# Patient Record
Sex: Female | Born: 1980 | Race: Black or African American | Hispanic: No | Marital: Married | Smoking: Never smoker
Health system: Southern US, Community
[De-identification: ages and names within clinical notes are randomized; demographics above are authoritative.]

## PROBLEM LIST (undated history)

## (undated) DIAGNOSIS — S060X9A Concussion with loss of consciousness of unspecified duration, initial encounter: Secondary | ICD-10-CM

## (undated) DIAGNOSIS — M5416 Radiculopathy, lumbar region: Secondary | ICD-10-CM

## (undated) DIAGNOSIS — F4541 Pain disorder exclusively related to psychological factors: Secondary | ICD-10-CM

## (undated) DIAGNOSIS — R519 Headache, unspecified: Secondary | ICD-10-CM

## (undated) DIAGNOSIS — S060XAA Concussion with loss of consciousness status unknown, initial encounter: Secondary | ICD-10-CM

## (undated) DIAGNOSIS — D649 Anemia, unspecified: Secondary | ICD-10-CM

## (undated) DIAGNOSIS — I2699 Other pulmonary embolism without acute cor pulmonale: Secondary | ICD-10-CM

## (undated) DIAGNOSIS — K859 Acute pancreatitis without necrosis or infection, unspecified: Secondary | ICD-10-CM

## (undated) DIAGNOSIS — Z86018 Personal history of other benign neoplasm: Secondary | ICD-10-CM

## (undated) DIAGNOSIS — K5790 Diverticulosis of intestine, part unspecified, without perforation or abscess without bleeding: Secondary | ICD-10-CM

## (undated) DIAGNOSIS — N281 Cyst of kidney, acquired: Secondary | ICD-10-CM

## (undated) HISTORY — PX: BREAST CYST EXCISION: SHX579

## (undated) HISTORY — DX: Diverticulosis of intestine, part unspecified, without perforation or abscess without bleeding: K57.90

## (undated) HISTORY — PX: ABDOMINAL HYSTERECTOMY: SHX81

## (undated) HISTORY — DX: Cyst of kidney, acquired: N28.1

## (undated) HISTORY — DX: Headache, unspecified: R51.9

## (undated) HISTORY — PX: OTHER SURGICAL HISTORY: SHX169

## (undated) HISTORY — DX: Radiculopathy, lumbar region: M54.16

---
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
## 2006-03-19 ENCOUNTER — Emergency Department (HOSPITAL_COMMUNITY): Admission: EM | Admit: 2006-03-19 | Discharge: 2006-03-19 | Payer: Self-pay | Admitting: Emergency Medicine

## 2006-03-22 IMAGING — US US OB COMP LESS 14 WK
1 series · 14 of 18 positions shown · non-contrast
Comparison: none

CLINICAL DATA: 9 weeks, unable to keep fluids down, dehydrated.
 OBSTETRICAL ULTRASOUND <14 WKS:
TECHNIQUE: Transabdominal ultrasound was performed for evaluation of the gestation as well as the maternal uterus and adnexal regions.

[Series 1: us ob comp less 14 wks · 14 of 18 slices shown]
[im 1/18]
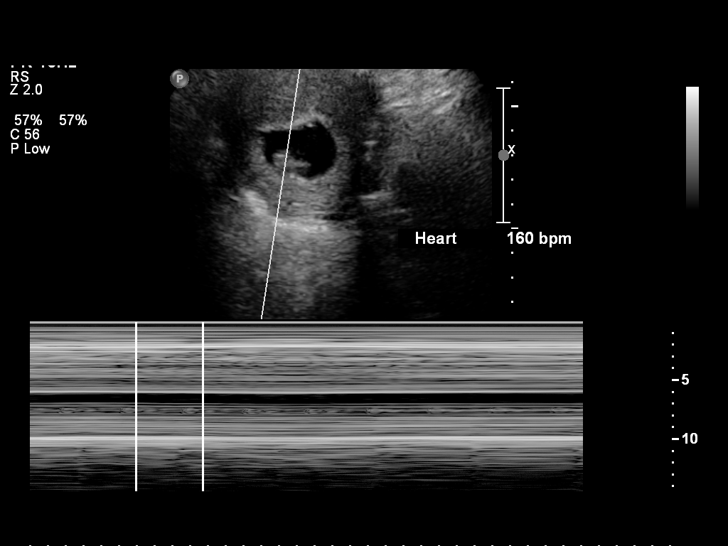
[im 2/18]
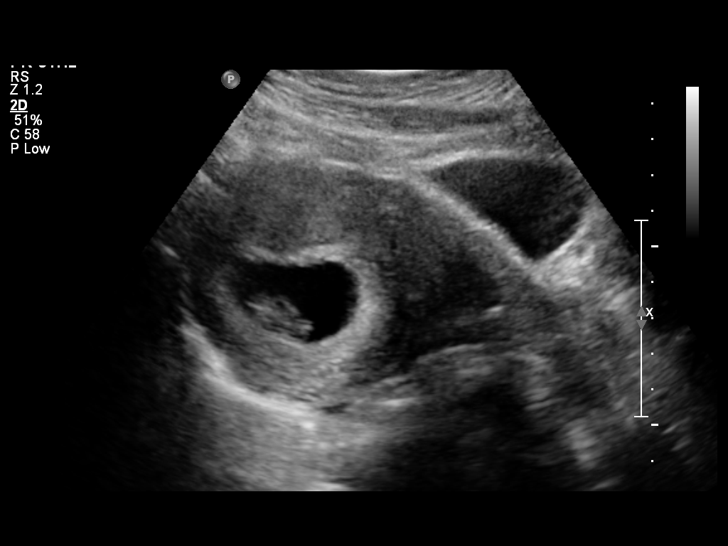
[im 4/18]
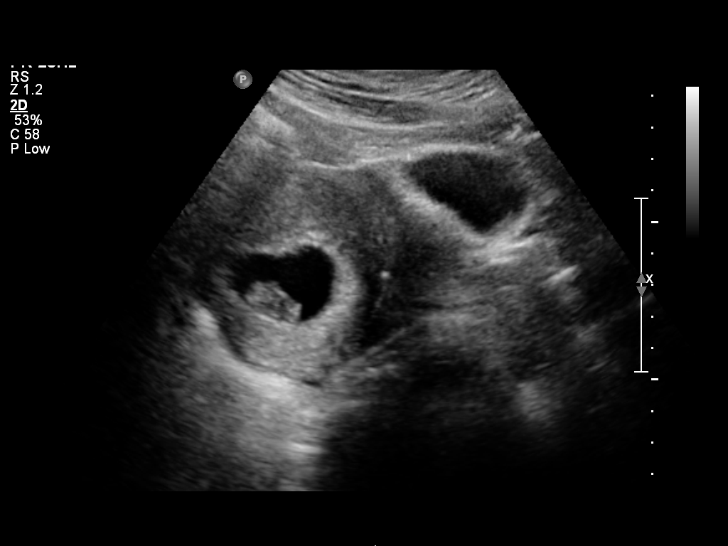
[im 5/18]
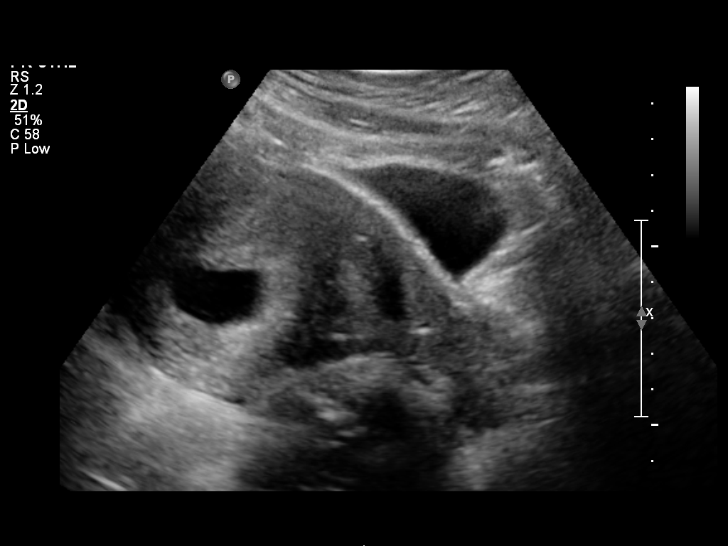
[im 6/18]
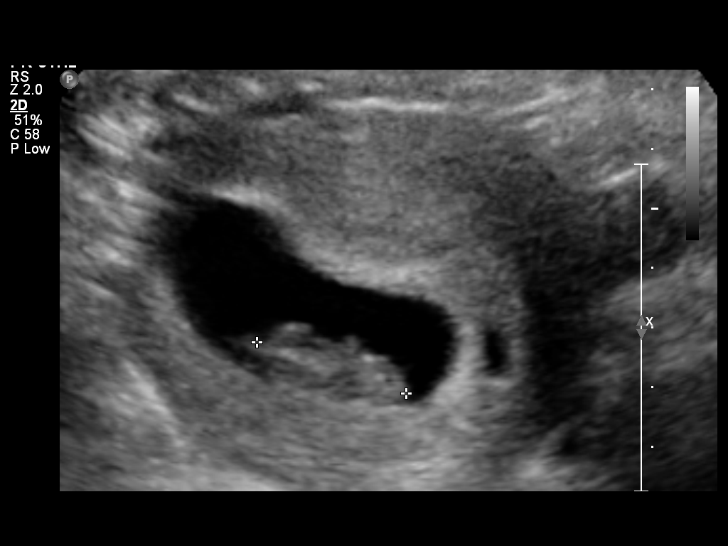
[im 8/18]
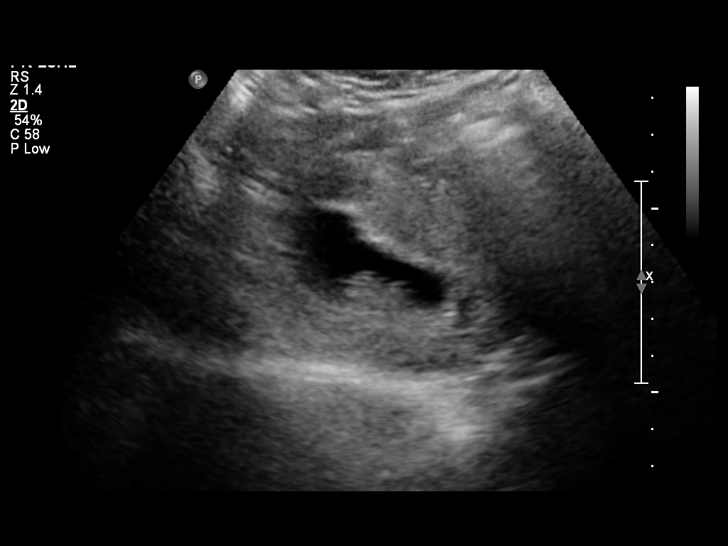
[im 9/18]
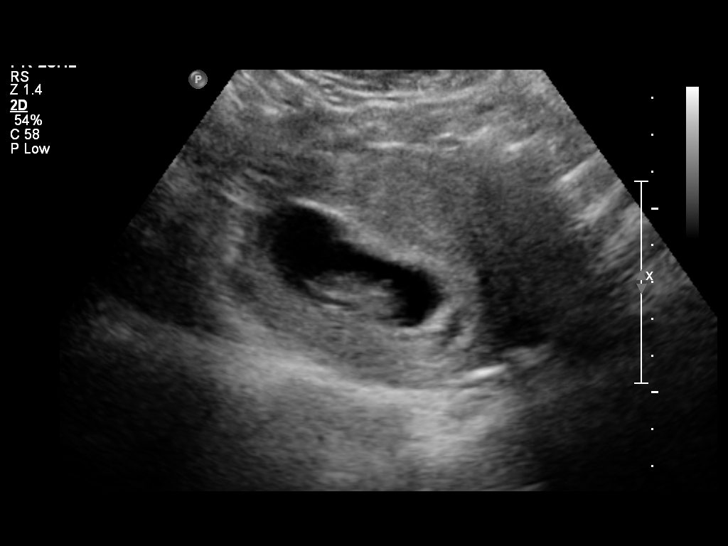
[im 10/18]
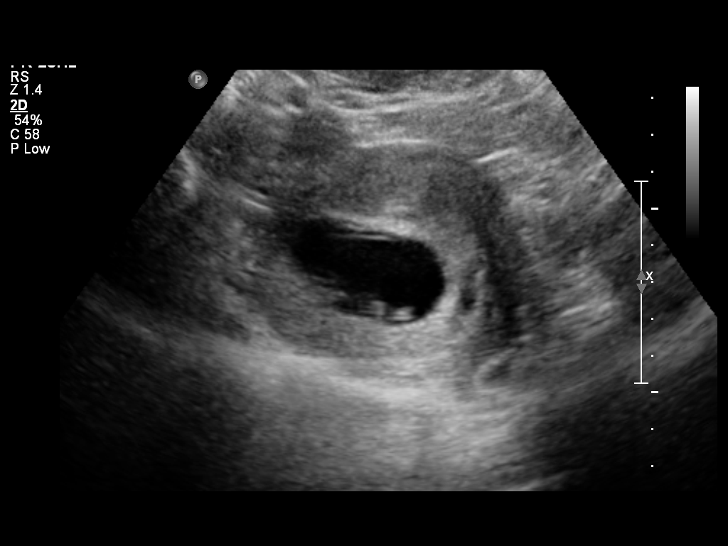
[im 11/18]
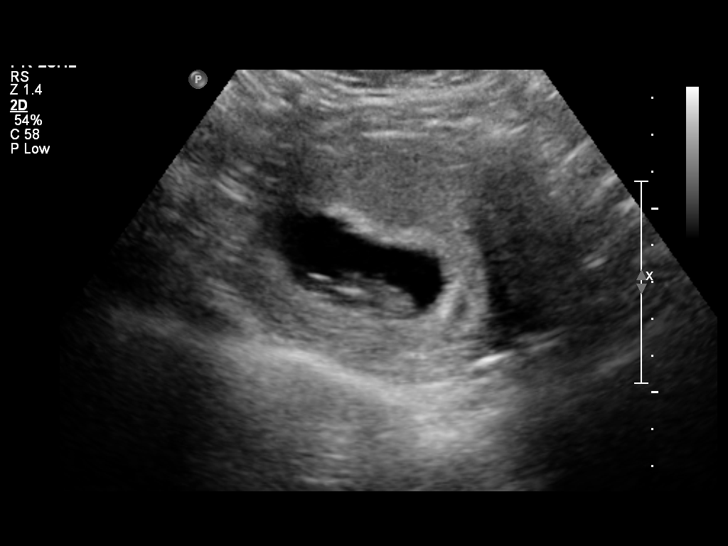
[im 13/18]
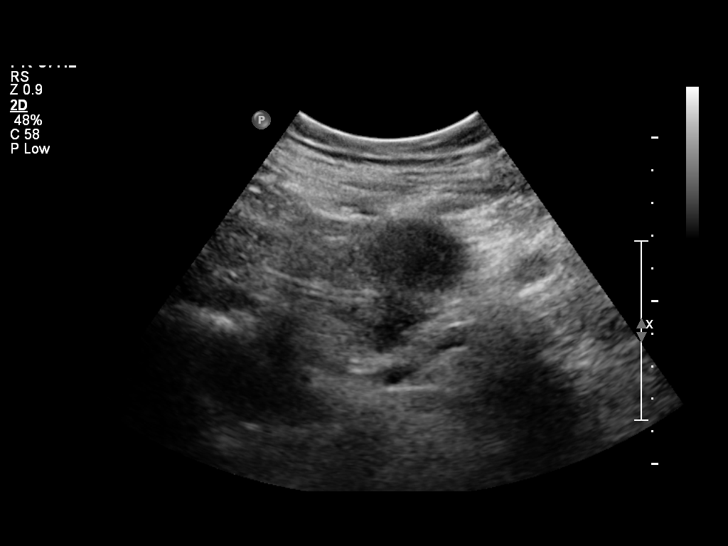
[im 14/18]
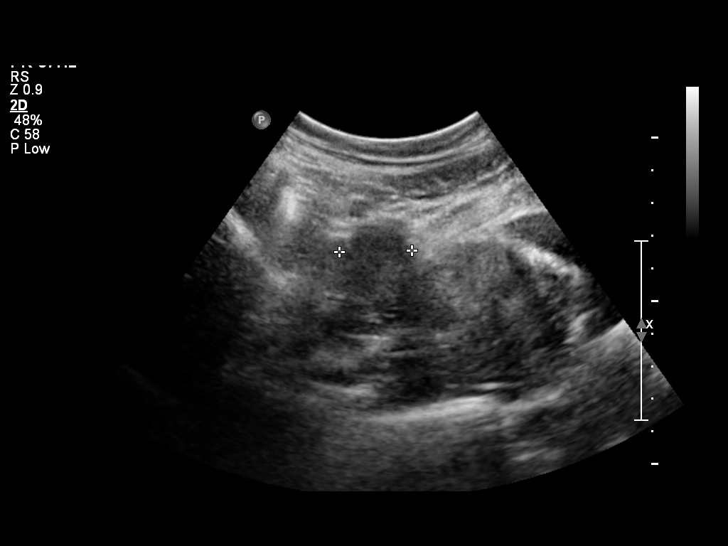
[im 15/18]
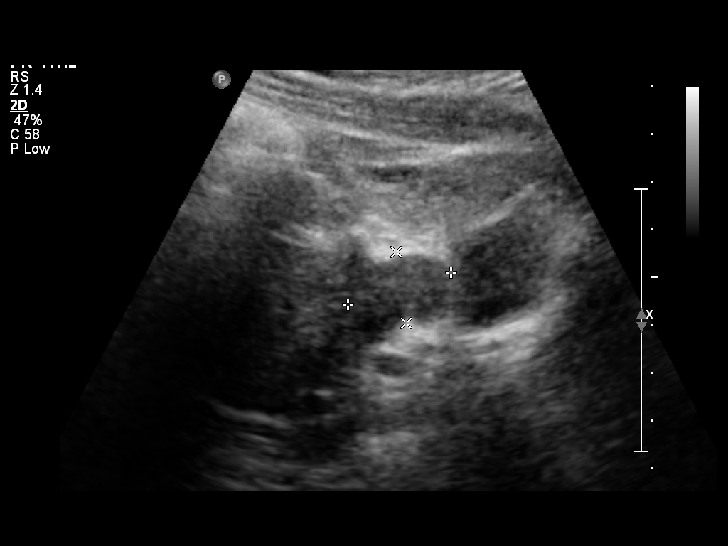
[im 17/18]
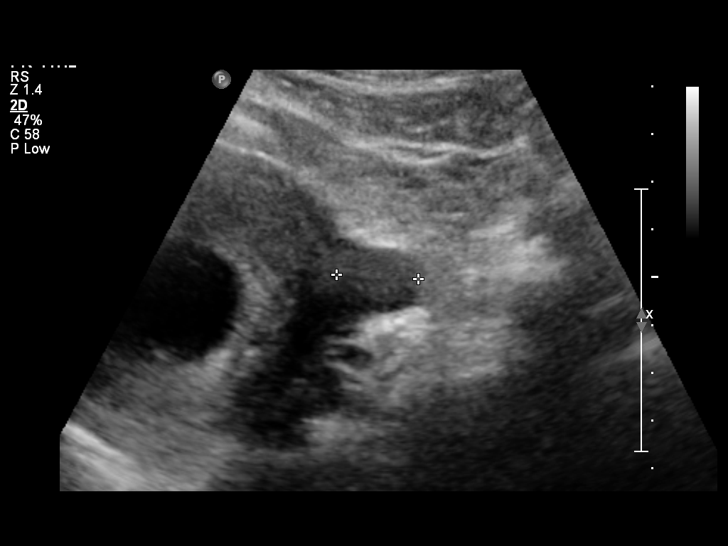
[im 18/18]
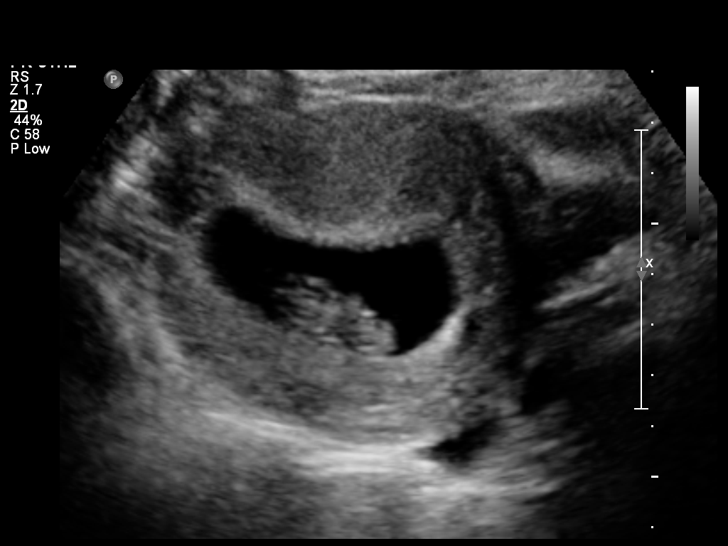

[14 of 18 positions shown; findings below may reference images not displayed]

FINDINGS: A single intrauterine gestational sac containing a fetal pole with crown-rump length 25.7 mm.  This corresponds to a 9 week 2 day gestation and an ultrasonic EDC of [DATE].  Objectively normal volume of amniotic fluid.  Fetal cardiac rate 160 BPM.  Cervix closed.
IMPRESSION: Single living intrauterine gestation.  Fetal age 9 weeks 2 days.  Ultrasonic EDC [DATE].

## 2006-03-23 ENCOUNTER — Inpatient Hospital Stay (HOSPITAL_COMMUNITY): Admission: AD | Admit: 2006-03-23 | Discharge: 2006-03-25 | Payer: Self-pay | Admitting: Obstetrics and Gynecology

## 2006-03-23 IMAGING — US US ABDOMEN COMPLETE
1 series · 13 of 25 positions shown · non-contrast
Comparison: none

CLINICAL DATA: 9 weeks estimated gestational age with dehydration and possible pyelonephritis.  Nausea and vomiting.
 ABDOMEN ULTRASOUND:
TECHNIQUE: Complete abdominal ultrasound examination was performed including evaluation of the liver, gallbladder, bile ducts, pancreas, kidneys, spleen, IVC, and abdominal aorta.

[Series 1: us abdomen complete · 0.16mm/px · 13 of 77 slices shown]
[im 1/77]
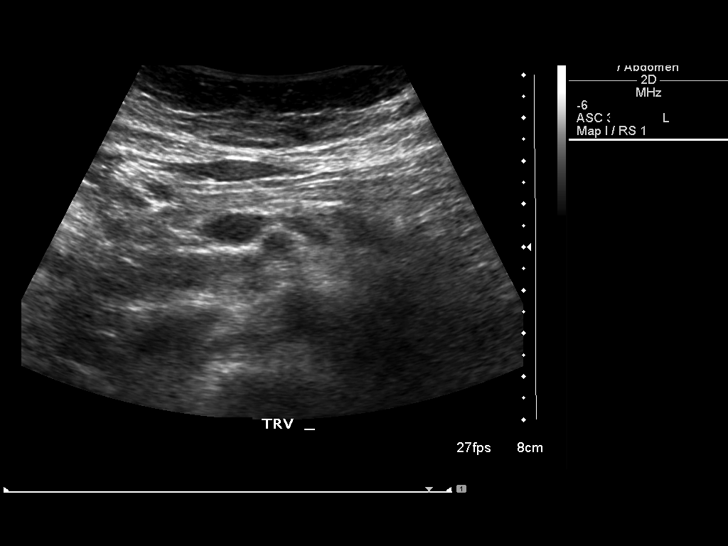
[im 7/77]
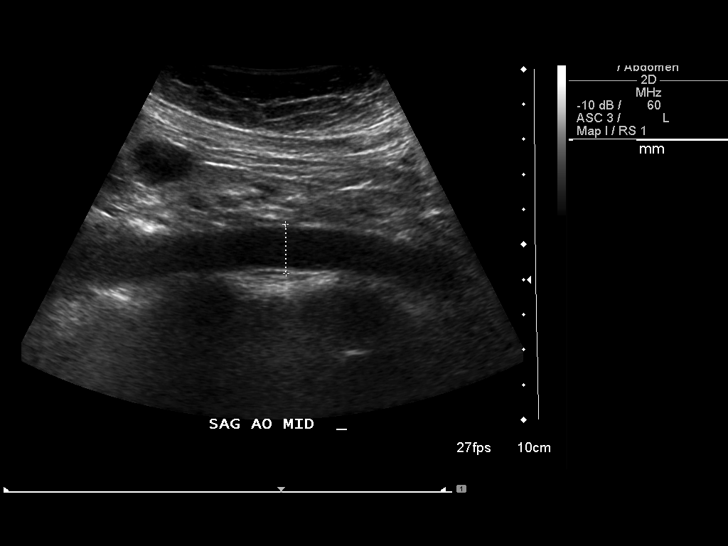
[im 13/77]
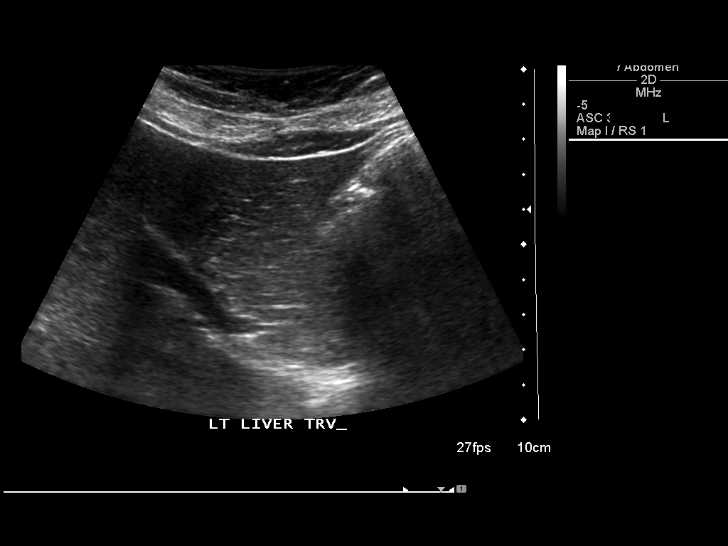
[im 20/77]
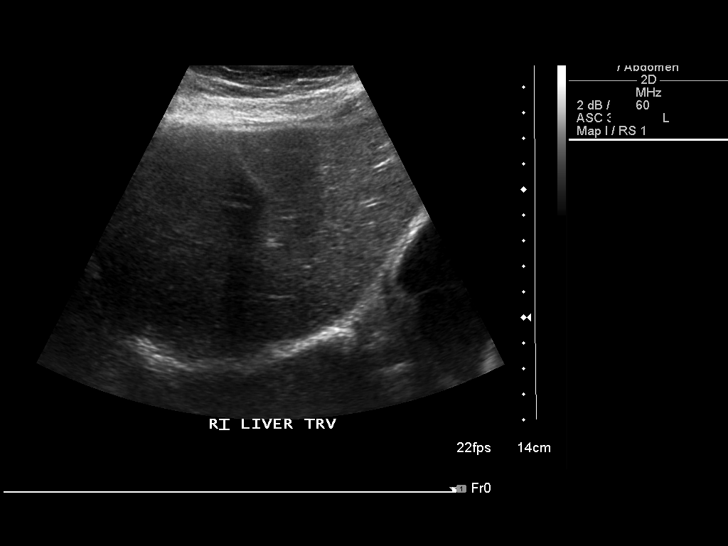
[im 26/77]
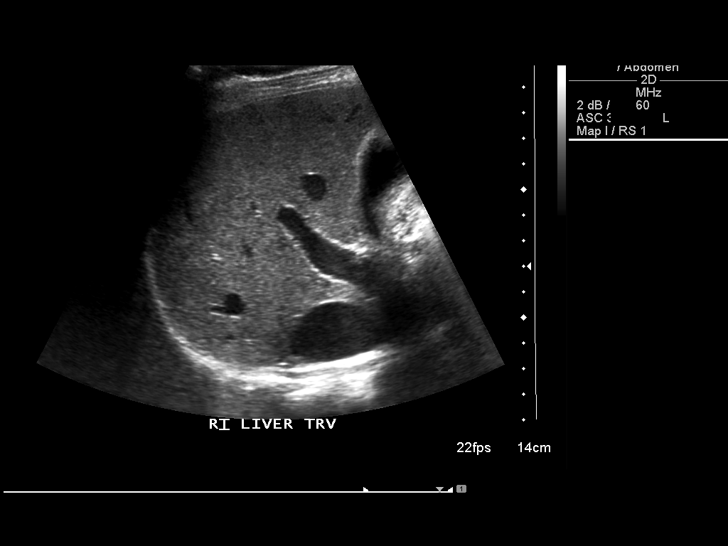
[im 32/77]
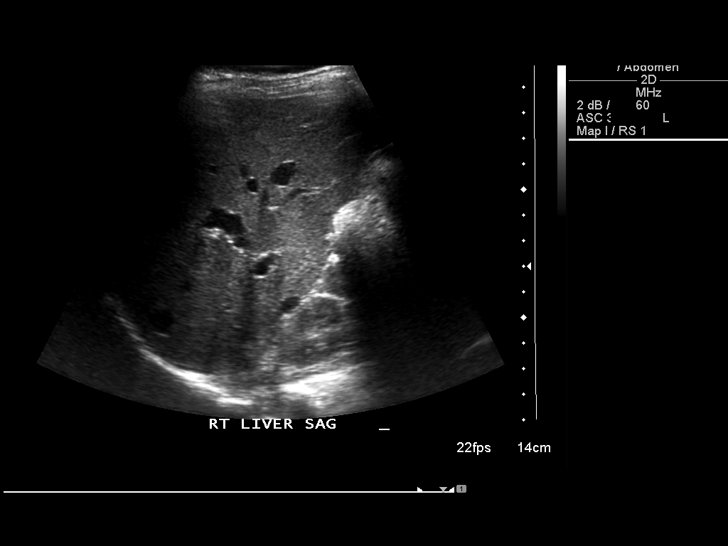
[im 39/77]
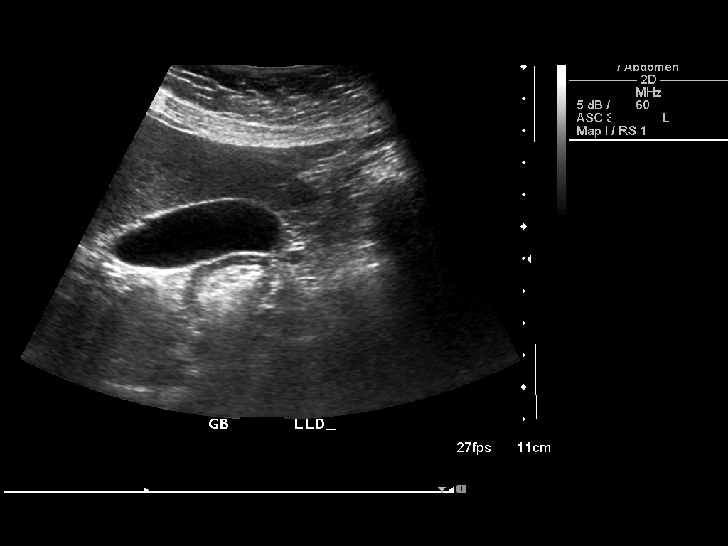
[im 45/77]
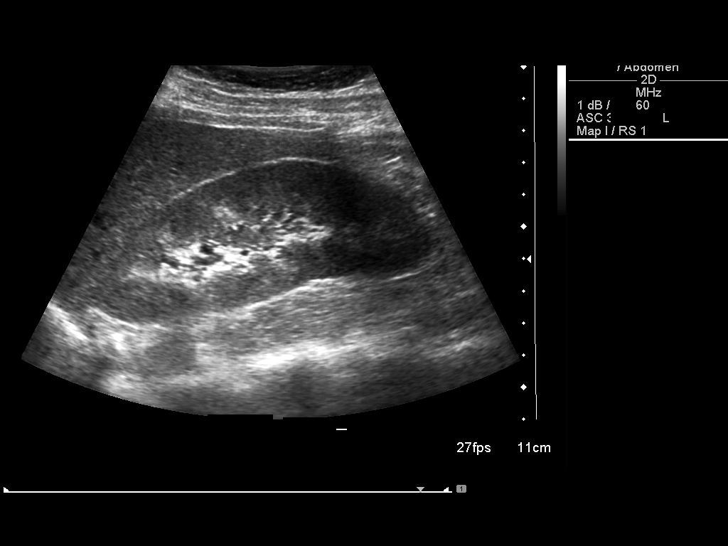
[im 51/77]
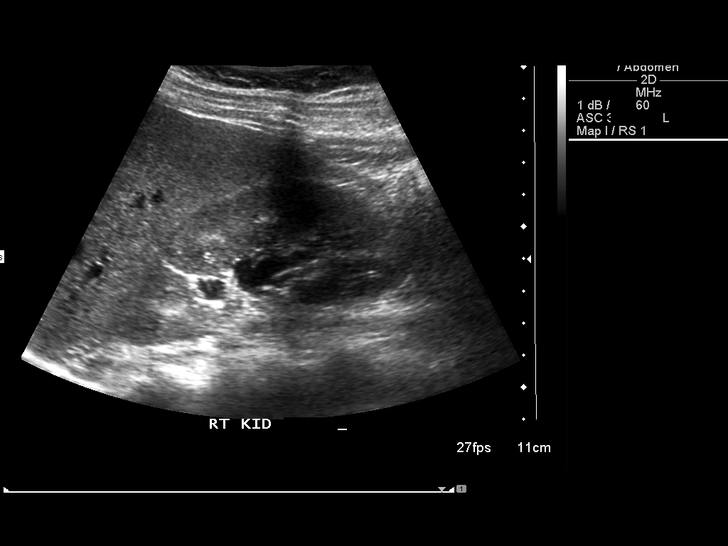
[im 58/77]
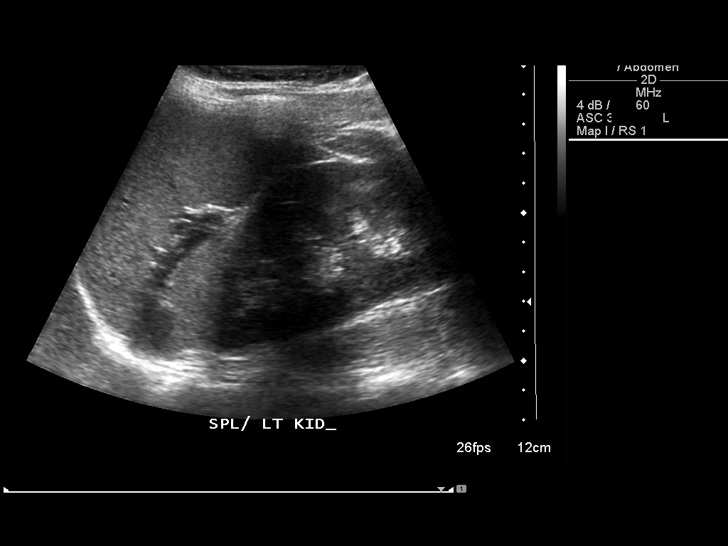
[im 64/77]
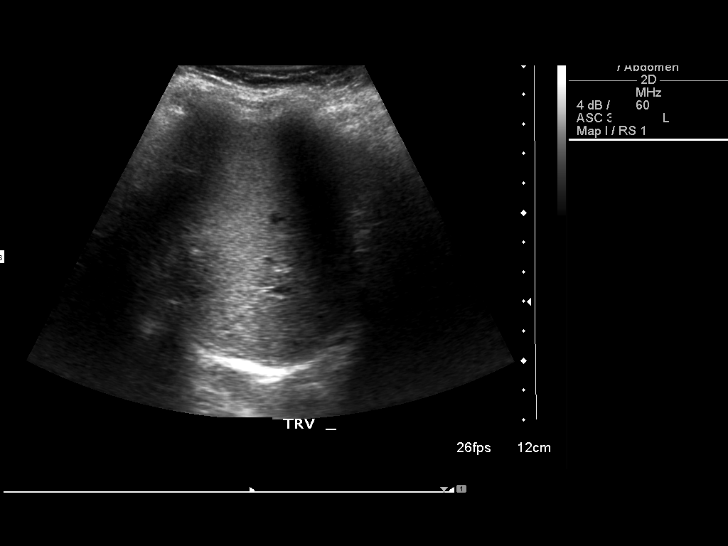
[im 70/77]
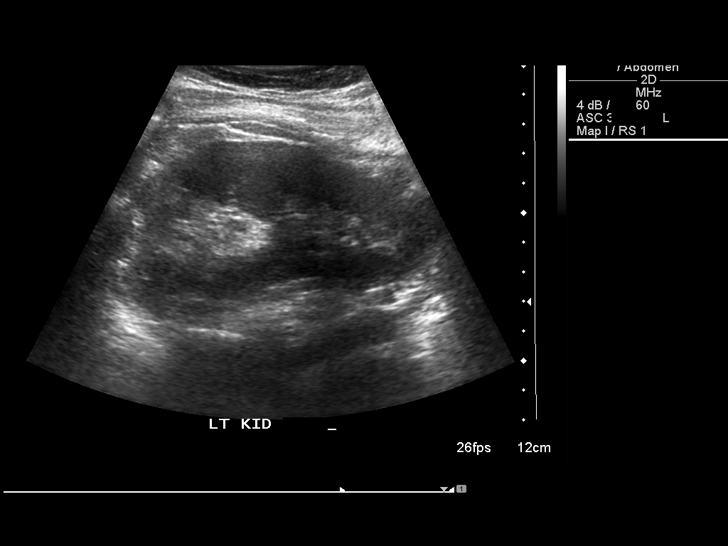
[im 77/77]
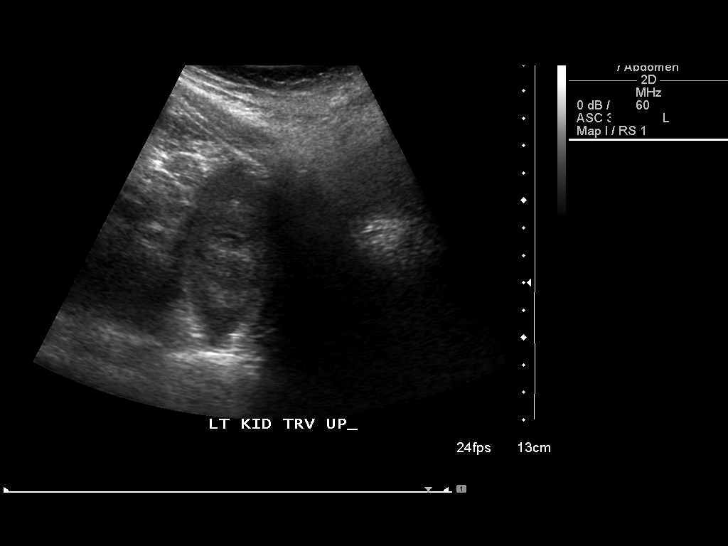

[13 of 25 positions shown; findings below may reference images not displayed]

FINDINGS: The liver parenchyma is homogeneous in echotexture without evidence for focal parenchymal abnormality or intrahepatic ductal dilatation.  The gallbladder is well distended and shows no evidence for intraluminal stones or sludge.  No pericholecystic fluid or gallbladder wall thickening is seen.  The common bile duct measures 4.7 mm in AP width, and this is within normal limits for size.  
 Both kidneys have a normal appearance with the right kidney having a sagittal length of 10.7 cm and the left kidney having a sagittal length of 10.3 cm.  No focal parenchymal abnormalities or signs of hydronephrosis are evident.  The spleen is normal in size and echotexture.  The pancreatic tail is incompletely visualized due to shadowing from overlying bowel gas.  The remainder of the pancreas remains within normal limits.  No evidence for intraabdominal fluid is seen.  The abdominal aorta is normal in caliber and the proximal inferior vena cava is unremarkable.
IMPRESSION: Incomplete visualization of the pancreatic tail.  Otherwise normal abdominal ultrasound.

## 2006-07-24 ENCOUNTER — Ambulatory Visit (HOSPITAL_COMMUNITY): Admission: RE | Admit: 2006-07-24 | Discharge: 2006-07-24 | Payer: Self-pay | Admitting: Obstetrics and Gynecology

## 2006-07-24 IMAGING — US US OB DETAIL+14 WK
1 series · 14 of 28 positions shown · non-contrast
Comparison: none

OBSTETRICAL ULTRASOUND:
 This ultrasound was performed in The [HOSPITAL], and the [facility] report will be stored to [REDACTED] PACS.

[Series 1: us ob detail+14 wk · 14 of 71 slices shown]
[im 3/71]
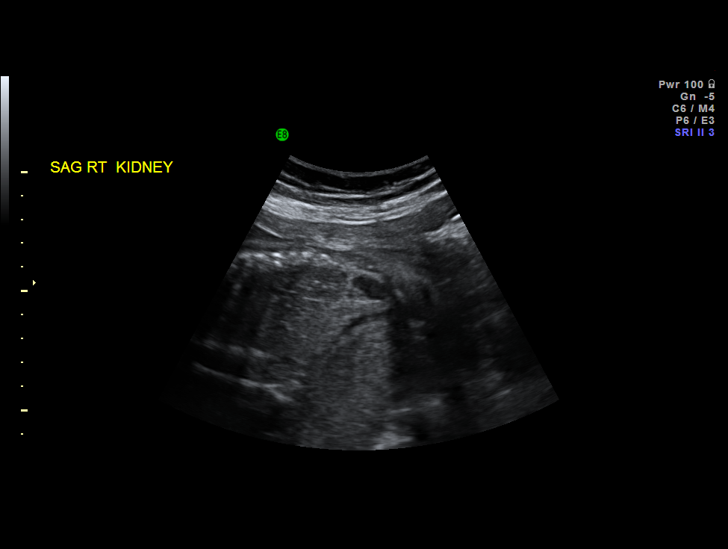
[im 8/71]
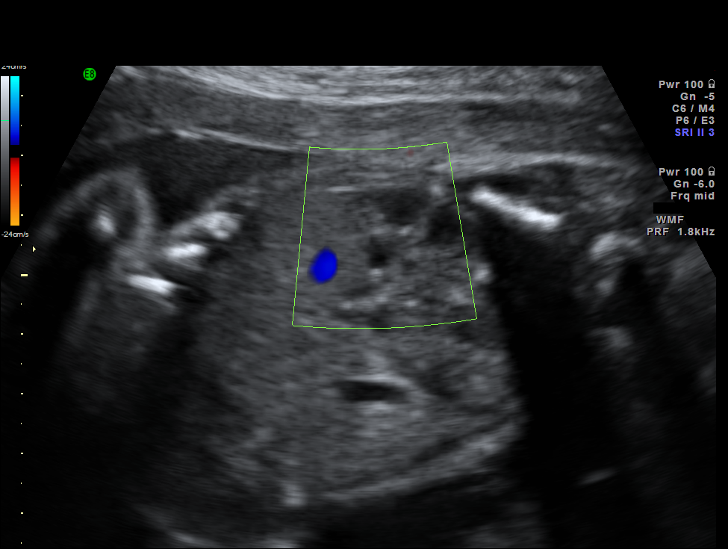
[im 13/71]
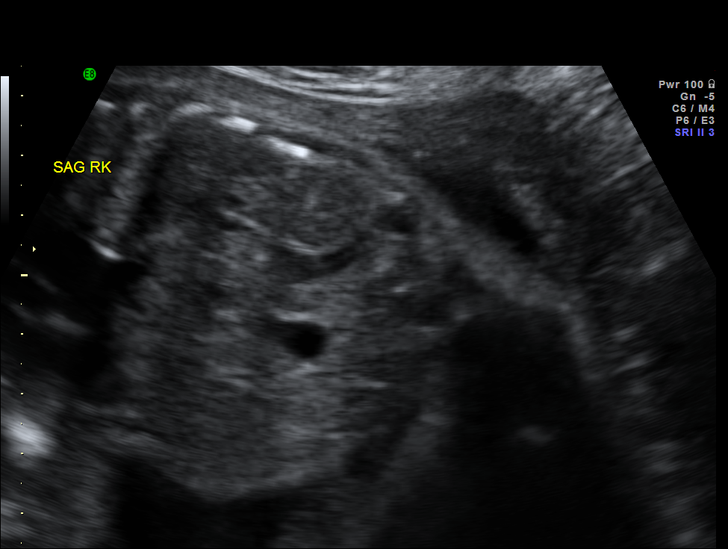
[im 19/71]
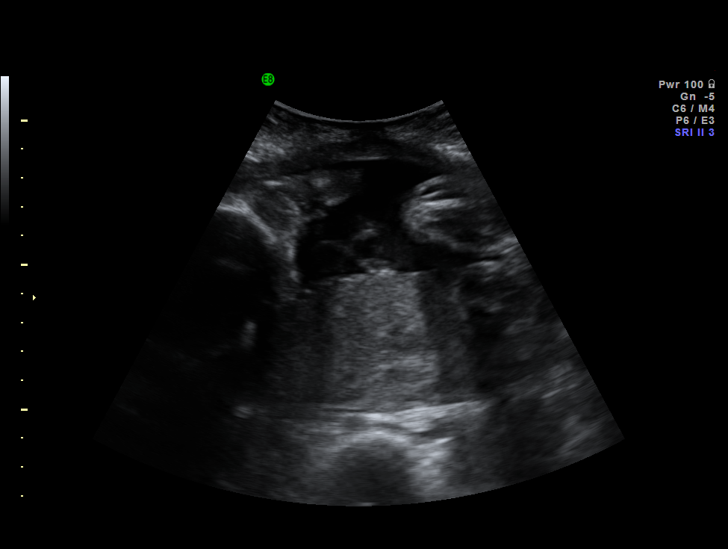
[im 24/71]
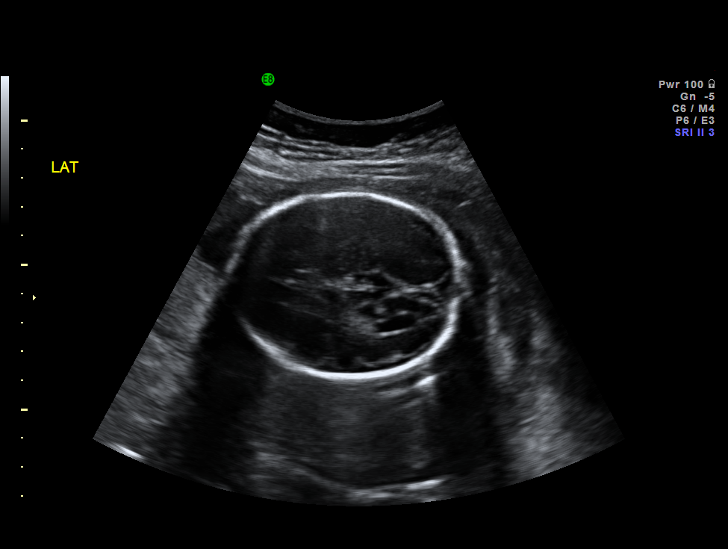
[im 29/71]
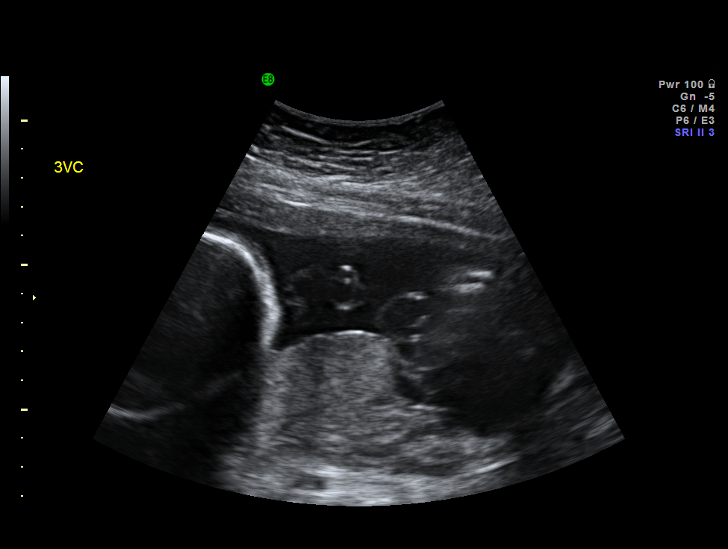
[im 34/71]
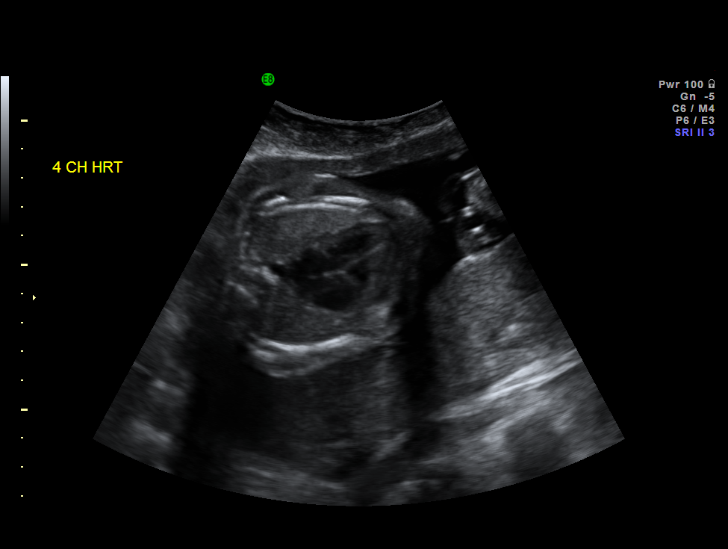
[im 39/71]
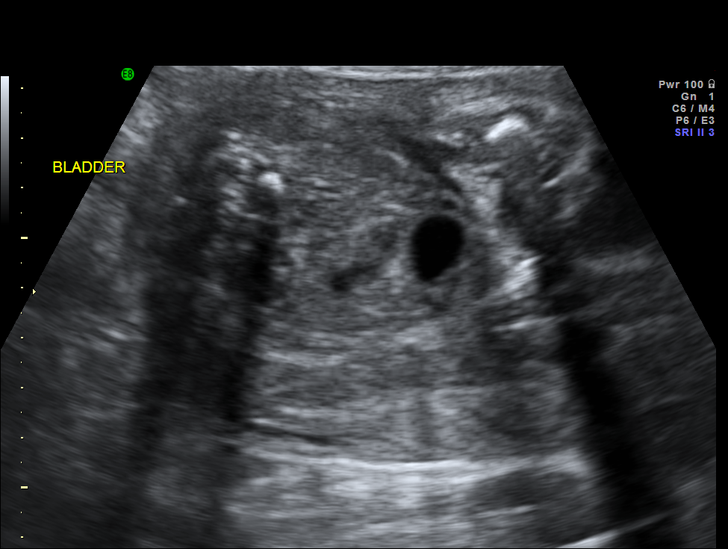
[im 45/71]
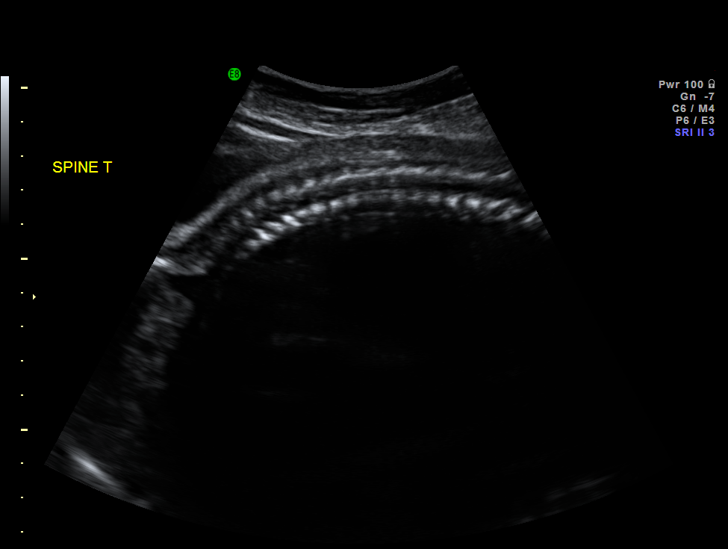
[im 50/71]
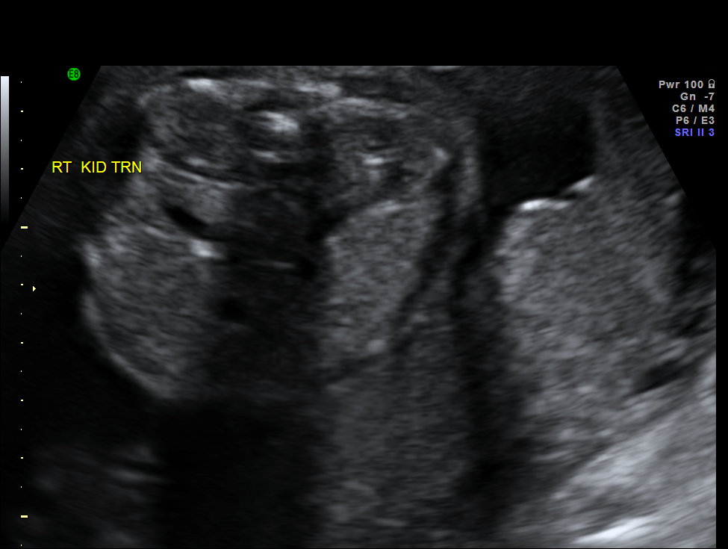
[im 55/71]
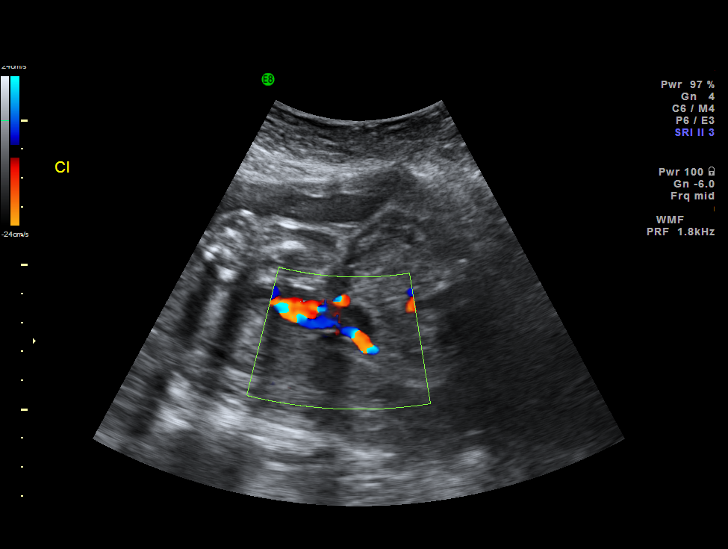
[im 60/71]
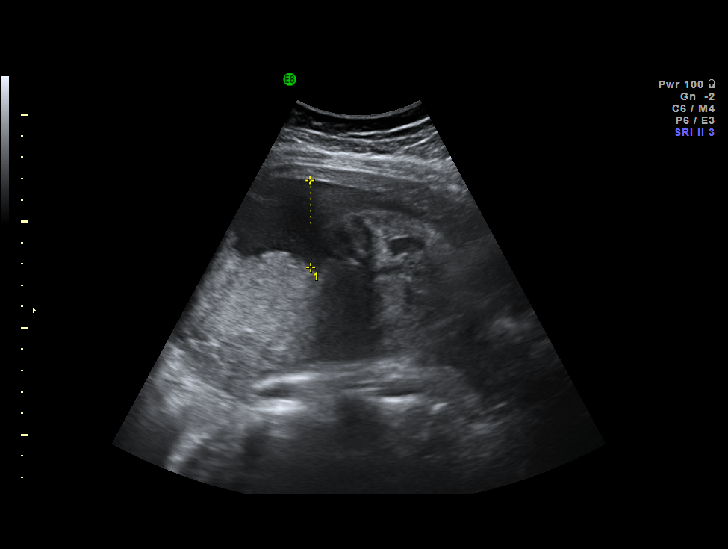
[im 65/71]
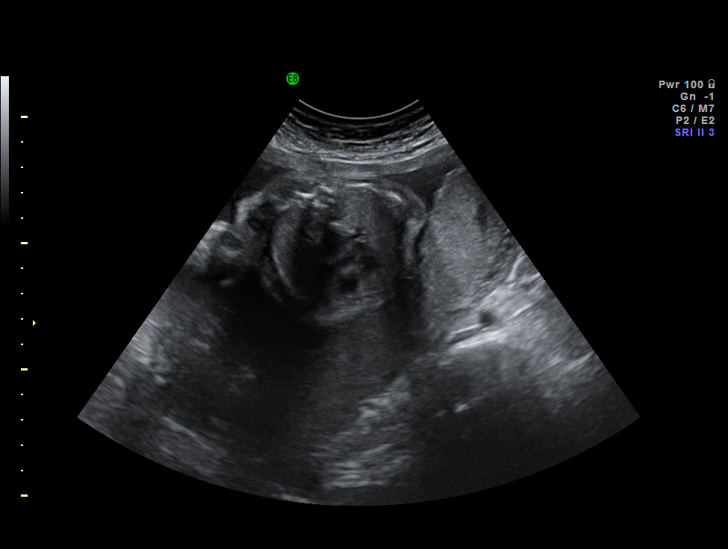
[im 71/71]
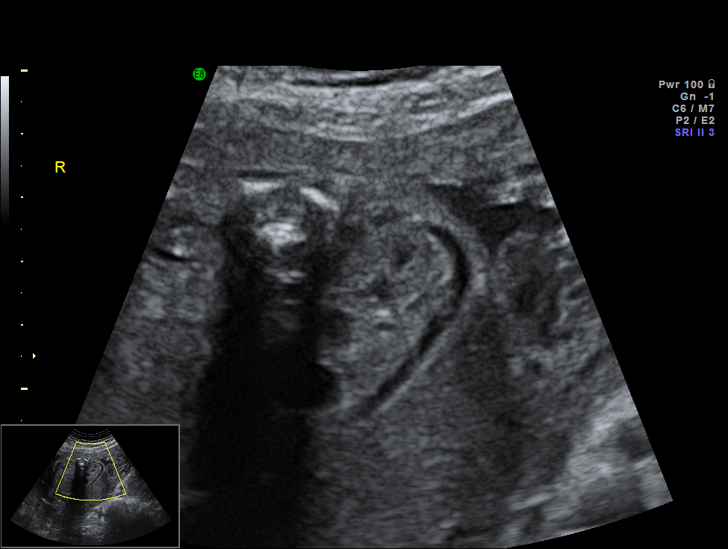

[14 of 28 positions shown; findings below may reference images not displayed]

IMPRESSION: The [facility] report has also been faxed to the ordering physician.

## 2006-08-22 ENCOUNTER — Ambulatory Visit (HOSPITAL_COMMUNITY): Admission: RE | Admit: 2006-08-22 | Discharge: 2006-08-22 | Payer: Self-pay | Admitting: Obstetrics and Gynecology

## 2006-08-22 IMAGING — US US OB FOLLOW-UP
1 series · 14 of 28 positions shown · non-contrast
Comparison: none

OBSTETRICAL ULTRASOUND:
 This ultrasound was performed in The [HOSPITAL], and the [facility] report will be stored to [REDACTED] PACS.

[Series 1: us ob follow-up · 14 of 46 slices shown]
[im 2/46]
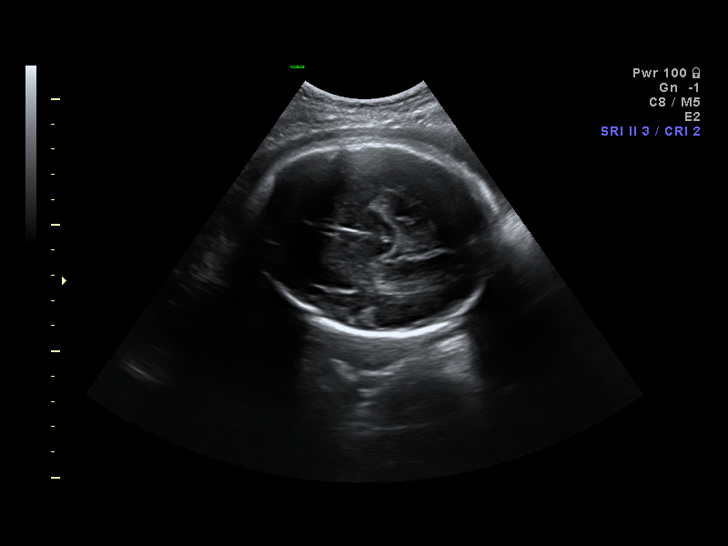
[im 6/46]
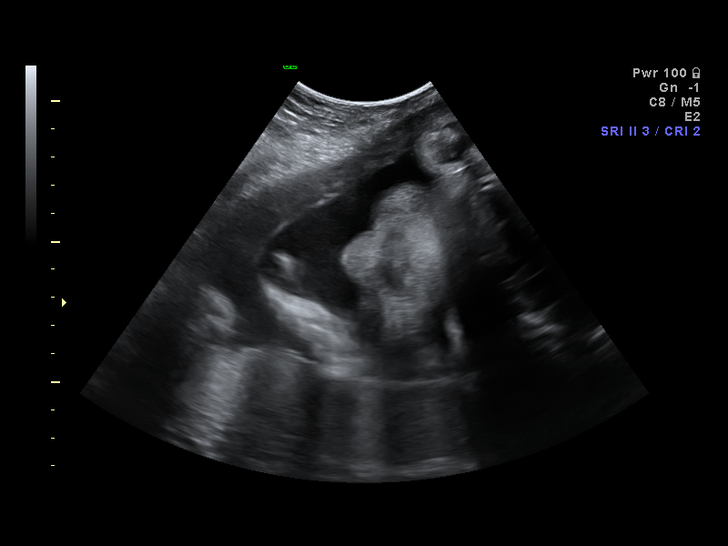
[im 9/46]
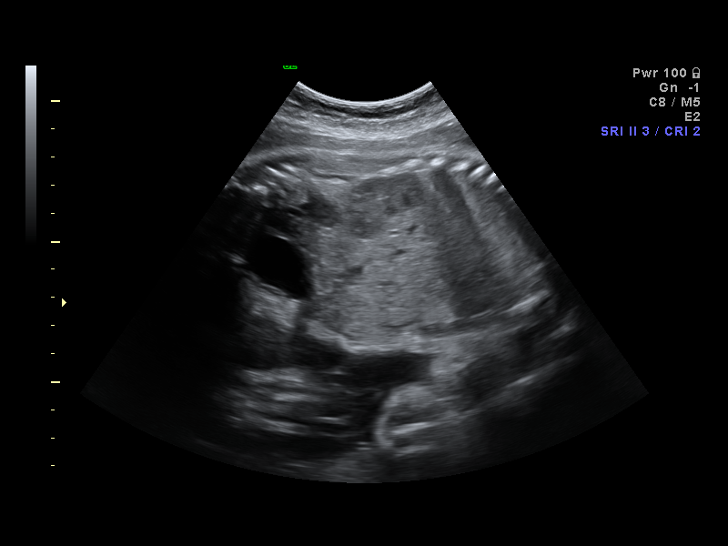
[im 12/46]
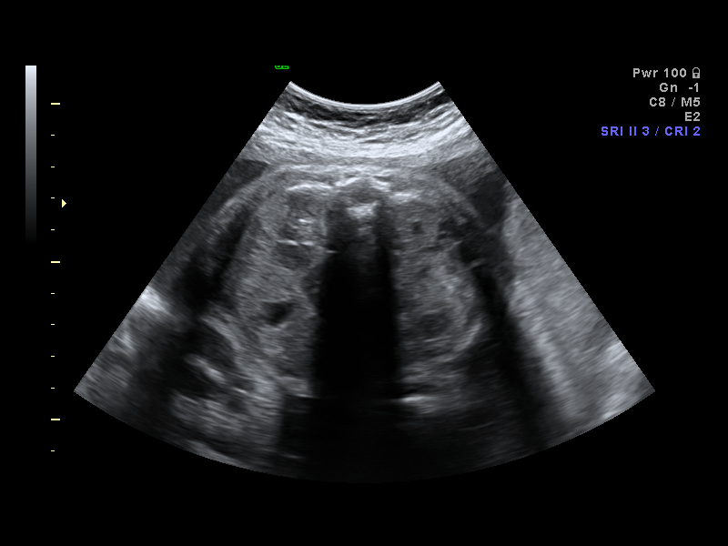
[im 16/46]
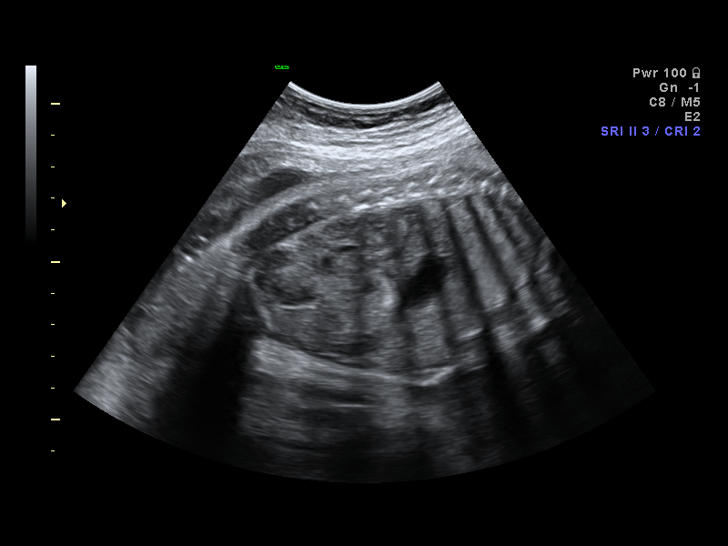
[im 19/46]
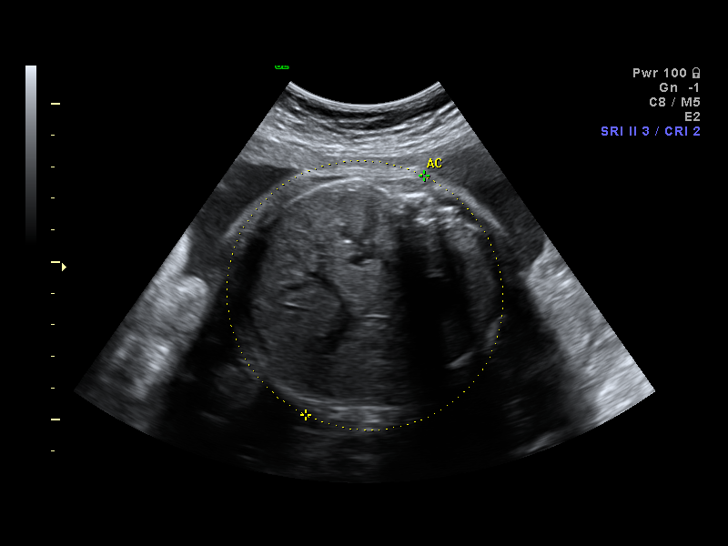
[im 22/46]
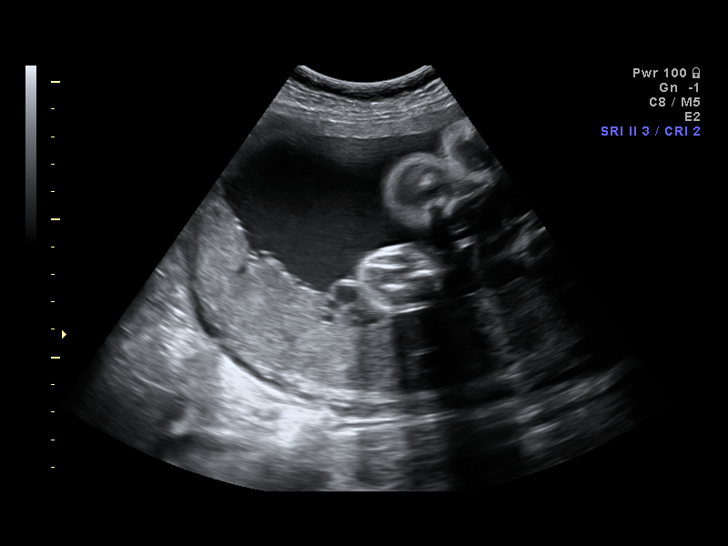
[im 26/46]
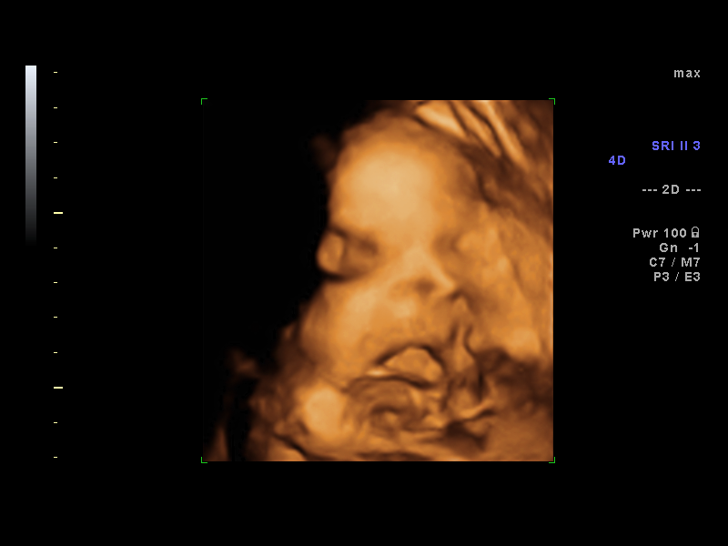
[im 29/46]
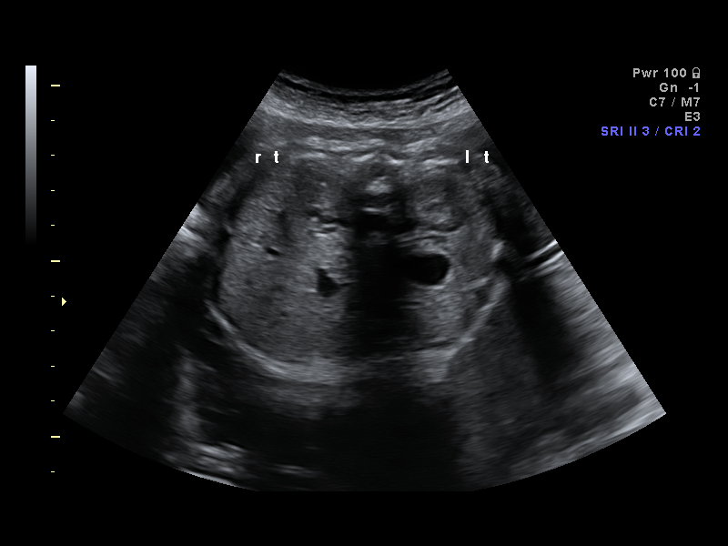
[im 32/46]
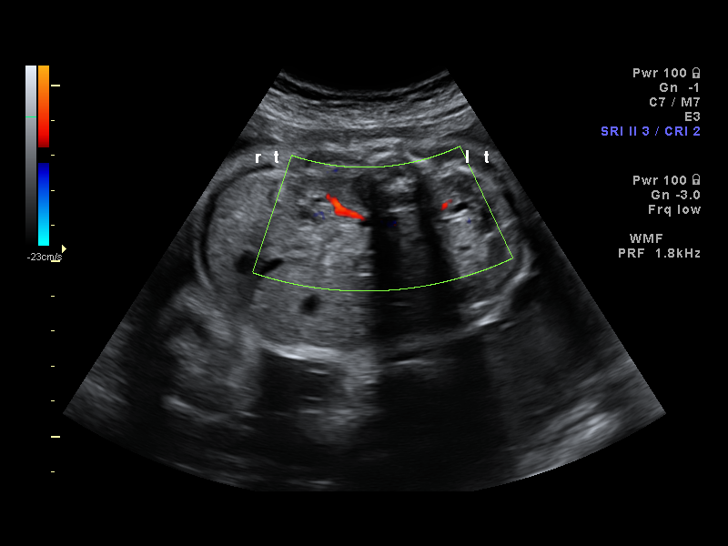
[im 36/46]
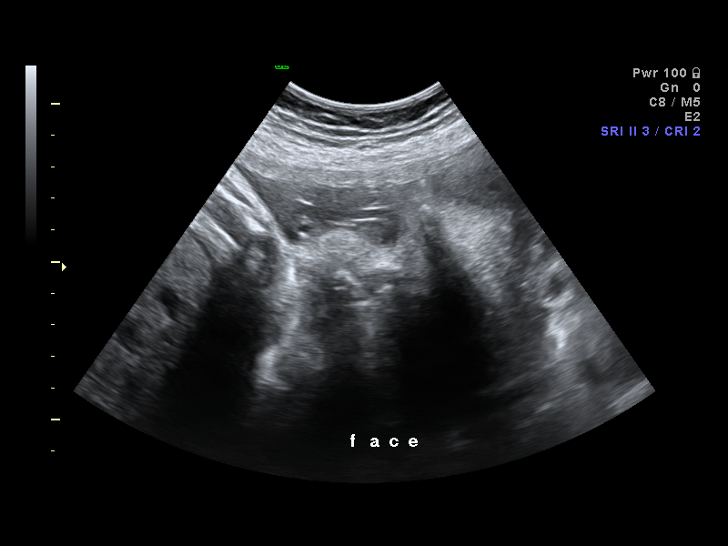
[im 39/46]
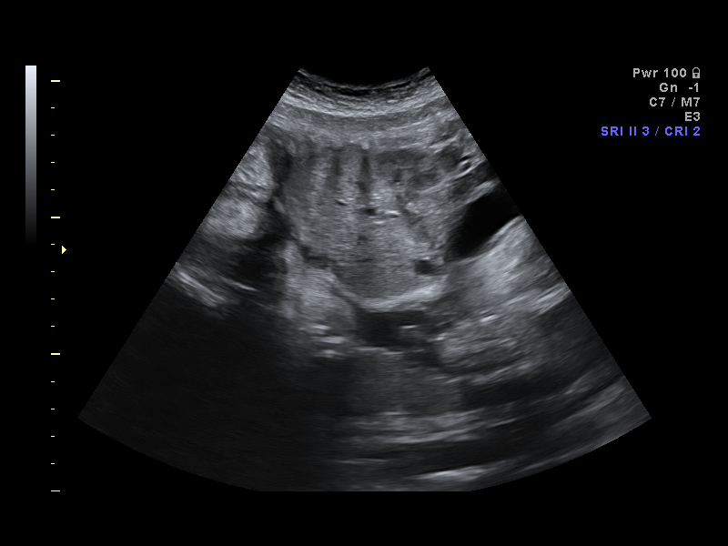
[im 42/46]
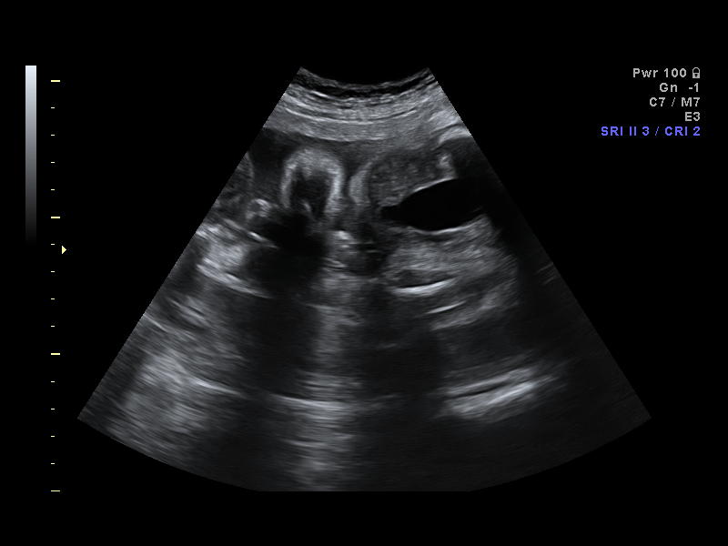
[im 46/46]
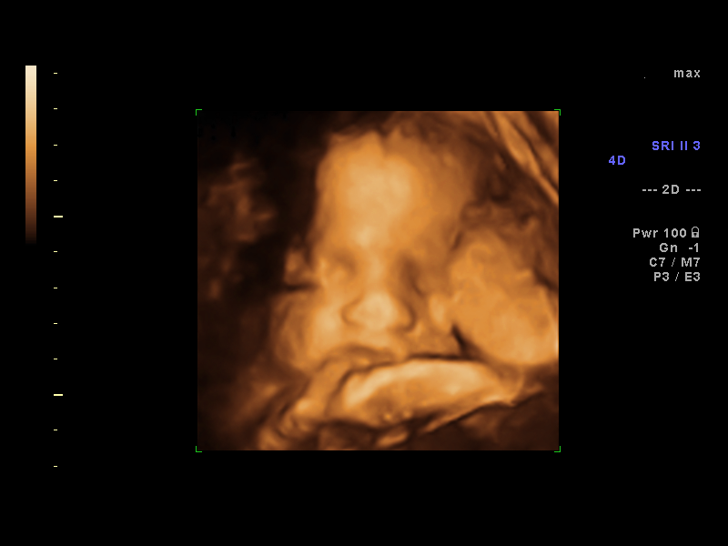

[14 of 28 positions shown; findings below may reference images not displayed]

IMPRESSION: The [facility] report has also been faxed to the ordering physician.

## 2006-09-02 ENCOUNTER — Inpatient Hospital Stay (HOSPITAL_COMMUNITY): Admission: AD | Admit: 2006-09-02 | Discharge: 2006-09-02 | Payer: Self-pay | Admitting: Obstetrics and Gynecology

## 2006-09-19 ENCOUNTER — Ambulatory Visit (HOSPITAL_COMMUNITY): Admission: RE | Admit: 2006-09-19 | Discharge: 2006-09-19 | Payer: Self-pay | Admitting: Obstetrics and Gynecology

## 2006-09-19 IMAGING — US US OB FOLLOW-UP
1 series · 14 of 25 positions shown · non-contrast
Comparison: none

OBSTETRICAL ULTRASOUND:
 This ultrasound was performed in The [HOSPITAL], and the [facility] report will be stored to [REDACTED] PACS.

[Series 1: us ob follow-up · 14 of 25 slices shown]
[im 1/25]
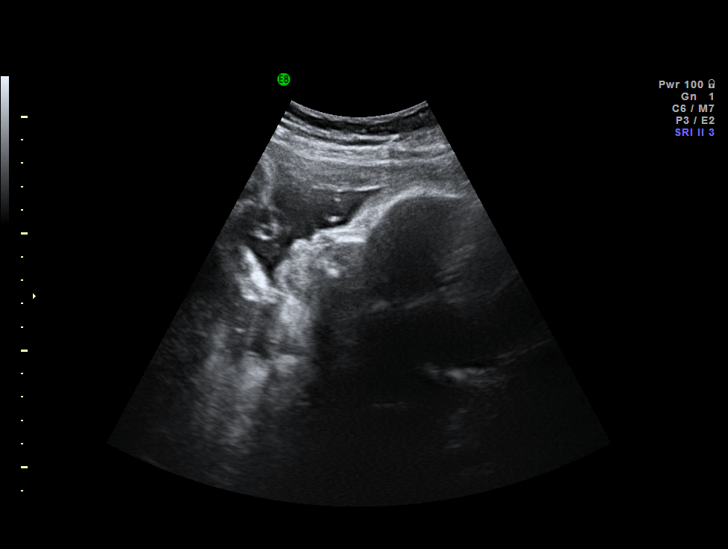
[im 3/25]
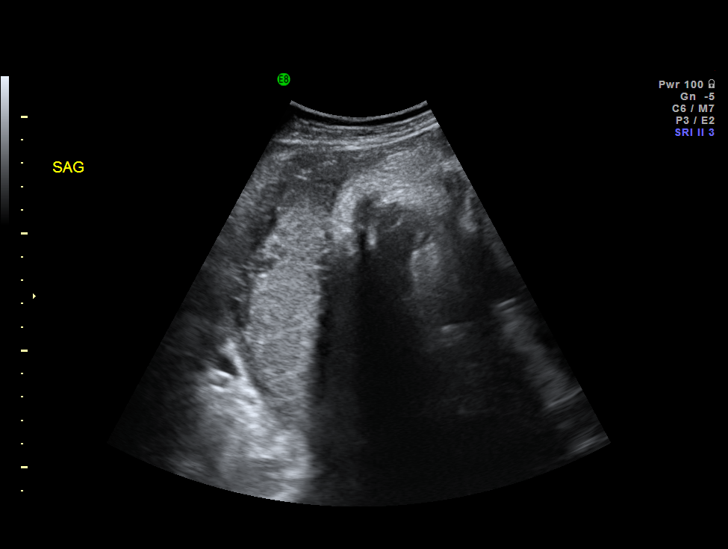
[im 5/25]
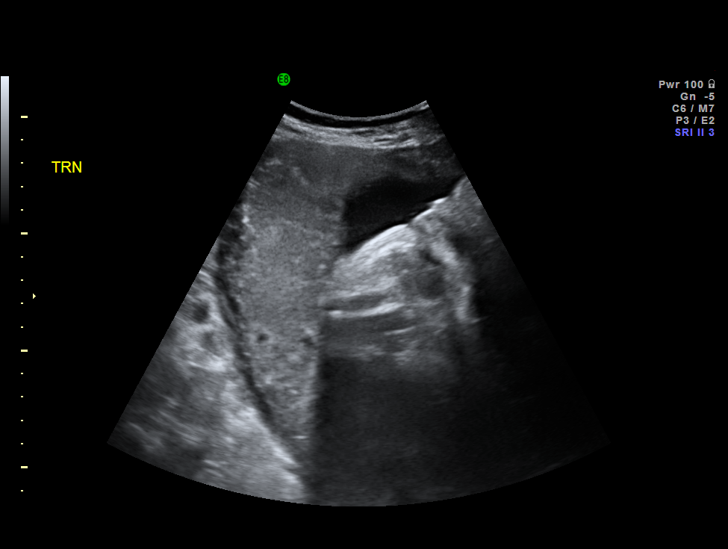
[im 7/25]
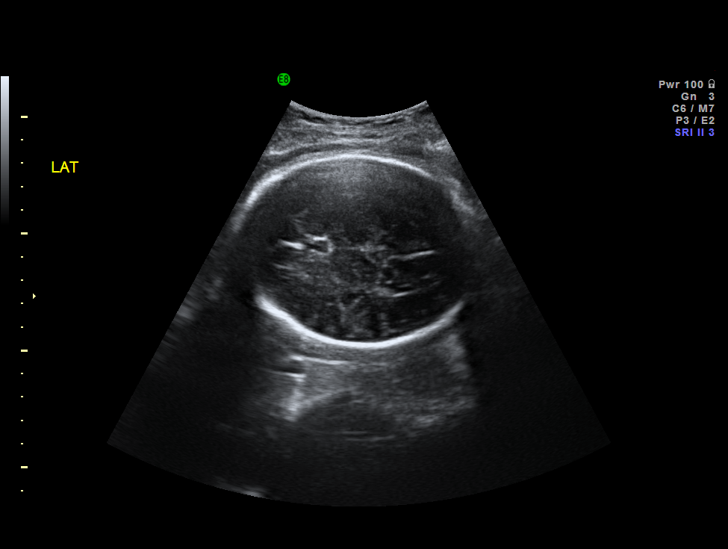
[im 9/25]
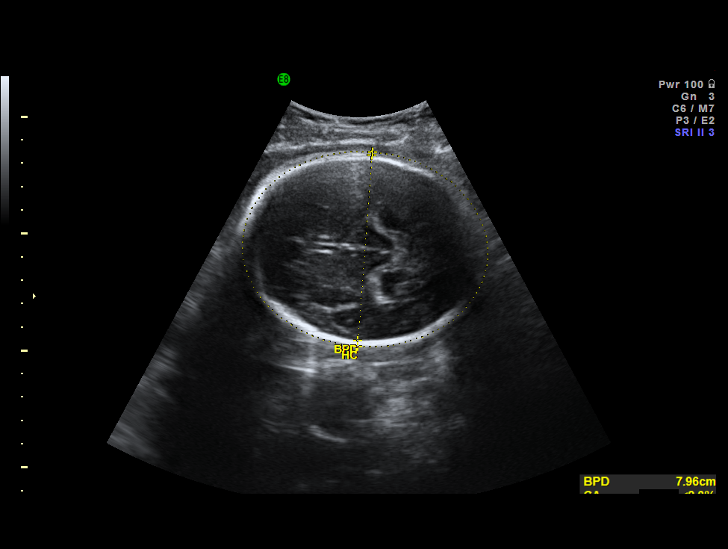
[im 10/25]
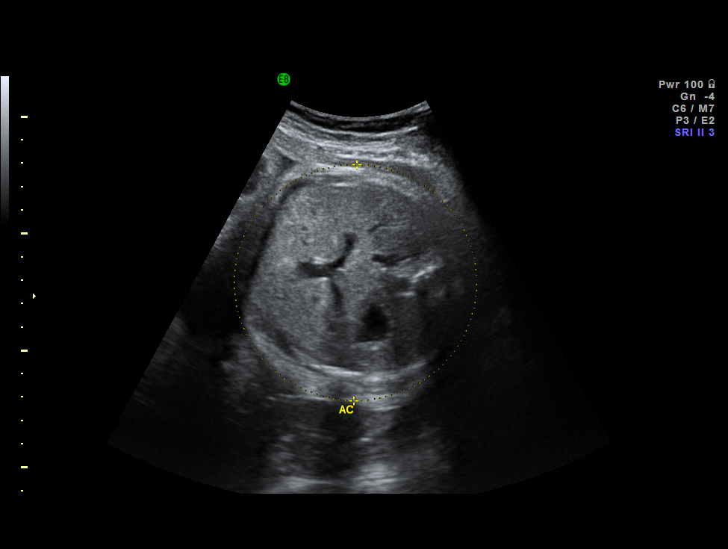
[im 12/25]
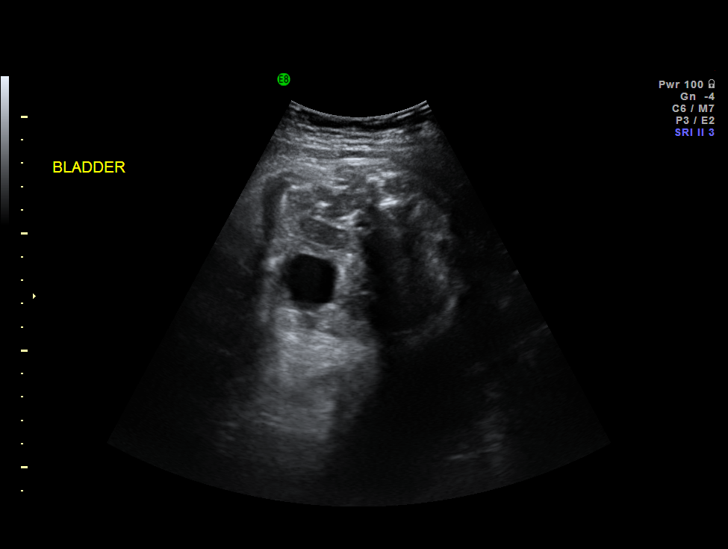
[im 14/25]
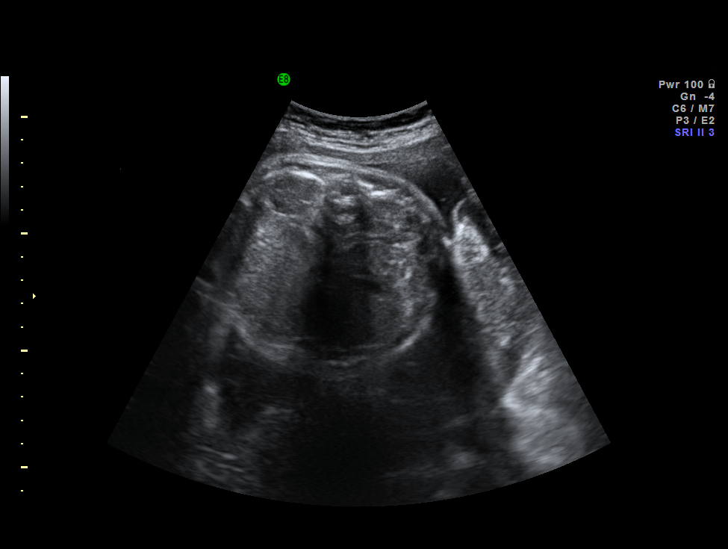
[im 16/25]
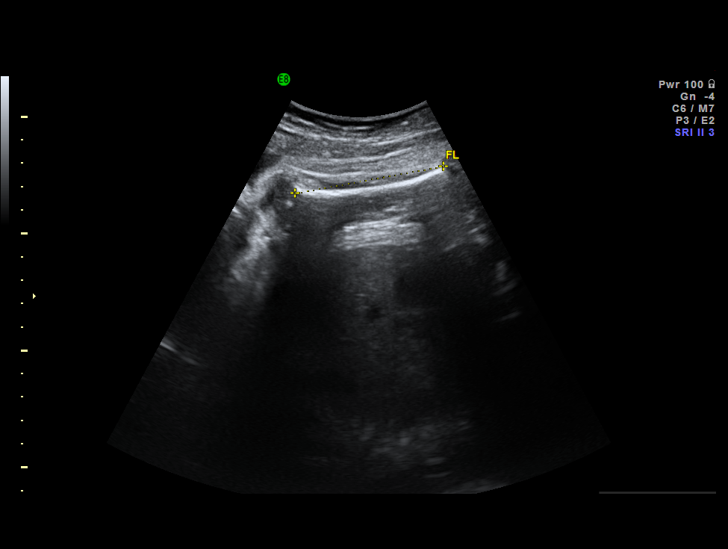
[im 17/25]
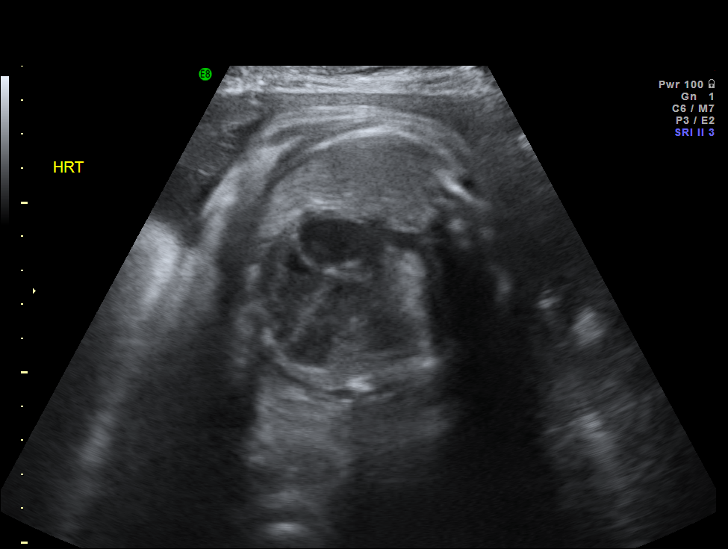
[im 19/25]
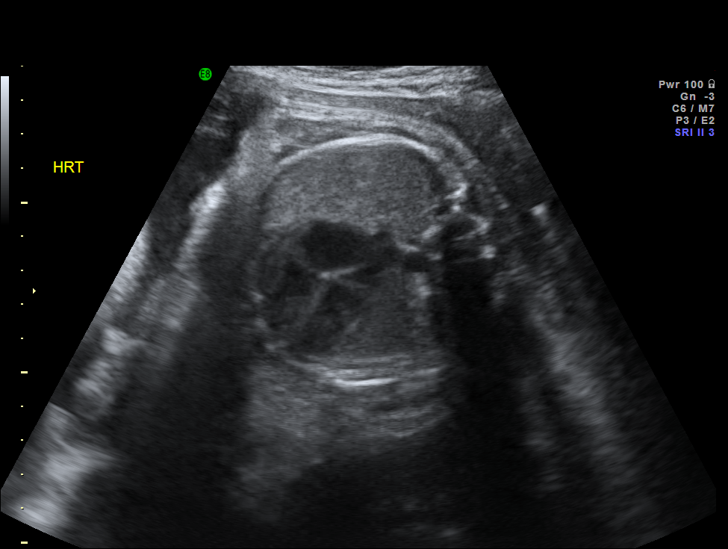
[im 21/25]
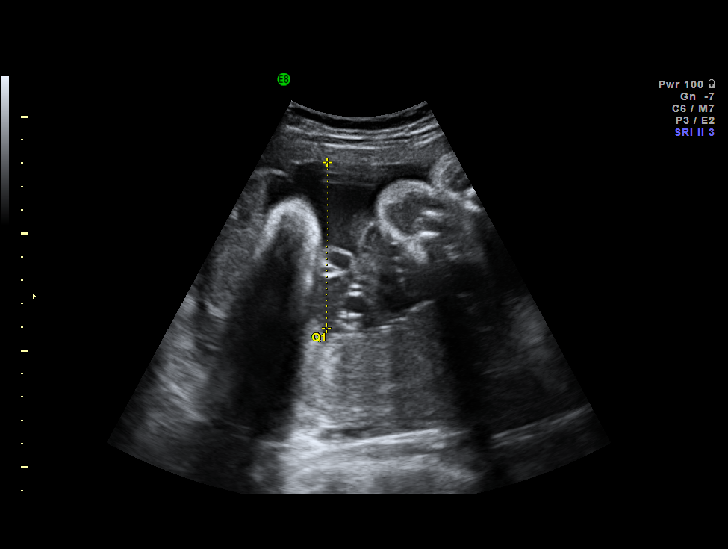
[im 23/25]
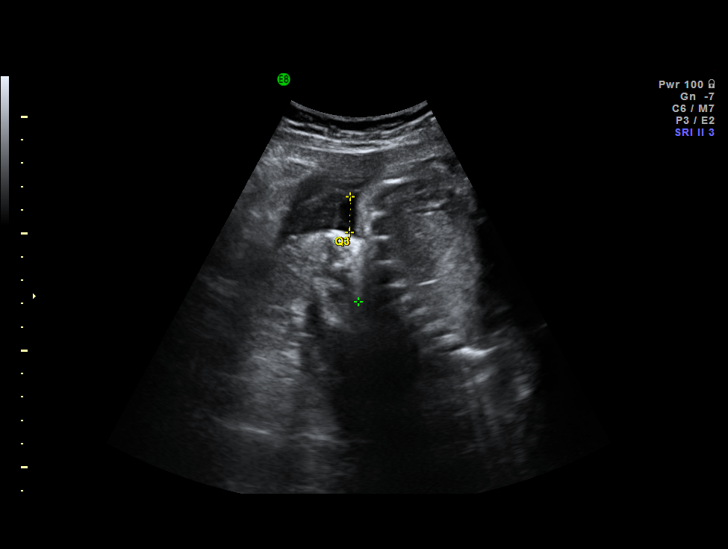
[im 25/25]
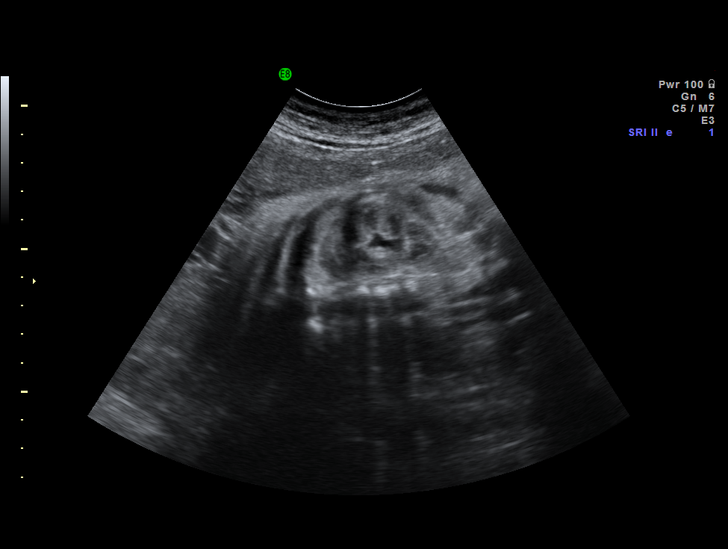

[14 of 25 positions shown; findings below may reference images not displayed]

IMPRESSION: The [facility] report has also been faxed to the ordering physician.

## 2006-10-06 ENCOUNTER — Inpatient Hospital Stay (HOSPITAL_COMMUNITY): Admission: AD | Admit: 2006-10-06 | Discharge: 2006-10-06 | Payer: Self-pay | Admitting: Obstetrics and Gynecology

## 2006-10-10 ENCOUNTER — Inpatient Hospital Stay (HOSPITAL_COMMUNITY): Admission: AD | Admit: 2006-10-10 | Discharge: 2006-10-10 | Payer: Self-pay | Admitting: Obstetrics and Gynecology

## 2006-10-10 ENCOUNTER — Inpatient Hospital Stay (HOSPITAL_COMMUNITY): Admission: AD | Admit: 2006-10-10 | Discharge: 2006-10-13 | Payer: Self-pay | Admitting: Obstetrics and Gynecology

## 2006-10-10 IMAGING — US US OB LIMITED
1 series · 14 of 23 positions shown · non-contrast
Comparison: none

OBSTETRICAL ULTRASOUND:

 This ultrasound exam was performed in the [HOSPITAL] Ultrasound Department.  The OB US report was generated in the [facility] system, and faxed to the ordering physician.  This report is also available in [REDACTED] PACS.

[Series 1: us fetal bpp w/o nonstress · non-contrast · 23 acquisitions, 14 frames shown]
[im 1/23]
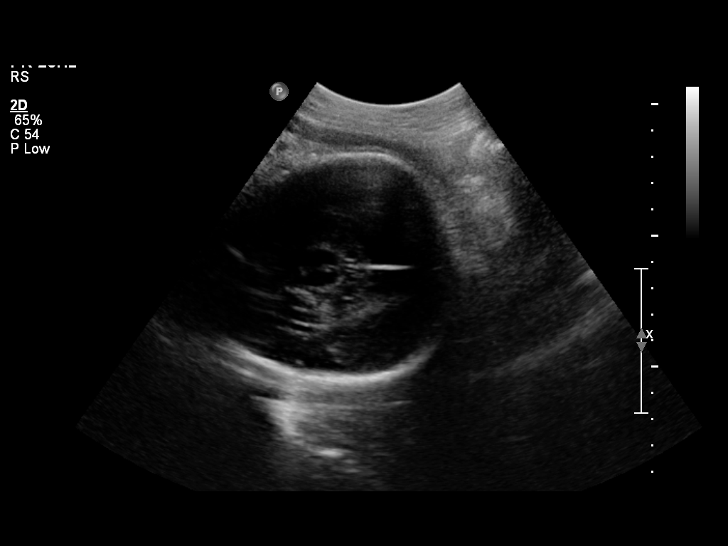
[im 3/23]
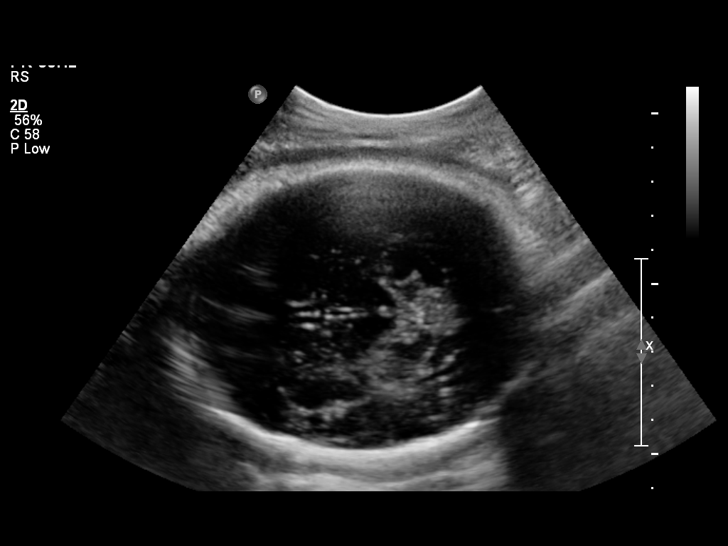
[im 5/23]
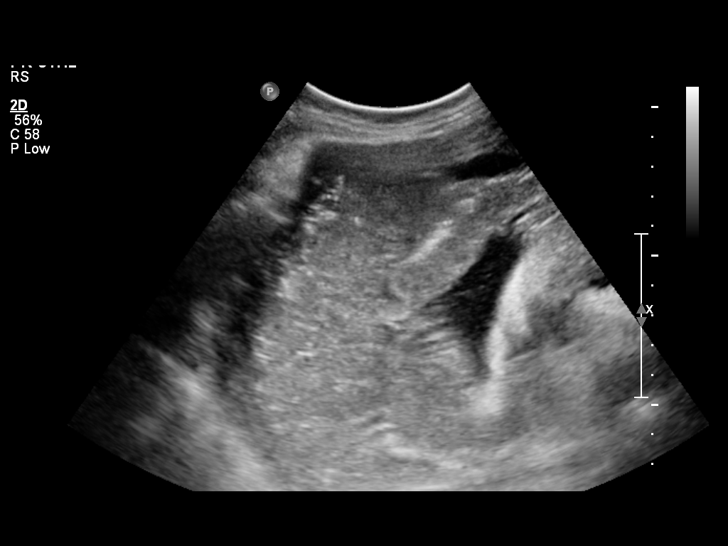
[im 6/23]
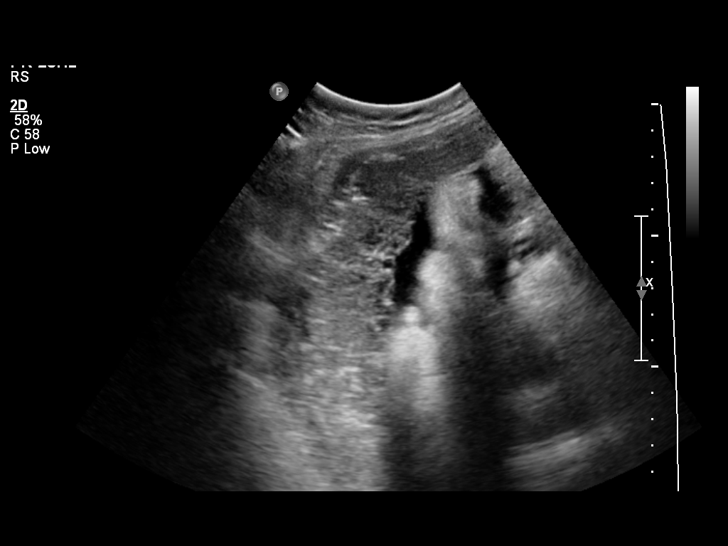
[im 8/23]
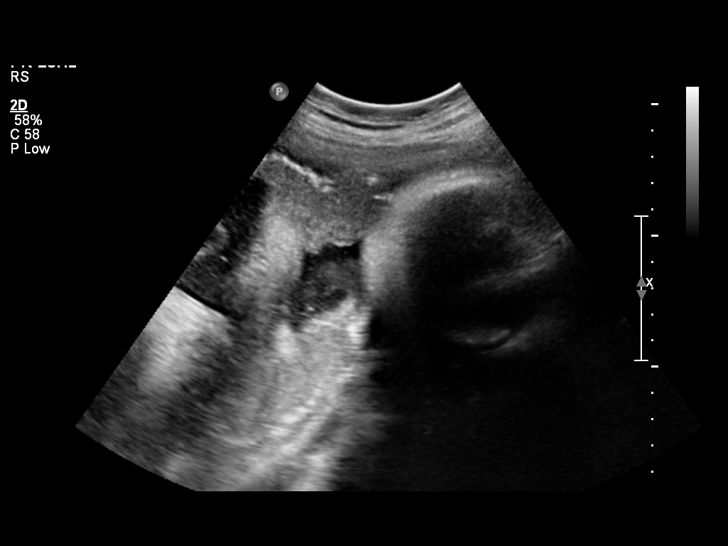
[im 10/23]
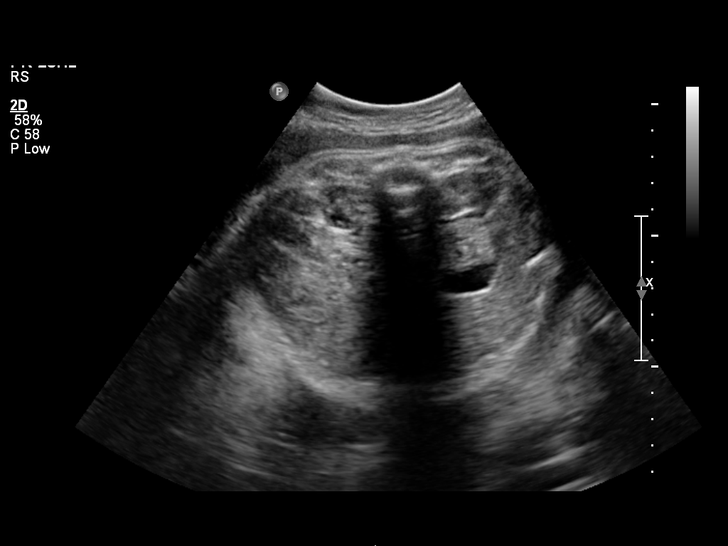
[im 11/23]
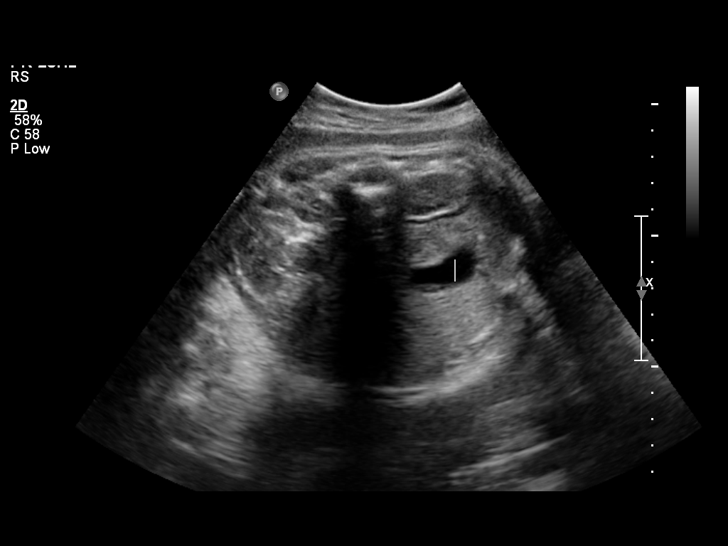
[im 13/23]
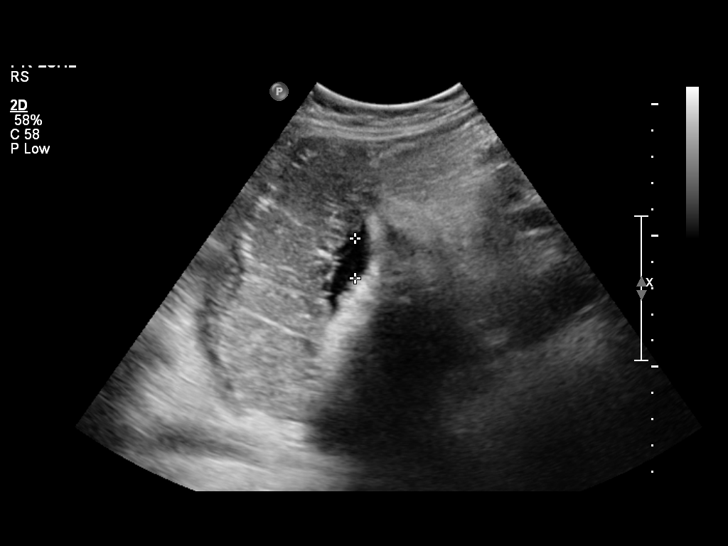
[im 14/23]
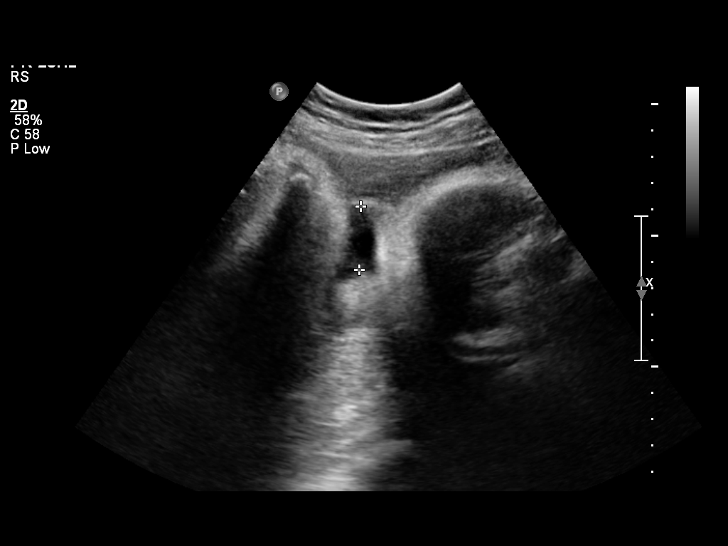
[im 16/23]
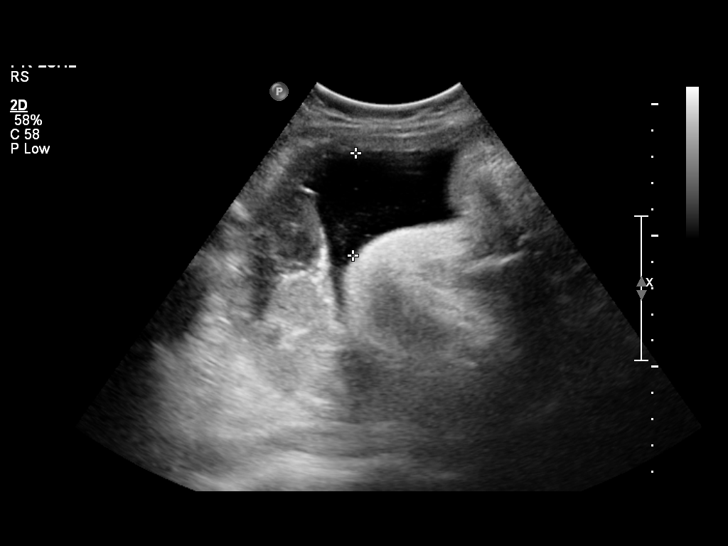
[im 18/23]
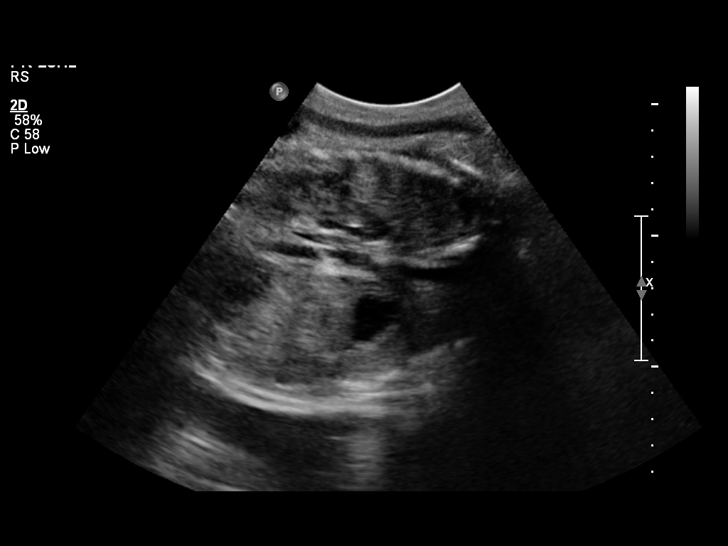
[im 19/23]
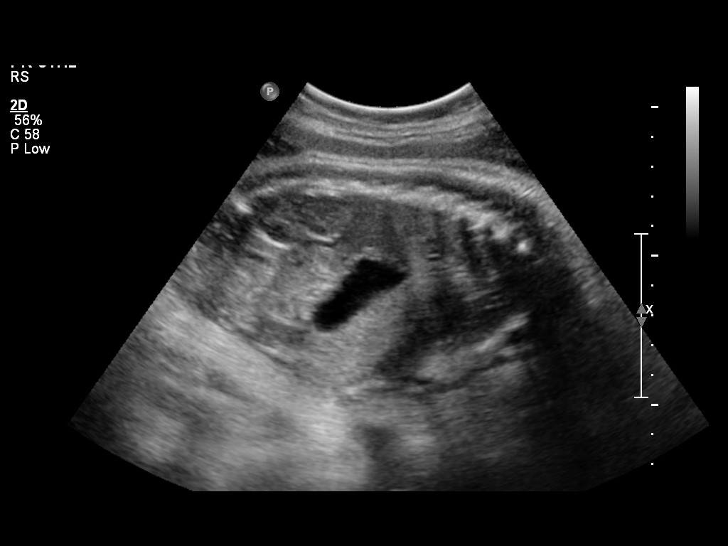
[im 21/23]
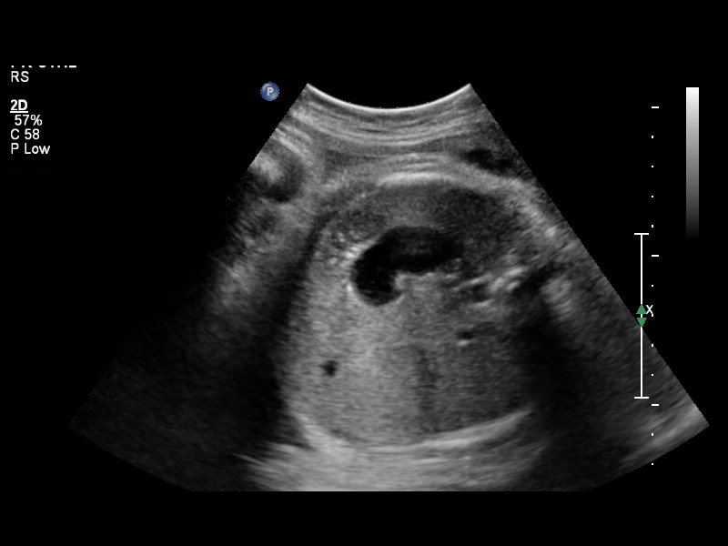
[im 23/23]
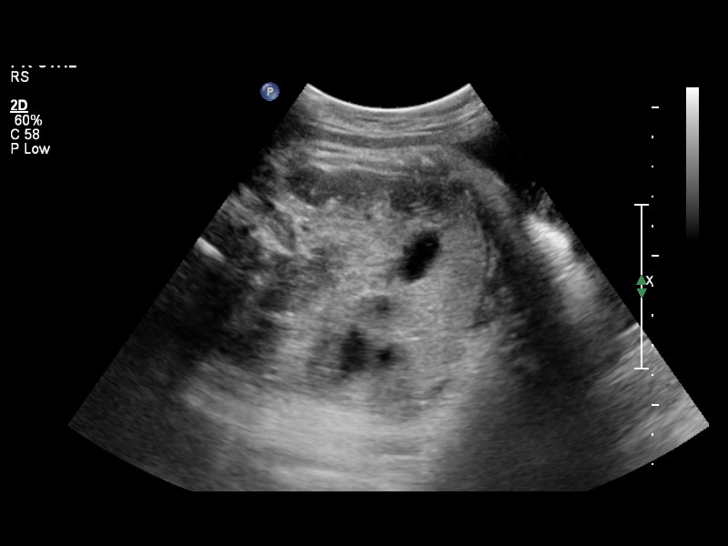

[14 of 23 positions shown; findings below may reference images not displayed]

IMPRESSION: See [facility] Obst[facility] US report.

## 2006-10-16 ENCOUNTER — Encounter: Admission: RE | Admit: 2006-10-16 | Discharge: 2006-10-27 | Payer: Self-pay | Admitting: Obstetrics and Gynecology

## 2006-10-27 ENCOUNTER — Inpatient Hospital Stay (HOSPITAL_COMMUNITY): Admission: AD | Admit: 2006-10-27 | Discharge: 2006-10-27 | Payer: Self-pay | Admitting: Obstetrics and Gynecology

## 2007-04-30 ENCOUNTER — Ambulatory Visit (HOSPITAL_COMMUNITY): Admission: RE | Admit: 2007-04-30 | Discharge: 2007-04-30 | Payer: Self-pay | Admitting: Obstetrics and Gynecology

## 2007-04-30 IMAGING — RF DG HYSTEROGRAM
5 series · 5 of 5 positions shown · IV contrast (omnipaque)
Comparison: None.

CLINICAL DATA: Essure placement, post placement exam. 
HYSTEROSALPINGOGRAM:
Following cleansing of the cervix and vagina with Betadine solution, a hysterosalpingogram was performed using a 5-French hysterosalpingogram catheter and Omnipaque 300 contrast.  The patient tolerated the examination without difficulty.

[Series 1: run · 1 of 1 slices shown (1 of 5)]
[im 1/1]
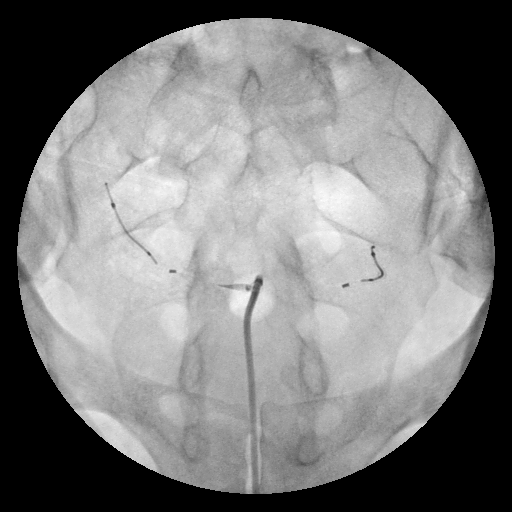

[Series 2: run · 1 of 1 slices shown (2 of 5)]
[im 1/1]
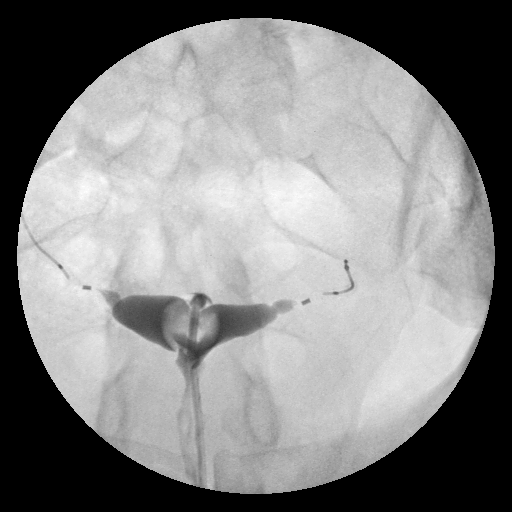

[Series 3: run · 1 of 1 slices shown (3 of 5)]
[im 1/1]
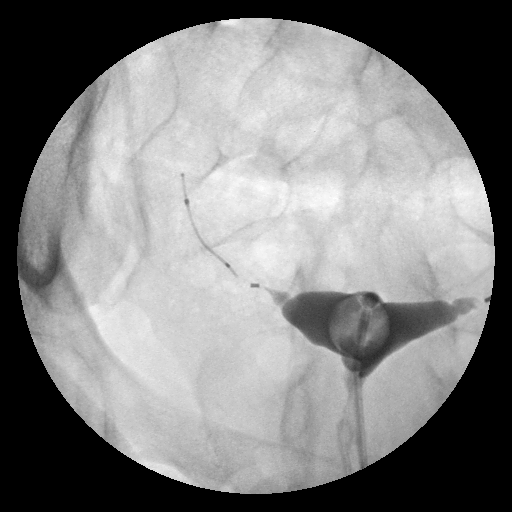

[Series 4: run · 1 of 1 slices shown (4 of 5)]
[im 1/1]
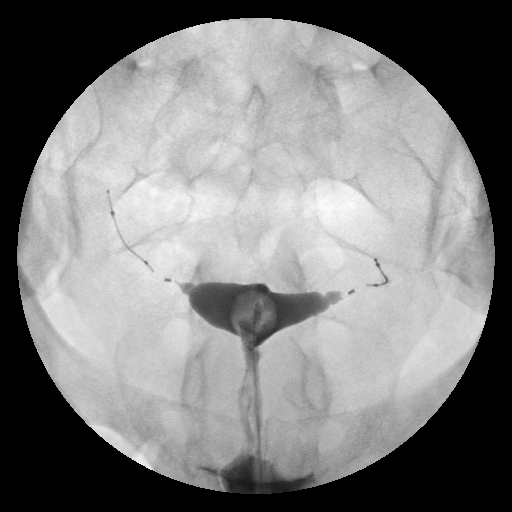

[Series 5: run · 1 of 1 slices shown (5 of 5)]
[im 1/1]
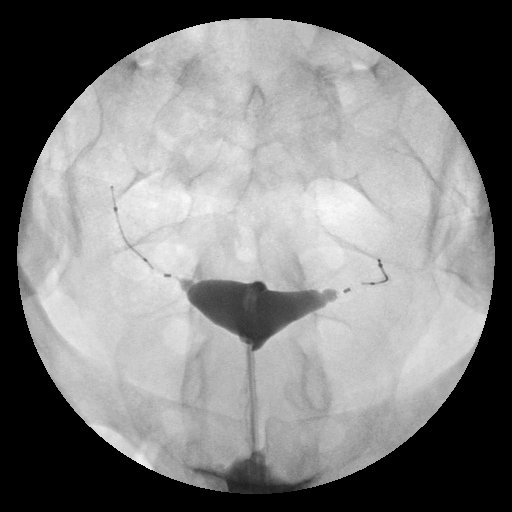

[5 of 5 positions shown; findings below may reference images not displayed]

FINDINGS: Cannulation of the cervix and hysterosalpingography was performed by Dr. AKBAR without the radiologist present.  Images are provided for interpretation. 
The endometrial canal is adequately distended.  There is opacification of the cornua, with the proximal end of the outer coils located just distal to the bilateral cornua.  No opacification of contrast is seen within the left fallopian tube beyond the level of the cornu.  There is minimal contrast opacification within the right fallopian tube to the level of the proximal and of the inner coil.  There is no extravasation of contrast within the peritoneal cavity on either side.
IMPRESSION: 1.  The proximal ends of both outer coils are just distal to the uterine tubal junction.  
2.  Minimal contrast opacification of the right fallopian tube to the level of the proximal end of the inner coil. 
  Occlusion at the left ureterotubal junction.

## 2007-10-30 ENCOUNTER — Ambulatory Visit (HOSPITAL_COMMUNITY): Admission: RE | Admit: 2007-10-30 | Discharge: 2007-10-30 | Payer: Self-pay | Admitting: Obstetrics and Gynecology

## 2009-05-06 ENCOUNTER — Emergency Department (HOSPITAL_BASED_OUTPATIENT_CLINIC_OR_DEPARTMENT_OTHER)
Admission: EM | Admit: 2009-05-06 | Discharge: 2009-05-06 | Payer: Self-pay | Source: Home / Self Care | Admitting: Emergency Medicine

## 2010-02-02 ENCOUNTER — Ambulatory Visit (HOSPITAL_COMMUNITY)
Admission: RE | Admit: 2010-02-02 | Discharge: 2010-02-02 | Payer: Self-pay | Source: Home / Self Care | Attending: Obstetrics and Gynecology | Admitting: Obstetrics and Gynecology

## 2010-04-19 LAB — CBC: WBC: 4.8 10*3/uL (ref 4.0–10.5)

## 2010-04-19 LAB — SURGICAL PCR SCREEN: Staphylococcus aureus: POSITIVE — AB

## 2010-05-03 LAB — URINALYSIS, ROUTINE W REFLEX MICROSCOPIC
Bilirubin Urine: NEGATIVE
Glucose, UA: NEGATIVE mg/dL
pH: 7 (ref 5.0–8.0)

## 2010-05-03 LAB — BASIC METABOLIC PANEL
BUN: 13 mg/dL (ref 6–23)
CO2: 25 mEq/L (ref 19–32)
Creatinine, Ser: 0.7 mg/dL (ref 0.4–1.2)
GFR calc Af Amer: >60 mL/min (ref >60)
GFR calc non Af Amer: >60 mL/min (ref >60)
Potassium: 4.3 mEq/L (ref 3.5–5.1)
Sodium: 142 mEq/L (ref 135–145)

## 2010-05-03 LAB — URINE MICROSCOPIC-ADD ON

## 2010-05-03 LAB — CBC
Hemoglobin: 11.8 g/dL — ABNORMAL LOW (ref 12.0–15.0)
MCHC: 33.2 g/dL (ref 30.0–36.0)
Platelets: 284 10*3/uL (ref 150–400)
RDW: 13.4 % (ref 11.5–15.5)
WBC: 5.3 10*3/uL (ref 4.0–10.5)

## 2010-05-03 LAB — DIFFERENTIAL
Lymphocytes Relative: 28 % (ref 12–46)
Monocytes Relative: 6 % (ref 3–12)

## 2010-06-22 NOTE — Op Note (Signed)
NAMEMARSHAE, Michele Cohen          ACCOUNT NO.:  [id number]   MEDICAL RECORD NO.:  [id number]          PATIENT TYPE:  AMB   LOCATION:  SDC                           FACILITY:  [facility]   PHYSICIAN:  Duke Salvia. Marcelle Overlie, M.D.DATE OF BIRTH:  27-Mar-1980   DATE OF PROCEDURE:  DATE OF DISCHARGE:                               OPERATIVE REPORT   PREOPERATIVE DIAGNOSIS:  Dyspareunia.   POSTOPERATIVE DIAGNOSES:  Dyspareunia, possible adenomyosis.   PROCEDURE:  Diagnostic laparoscopy.   SPECIMENS:  None.   COMPLICATIONS:  None.   ESTIMATED BLOOD LOSS:  Minimal.   PROCEDURE AND FINDINGS:  The patient was taken to the operating room  after an adequate level of general endotracheal anesthesia was obtained  with the patient's legs in stirrups.  The abdomen, perineum and vagina  were prepped and draped in usual manner for laparoscopy.  Bladder was  drained.  EUA was carried out.  Uterus was mid positioned upper limits  of normal size.  Adnexa negative, mobile.  Hulka tenaculum was  positioned.  Attention was directed to the abdomen.  The subumbilical  area was infiltrated with 0.25% percent Marcaine plain.  A small  incision was made.  The Veress needle was introduced without difficulty.  Due to higher pressure readings confirming intraperitoneal placement,  decision was made to proceed with open procedure.  The fascia was  entered after [employer] retractors were used to expose, peritoneal layer  could be identified, was entered sharply and an open trocar was inserted  and the pneumoperitoneum was then created.  There was no evidence of any  bleeding or trauma.  The patient was then placed in Trendelenburg.  Three fingerbreadths above the symphysis at midline, a 5-mm trocar was  inserted.  The uterus was anteflexed.  Pelvic findings as follows.   First of all, the liver edge, cecum, appendix all appeared to be normal.   Uterus itself was symmetrically large approximately 6-week size, mobile.  Bilateral adnexa unremarkable.  Cul-de-sac was free and clear.  A  possible small posterior fibroid was noted along with possible  adenomyosis.  The uterus appearing to be boggy and symmetrically  slightly enlarged.  After these findings were noted and photo documented  instruments were removed, gas was allowed to escape.  The defect closed  by closing the fascia with a 2-0 Vicryl on a UR needle, 4-0 Vicryl  Rapide subcuticular.  Dermabond used on the lower incision.  Hulka  tenaculum was removed.  She tolerated this well, went to recovery room  in good condition.     Richard M. Marcelle Overlie, M.D.  Electronically Signed    [facility]/MEDQ  D:  10/30/2007  T:  10/30/2007  Job:  [id number]

## 2010-06-22 NOTE — H&P (Signed)
Michele Cohen, COURSER          ACCOUNT NO.:  [id number]   MEDICAL RECORD NO.:  [id number]          PATIENT TYPE:  AMB   LOCATION:  SDC                           FACILITY:  [facility]   PHYSICIAN:  Duke Salvia. Marcelle Overlie, M.D.DATE OF BIRTH:  [birth year]   DATE OF ADMISSION:  10/30/2007  DATE OF DISCHARGE:                              HISTORY & PHYSICAL   CHIEF COMPLAINT:  Pelvic pain, dyspareunia.   HISTORY OF PRESENT ILLNESS:  A 30 year old G4, P3.  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  She presented in the summer 2009  complaining of deep dyspareunia and pelvic pain at other times with some  hematuria noted on initial visit.  Part of her evaluation, she had  ultrasound done September 2009 that showed a retroverted uterus of  October 26, 2007, multiple small follicle cyst, but no other  abnormalities except for small amount of loculated fluid in the cul-de-  sac.  Urine culture returned normal.  Due to the severity of the  dyspareunia to the point, she cannot tolerate sexually.  She presents  now for diagnostic laparoscopy.  This procedure including risks related  to bleeding, infection, adjacent organ injury, the possible need for  open additional surgery, laparoscopic treatment of adhesions, and  endometriosis reviewed with her also.   PAST MEDICAL HISTORY:   ALLERGIES:  None.   OPERATIONS:  Vaginal delivery x3, Essure.  She also had a left breast  tumor removed at age 82.   FAMILY HISTORY:  Significant for breast and ovarian cancer.  Her mother  had a hysterectomy due to fibroids and also family history of  hypertension and diabetes.   PHYSICAL EXAMINATION:  VITAL SIGNS:  Temperature 92, blood pressure  120/78.  HEENT:  Unremarkable.  NECK:  Supple without masses.  LUNGS:  Clear.  CARDIOVASCULAR:  Regular rate and rhythm without murmurs, rubs, or  gallops.  BREASTS:  Without masses.  ABDOMEN:  Soft and flat.  Nontender.  PELVIC:  Normal external genitalia.  Vagina and cervix clear.  Uterus,  mid posterior normal size, minimally tender.  No unusual nodularity.  Adnexa unremarkable.  EXTREMITIES:  Unremarkable.  NEUROLOGIC:  Unremarkable.   IMPRESSION:  Deep dyspareunia, rule out adhesions and endometriosis.   PLAN:  Diagnostic laparoscopy procedure and risks reviewed as above up.      Richard M. Marcelle Overlie, M.D.  Electronically Signed     [facility]/MEDQ  D:  10/26/2007  T:  10/26/2007  Job:  [id number]

## 2010-11-08 LAB — CBC
HCT: 35.8 — ABNORMAL LOW
Hemoglobin: 11.9 — ABNORMAL LOW
MCHC: 33.2
RBC: 4.15
RDW: 13.5
WBC: 4.8

## 2010-11-08 LAB — PREGNANCY, URINE: Preg Test, Ur: NEGATIVE

## 2010-11-18 LAB — URINE MICROSCOPIC-ADD ON

## 2010-11-18 LAB — CBC
Hemoglobin: 12.2
MCHC: 33
MCV: 77.9 — ABNORMAL LOW
WBC: 6.1

## 2010-11-18 LAB — URINALYSIS, ROUTINE W REFLEX MICROSCOPIC
Glucose, UA: NEGATIVE
Nitrite: NEGATIVE
Specific Gravity, Urine: 1.025
pH: 6

## 2010-11-18 LAB — COMPREHENSIVE METABOLIC PANEL
AST: 21
Albumin: 3.5
BUN: 9
CO2: 28
Chloride: 103
GFR calc Af Amer: >60
Glucose, Bld: 92
Potassium: 4.5

## 2010-11-18 LAB — LACTATE DEHYDROGENASE: LDH: 192

## 2010-11-18 LAB — URIC ACID: Uric Acid, Serum: 4.1

## 2010-11-19 LAB — COMPREHENSIVE METABOLIC PANEL
Albumin: 2.5 — ABNORMAL LOW
BUN: 4 — ABNORMAL LOW
Chloride: 107
Creatinine, Ser: 0.47
GFR calc non Af Amer: >60
Total Bilirubin: 0.4

## 2010-11-19 LAB — CBC
HCT: 23.3 — ABNORMAL LOW
HCT: 26.6 — ABNORMAL LOW
HCT: 28.3 — ABNORMAL LOW
Hemoglobin: 7.8 — CL
MCV: 77.2 — ABNORMAL LOW
Platelets: 225
Platelets: 230
WBC: 10.3
WBC: 10.7 — ABNORMAL HIGH
WBC: 11.9 — ABNORMAL HIGH

## 2010-11-19 LAB — URINALYSIS, ROUTINE W REFLEX MICROSCOPIC
Bilirubin Urine: NEGATIVE
Nitrite: NEGATIVE
Specific Gravity, Urine: 1.01
Urobilinogen, UA: 0.2

## 2010-11-19 LAB — DIFFERENTIAL
Lymphocytes Relative: 8 — ABNORMAL LOW
Lymphs Abs: 0.8
Neutro Abs: 9.1 — ABNORMAL HIGH
Neutrophils Relative %: 88 — ABNORMAL HIGH

## 2010-11-19 LAB — URINE MICROSCOPIC-ADD ON

## 2010-11-19 LAB — RPR: RPR Ser Ql: NONREACTIVE

## 2010-11-19 LAB — URIC ACID: Uric Acid, Serum: 3

## 2010-11-22 LAB — URINE MICROSCOPIC-ADD ON

## 2010-11-22 LAB — URINALYSIS, ROUTINE W REFLEX MICROSCOPIC
Bilirubin Urine: NEGATIVE
Hgb urine dipstick: NEGATIVE
Nitrite: NEGATIVE
Specific Gravity, Urine: 1.02
pH: 6.5

## 2011-05-03 ENCOUNTER — Emergency Department (HOSPITAL_BASED_OUTPATIENT_CLINIC_OR_DEPARTMENT_OTHER)
Admission: EM | Admit: 2011-05-03 | Discharge: 2011-05-03 | Disposition: A | Discharge status: Home / Self Care | Payer: Self-pay | Attending: Emergency Medicine | Admitting: Emergency Medicine

## 2011-05-03 ENCOUNTER — Encounter (HOSPITAL_BASED_OUTPATIENT_CLINIC_OR_DEPARTMENT_OTHER): Payer: Self-pay | Admitting: Emergency Medicine

## 2011-05-03 DIAGNOSIS — D649 Anemia, unspecified: Secondary | ICD-10-CM | POA: Insufficient documentation

## 2011-05-03 DIAGNOSIS — T07XXXA Unspecified multiple injuries, initial encounter: Secondary | ICD-10-CM | POA: Insufficient documentation

## 2011-05-03 DIAGNOSIS — X58XXXA Exposure to other specified factors, initial encounter: Secondary | ICD-10-CM | POA: Insufficient documentation

## 2011-05-03 HISTORY — DX: Anemia, unspecified: D64.9

## 2011-05-03 HISTORY — DX: Personal history of other benign neoplasm: Z86.018

## 2011-05-03 HISTORY — DX: Pain disorder exclusively related to psychological factors: F45.41

## 2011-05-03 LAB — COMPREHENSIVE METABOLIC PANEL
ALT: 25 U/L (ref 0–35)
AST: 16 U/L (ref 0–37)
Albumin: 4.2 g/dL (ref 3.5–5.2)
Alkaline Phosphatase: 59 U/L (ref 39–117)
BUN: 10 mg/dL (ref 6–23)
Chloride: 103 mEq/L (ref 96–112)
Potassium: 3.9 mEq/L (ref 3.5–5.1)
Sodium: 138 mEq/L (ref 135–145)
Total Bilirubin: 0.2 mg/dL — ABNORMAL LOW (ref 0.3–1.2)
Total Protein: 7.6 g/dL (ref 6.0–8.3)

## 2011-05-03 LAB — DIFFERENTIAL
Basophils Relative: 1 % (ref 0–1)
Monocytes Relative: 8 % (ref 3–12)
Neutro Abs: 3.7 10*3/uL (ref 1.7–7.7)
Neutrophils Relative %: 68 % (ref 43–77)

## 2011-05-03 LAB — CBC
Hemoglobin: 12.4 g/dL (ref 12.0–15.0)
MCHC: 33.1 g/dL (ref 30.0–36.0)
Platelets: 234 10*3/uL (ref 150–400)
RBC: 4.63 MIL/uL (ref 3.87–5.11)

## 2011-05-03 NOTE — ED Notes (Addendum)
Pt having bruising x 5 weeks.  Takes extended amount of time to heal.  Some bleeding with brushing teeth.  No hematuria or bloody stool.  Some heavier menstrual bleeding.

## 2011-05-03 NOTE — Discharge Instructions (Signed)
Contusion  A contusion is a deep bruise. Contusions happen when an injury causes bleeding under the skin. Signs of bruising include pain, puffiness (swelling), and discolored skin. The contusion may turn blue, purple, or yellow.  HOME CARE    Put ice on the injured area.   Put ice in a plastic bag.   Place a towel between your skin and the bag.   Leave the ice on for 15 to 20 minutes, 3 to 4 times a day.   Only take medicine as told by your doctor.   Rest the injured area.   If possible, raise (elevate) the injured area to lessen puffiness.  GET HELP RIGHT AWAY IF:    You have more bruising or puffiness.   You have pain that is getting worse.   Your puffiness or pain is not helped by medicine.  MAKE SURE YOU:    Understand these instructions.   Will watch your condition.   Will get help right away if you are not doing well or get worse.  Document Released: 07/13/2007 Document Revised: 01/13/2011 Document Reviewed: 11/29/2010  [employer] Patient Information 2012 [employer].

## 2011-05-03 NOTE — ED Provider Notes (Signed)
History     CSN: [id number]  Arrival date & time 05/03/11  1648   First MD Initiated Contact with Patient 05/03/11 1719      Chief Complaint  Patient presents with  . Bleeding/Bruising    (Consider location/radiation/quality/duration/timing/severity/associated sxs/prior treatment) Patient is a 31 y.o. female presenting with musculoskeletal pain. The history is provided by the patient. No language interpreter was used.  Muscle Pain This is a recurrent problem. The current episode started more than 1 month ago. The problem occurs constantly. The problem has been resolved. Associated symptoms include myalgias. The symptoms are aggravated by nothing. She has tried nothing for the symptoms. The treatment provided no relief.  Pt complains of finding bruises all over her.  Pt reports bruises appear but she has not had history of injuries.    Past Medical History  Diagnosis Date  . Stress headaches   . History of benign breast tumor   . Anemia     History reviewed. No pertinent past surgical history.  History reviewed. No pertinent family history.  History  Substance Use Topics  . Smoking status: Not on file  . Smokeless tobacco: Not on file  . Alcohol Use: Yes     occasional    OB History    Grav Para Term Preterm Abortions TAB SAB Ect Mult Living                  Review of Systems  Musculoskeletal: Positive for myalgias.  Skin: Positive for color change.  All other systems reviewed and are negative.    Allergies  Ambien  Home Medications  No current outpatient prescriptions on file.  BP 121/75  Pulse 95  Temp(Src) 98.4 F (36.9 C) (Oral)  Resp 16  SpO2 100%  LMP 04/10/2011  Physical Exam  Nursing note and vitals reviewed. Constitutional: She is oriented to person, place, and time. She appears well-developed and well-nourished.  HENT:  Head: Normocephalic.  Neck: Normal range of motion.  Cardiovascular: Normal rate and normal heart sounds.     Pulmonary/Chest: Effort normal.  Abdominal: Soft.  Musculoskeletal: She exhibits tenderness.       Scattered bruises arms and legs,   Neurological: She is alert and oriented to person, place, and time. She has normal reflexes.  Skin: Skin is dry.  Psychiatric: She has a normal mood and affect.    ED Course  Procedures (including critical care time)  Labs Reviewed  COMPREHENSIVE METABOLIC PANEL - Abnormal; Notable for the following:    Total Bilirubin 0.2 (*)    All other components within normal limits  CBC  DIFFERENTIAL   No results found.   No diagnosis found.    MDM   Results for orders placed during the hospital encounter of 05/03/11  CBC      Component Value Range   WBC 5.4  4.0 - 10.5 (K/uL)   RBC 4.63  3.87 - 5.11 (MIL/uL)   Hemoglobin 12.4  12.0 - 15.0 (g/dL)   HCT 40.9  81.1 - 91.4 (%)   MCV 81.0  78.0 - 100.0 (fL)   MCH 26.8  26.0 - 34.0 (pg)   MCHC 33.1  30.0 - 36.0 (g/dL)   RDW 78.2  95.6 - 21.3 (%)   Platelets 234  150 - 400 (K/uL)  DIFFERENTIAL      Component Value Range   Neutrophils Relative 68  43 - 77 (%)   Neutro Abs 3.7  1.7 - 7.7 (K/uL)   Lymphocytes Relative  23  12 - 46 (%)   Lymphs Abs 1.2  0.7 - 4.0 (K/uL)   Monocytes Relative 8  3 - 12 (%)   Monocytes Absolute 0.4  0.1 - 1.0 (K/uL)   Eosinophils Relative 1  0 - 5 (%)   Eosinophils Absolute 0.1  0.0 - 0.7 (K/uL)   Basophils Relative 1  0 - 1 (%)   Basophils Absolute 0.0  0.0 - 0.1 (K/uL)  COMPREHENSIVE METABOLIC PANEL      Component Value Range   Sodium 138  135 - 145 (mEq/L)   Potassium 3.9  3.5 - 5.1 (mEq/L)   Chloride 103  96 - 112 (mEq/L)   CO2 27  19 - 32 (mEq/L)   Glucose, Bld 85  70 - 99 (mg/dL)   BUN 10  6 - 23 (mg/dL)   Creatinine, Ser 8.41  0.50 - 1.10 (mg/dL)   Calcium 9.9  8.4 - 32.4 (mg/dL)   Total Protein 7.6  6.0 - 8.3 (g/dL)   Albumin 4.2  3.5 - 5.2 (g/dL)   AST 16  0 - 37 (U/L)   ALT 25  0 - 35 (U/L)   Alkaline Phosphatase 59  39 - 117 (U/L)   Total  Bilirubin 0.2 (*) 0.3 - 1.2 (mg/dL)   GFR calc non Af Amer >90  >90 (mL/min)   GFR calc Af Amer >90  >90 (mL/min)   No results found.   Pt advised no evidence of blood problem.    Lonia Skinner [place], [place] 05/03/11 1914

## 2011-05-04 NOTE — ED Provider Notes (Signed)
Medical screening examination/treatment/procedure(s) were performed by non-physician practitioner and as supervising physician I was immediately available for consultation/collaboration.   Samanthajo Payano, MD 05/04/11 0845 

## 2011-06-23 ENCOUNTER — Emergency Department (HOSPITAL_BASED_OUTPATIENT_CLINIC_OR_DEPARTMENT_OTHER): Payer: Self-pay

## 2011-06-23 ENCOUNTER — Encounter (HOSPITAL_BASED_OUTPATIENT_CLINIC_OR_DEPARTMENT_OTHER): Payer: Self-pay | Admitting: Emergency Medicine

## 2011-06-23 ENCOUNTER — Emergency Department (HOSPITAL_BASED_OUTPATIENT_CLINIC_OR_DEPARTMENT_OTHER)
Admission: EM | Admit: 2011-06-23 | Discharge: 2011-06-23 | Disposition: A | Discharge status: Home / Self Care | Payer: Self-pay | Attending: Emergency Medicine | Admitting: Emergency Medicine

## 2011-06-23 DIAGNOSIS — R000 Tachycardia, unspecified: Secondary | ICD-10-CM | POA: Insufficient documentation

## 2011-06-23 DIAGNOSIS — R5381 Other malaise: Secondary | ICD-10-CM | POA: Insufficient documentation

## 2011-06-23 DIAGNOSIS — E860 Dehydration: Secondary | ICD-10-CM | POA: Insufficient documentation

## 2011-06-23 DIAGNOSIS — R509 Fever, unspecified: Secondary | ICD-10-CM | POA: Insufficient documentation

## 2011-06-23 DIAGNOSIS — R51 Headache: Secondary | ICD-10-CM | POA: Insufficient documentation

## 2011-06-23 DIAGNOSIS — R5383 Other fatigue: Secondary | ICD-10-CM | POA: Insufficient documentation

## 2011-06-23 LAB — URINALYSIS, ROUTINE W REFLEX MICROSCOPIC
Glucose, UA: NEGATIVE mg/dL
Hgb urine dipstick: NEGATIVE
Ketones, ur: 15 mg/dL — AB
Leukocytes, UA: NEGATIVE
Protein, ur: NEGATIVE mg/dL
Urobilinogen, UA: 0.2 mg/dL (ref 0.0–1.0)

## 2011-06-23 LAB — CSF CELL COUNT WITH DIFFERENTIAL
RBC Count, CSF: 4 /mm3 — ABNORMAL HIGH
WBC, CSF: 1 /mm3 (ref 0–5)

## 2011-06-23 LAB — DIFFERENTIAL
Basophils Absolute: 0 10*3/uL (ref 0.0–0.1)
Eosinophils Absolute: 0 10*3/uL (ref 0.0–0.7)
Eosinophils Relative: 0 % (ref 0–5)
Monocytes Absolute: 0.7 10*3/uL (ref 0.1–1.0)

## 2011-06-23 LAB — BASIC METABOLIC PANEL
CO2: 25 mEq/L (ref 19–32)
Calcium: 9.2 mg/dL (ref 8.4–10.5)
Creatinine, Ser: 0.8 mg/dL (ref 0.50–1.10)
GFR calc non Af Amer: >90 mL/min (ref >90)
Sodium: 135 mEq/L (ref 135–145)

## 2011-06-23 LAB — CBC
HCT: 34.9 % — ABNORMAL LOW (ref 36.0–46.0)
MCH: 26.9 pg (ref 26.0–34.0)
MCHC: 33.2 g/dL (ref 30.0–36.0)
MCV: 81 fL (ref 78.0–100.0)
Platelets: 235 10*3/uL (ref 150–400)
RDW: 14 % (ref 11.5–15.5)
WBC: 9.9 10*3/uL (ref 4.0–10.5)

## 2011-06-23 IMAGING — CR DG CHEST 2V
2 series · 2 of 2 positions shown · non-contrast
Comparison: None.

CLINICAL DATA: Headache, fever, body aches.

CHEST - 2 VIEW

[w chest pa]
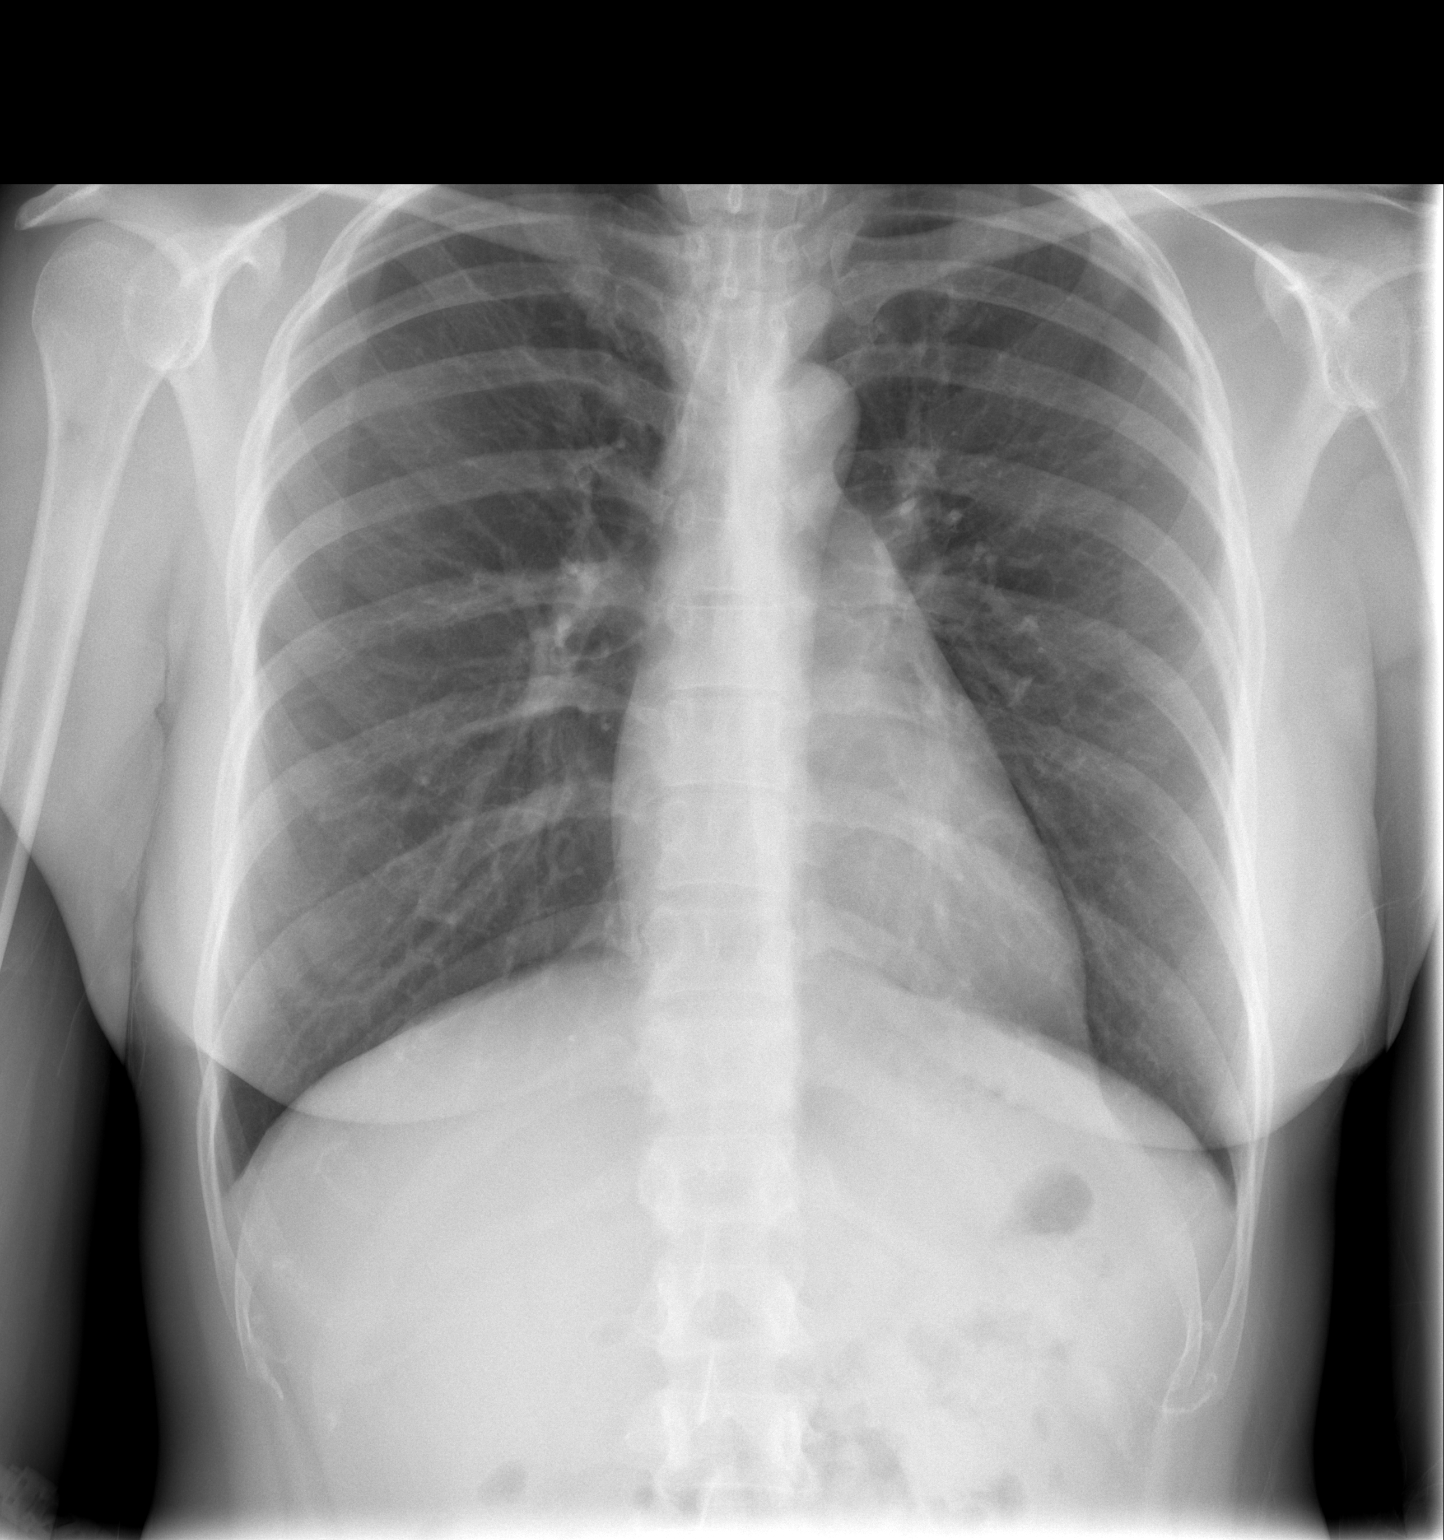

[w chest lat]
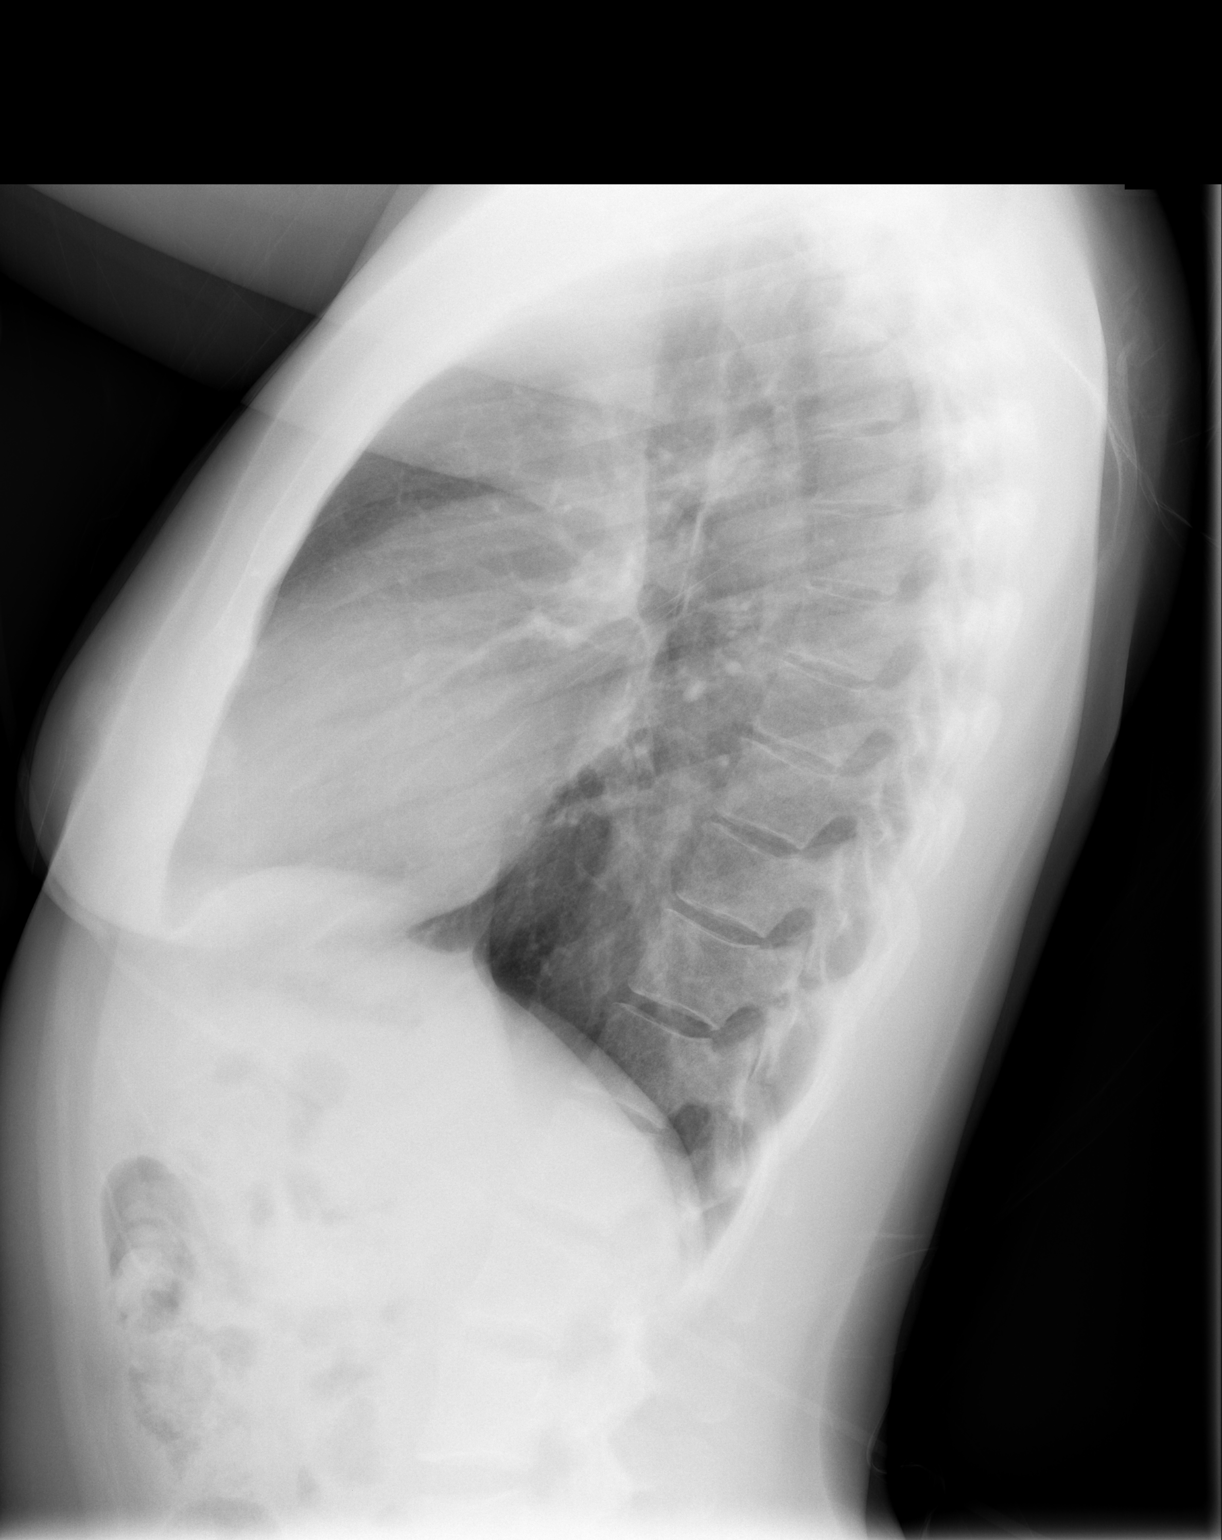

[2 of 2 positions shown; findings below may reference images not displayed]

FINDINGS: Upper normal heart size.  Clear lungs.  No pneumothorax
or pleural effusion.  Minimal irregular pleural apical thickening
has a chronic appearance
IMPRESSION: No active cardiopulmonary disease.

## 2011-06-23 MED ORDER — SODIUM CHLORIDE 0.9 % IV BOLUS (SEPSIS)
1000.0000 mL | Freq: Once | INTRAVENOUS | Status: AC
  Administered 2011-06-23: 1000 mL via INTRAVENOUS

## 2011-06-23 MED ORDER — KETOROLAC TROMETHAMINE 30 MG/ML IJ SOLN
30.0000 mg | Freq: Once | INTRAMUSCULAR | Status: AC
  Administered 2011-06-23: 30 mg via INTRAVENOUS
  Filled 2011-06-23: qty 1

## 2011-06-23 MED ORDER — DEXAMETHASONE SODIUM PHOSPHATE 10 MG/ML IJ SOLN
INTRAMUSCULAR | Status: AC
  Administered 2011-06-23: 10 mg
  Filled 2011-06-23: qty 1

## 2011-06-23 MED ORDER — ACETAMINOPHEN 325 MG PO TABS
650.0000 mg | ORAL_TABLET | Freq: Once | ORAL | Status: AC
  Administered 2011-06-23: 650 mg via ORAL
  Filled 2011-06-23: qty 2

## 2011-06-23 MED ORDER — MORPHINE SULFATE 4 MG/ML IJ SOLN
INTRAMUSCULAR | Status: AC
  Administered 2011-06-23: 4 mg
  Filled 2011-06-23: qty 1

## 2011-06-23 MED ORDER — MORPHINE SULFATE 4 MG/ML IJ SOLN
4.0000 mg | Freq: Once | INTRAMUSCULAR | Status: AC
  Administered 2011-06-23: 4 mg via INTRAVENOUS
  Filled 2011-06-23: qty 1

## 2011-06-23 NOTE — Discharge Instructions (Signed)
Please return for worsening headache, persistent fevers or other concerns.  Fever of Unknown Origin Fever of "unknown origin" is a fever of at least 101 F (38.3 C) or greater, and that has gone on daily for three weeks. It is a fever which has a hidden cause. Fever is a higher-than-normal body temperature. Normal temperature is usually defined as 98.6 F or 37 C. Fever is a symptom, not a disease. A fever may mean that there is something else going on in the body that is causing it. CAUSES Fever can be caused by many conditions, including:   Infections.   Tissue injuries.   Medicines.   Different diseases.   Being in hot surroundings.   Tumors or cancers (this is a rare cause).  SYMPTOMS The signs and symptoms of a fever depend on the cause. At first, a fever can cause a chill. When the brain raises the body's "thermostat," the body responds by shivering to raise the temperature. Shivering produces heat in the body. Once the temperature goes up, the person often feels warm. When the fever goes away, the person may start to sweat. DIAGNOSIS  There can be many causes of fever. Sometimes, the reason can be very difficult to find. Your caregiver may have to do numerous tests to track down the reason. TREATMENT   Medication may be used to control fever.   Do not use aspirin because of the association with Reye's syndrome.   If an infection is suspected to be causing the fever and medications have been prescribed, take them as directed. Finish the full course of medications until they are gone.   Sponging or bathing in lukewarm water can cool the skin and reduce body temperature. Ice water or alcohol sponge baths are not as effective as lukewarm water and should not be used.  HOME CARE  Continue to eat normally.   Drink enough fluids to keep urine clear or pale yellow.   Broths, decaffeinated tea, decaffeinated soft drinks, and oral rehydration solutions (ORS) can help replace  fluids and electrolytes.   Keep all follow-up appointments as directed by your caregiver.   Weigh yourself once a day. Write down the weights and bring them to your follow-up appointments to review with your caregiver.  SEEK IMMEDIATE MEDICAL CARE IF:   You or your child is unable to keep fluids down.   Vomiting or diarrhea develop or are present and become persistent (continued).   There is excessive weakness, dizziness, fainting or extreme thirst.   You have a fever or persistent symptoms for more than 72 hours.   You have a fever and your symptoms suddenly get worse.  Document Released: 12/11/2003 Document Revised: 01/13/2011 Document Reviewed: 01/24/2005 [facility] Patient Information 2012 [place], [place].  Headache, General, Unknown Cause The specific cause of your headache may not have been found today. There are many causes and types of headache. A few common ones are:  Tension headache.   Migraine.   Infections (examples: dental and sinus infections).   Bone and/or joint problems in the neck or jaw.   Depression.   Eye problems.  These headaches are not life threatening.  Headaches can sometimes be diagnosed by a patient history and a physical exam. Sometimes, lab and imaging studies (such as x-ray and/or CT scan) are used to rule out more serious problems. In some cases, a spinal tap (lumbar puncture) may be requested. There are many times when your exam and tests may be normal on the first visit  even when there is a serious problem causing your headaches. Because of that, it is very important to follow up with your doctor or local clinic for further evaluation. FINDING OUT THE RESULTS OF TESTS  If a radiology test was performed, a radiologist will review your results.   You will be contacted by the emergency department or your physician if any test results require a change in your treatment plan.   Not all test results may be available during your visit. If your test  results are not back during the visit, make an appointment with your caregiver to find out the results. Do not assume everything is normal if you have not heard from your caregiver or the medical facility. It is important for you to follow up on all of your test results.  HOME CARE INSTRUCTIONS   Keep follow-up appointments with your caregiver, or any specialist referral.   Only take over-the-counter or prescription medicines for pain, discomfort, or fever as directed by your caregiver.   Biofeedback, massage, or other relaxation techniques may be helpful.   Ice packs or heat applied to the head and neck can be used. Do this three to four times per day, or as needed.   Call your doctor if you have any questions or concerns.   If you smoke, you should quit.  SEEK MEDICAL CARE IF:   You develop problems with medications prescribed.   You do not respond to or obtain relief from medications.   You have a change from the usual headache.   You develop nausea or vomiting.  SEEK IMMEDIATE MEDICAL CARE IF:   If your headache becomes severe.   You have an unexplained oral temperature above 102 F (38.9 C), or as your caregiver suggests.   You have a stiff neck.   You have loss of vision.   You have muscular weakness.   You have loss of muscular control.   You develop severe symptoms different from your first symptoms.   You start losing your balance or have trouble walking.   You feel faint or pass out.  MAKE SURE YOU:   Understand these instructions.   Will watch your condition.   Will get help right away if you are not doing well or get worse.  Document Released: 01/24/2005 Document Revised: 01/13/2011 Document Reviewed: 09/13/2007 [facility] Patient Information 2012 [place], [place].

## 2011-06-23 NOTE — ED Provider Notes (Addendum)
History     CSN: [id number]  Arrival date & time 06/23/11  1451   First MD Initiated Contact with Patient 06/23/11 1521      Chief Complaint  Patient presents with  . Headache  . Generalized Body Aches    (Consider location/radiation/quality/duration/timing/severity/associated sxs/prior treatment) HPI Comments: Patient presents with symptoms that began on Sunday night.  She states that it began with mild headache and the headache has gradually worsened and become constant.  She's noted fevers as high as 103 at home.  She notes diffuse bodyaches as well as bilateral flank pain.  She has had some decreased urination as well since she notes she only urinated once in the last 24 hours.  She denies nausea or vomiting but has some decrease in her appetite.  No abdominal pain.  No diarrhea.  No specific dysuria.  Her last menstrual period was one week ago and normal for her.  No specific sick contacts or unusual travel.  No specific sore throat, cough or sinusitis symptoms.  Patient is a 31 y.o. female presenting with headaches. The history is provided by the patient. No language interpreter was used.  Headache  This is a new problem. The current episode started more than 2 days ago. The problem occurs constantly. The problem has not changed since onset.The headache is associated with nothing. The pain is located in the frontal and occipital region. The quality of the pain is described as throbbing. The pain is moderate. The pain does not radiate. Associated symptoms include a fever and malaise/fatigue. Pertinent negatives include no shortness of breath, no nausea and no vomiting. She has tried acetaminophen and NSAIDs for the symptoms. The treatment provided no relief.    Past Medical History  Diagnosis Date  . Stress headaches   . History of benign breast tumor   . Anemia     History reviewed. No pertinent past surgical history.  History reviewed. No pertinent family history.  History    Substance Use Topics  . Smoking status: Never Smoker   . Smokeless tobacco: Not on file  . Alcohol Use: Yes     occasional    OB History    Grav Para Term Preterm Abortions TAB SAB Ect Mult Living                  Review of Systems  Constitutional: Positive for fever, malaise/fatigue and appetite change. Negative for chills.  HENT: Negative for sore throat.   Eyes: Negative.  Negative for discharge and redness.  Respiratory: Negative.  Negative for cough and shortness of breath.   Cardiovascular: Negative.  Negative for chest pain.  Gastrointestinal: Negative.  Negative for nausea, vomiting, abdominal pain and diarrhea.  Genitourinary: Positive for decreased urine volume. Negative for dysuria and vaginal discharge.  Musculoskeletal: Negative.  Negative for back pain.  Skin: Negative.  Negative for color change and rash.  Neurological: Positive for headaches. Negative for syncope.  Hematological: Negative.  Negative for adenopathy.  Psychiatric/Behavioral: Negative.  Negative for confusion.  All other systems reviewed and are negative.    Allergies  Zolpidem tartrate  Home Medications   Current Outpatient Rx  Name Route Sig Dispense Refill  . ACETAMINOPHEN 325 MG PO TABS Oral Take by mouth every 6 (six) hours as needed. Patient used this medication for fever and pain.    . IBUPROFEN 200 MG PO TABS Oral Take 600 mg by mouth every 6 (six) hours as needed. Patient used this medication for fever and  pain.      BP 122/76  Pulse 120  Temp(Src) 101.6 F (38.7 C) (Oral)  Resp 16  Ht 5' 2.5" (1.588 m)  Wt 138 lb (62.596 kg)  BMI 24.84 kg/m2  SpO2 99%  LMP 06/18/2011  Physical Exam  Nursing note and vitals reviewed. Constitutional: She is oriented to person, place, and time. She appears well-developed and well-nourished.  Non-toxic appearance. She does not have a sickly appearance.  HENT:  Head: Normocephalic and atraumatic.  Mouth/Throat: Oropharynx is clear and  moist. No oropharyngeal exudate.       No tenderness to palpation over her sinuses.  Eyes: Conjunctivae, EOM and lids are normal. Pupils are equal, round, and reactive to light. No scleral icterus.  Neck: Trachea normal and normal range of motion. Neck supple.       No nuchal rigidity  Cardiovascular: Regular rhythm, S1 normal, S2 normal and normal heart sounds.  Tachycardia present.  Exam reveals no gallop and no friction rub.   No murmur heard. Pulmonary/Chest: Effort normal and breath sounds normal. No respiratory distress. She has no wheezes. She has no rales.  Abdominal: Soft. Normal appearance. There is no tenderness. There is no rebound, no guarding and no CVA tenderness.  Musculoskeletal: Normal range of motion.  Lymphadenopathy:    She has no cervical adenopathy.  Neurological: She is alert and oriented to person, place, and time. She has normal strength.  Skin: Skin is warm, dry and intact. No rash noted.  Psychiatric: She has a normal mood and affect. Her behavior is normal. Judgment and thought content normal.    ED Course  LUMBAR PUNCTURE Date/Time: 06/23/2011 5:18 PM Performed by: Emeline General A Authorized by: Emeline General A Consent: Verbal consent obtained. Written consent obtained. Risks and benefits: risks, benefits and alternatives were discussed Consent given by: patient Patient understanding: patient states understanding of the procedure being performed Patient consent: the patient's understanding of the procedure matches consent given Procedure consent: procedure consent matches procedure scheduled Patient identity confirmed: verbally with patient Indications: evaluation for infection Anesthesia: local infiltration Local anesthetic: lidocaine 1% without epinephrine Anesthetic total: 2 ml Patient sedated: no Preparation: Patient was prepped and draped in the usual sterile fashion. Lumbar space: L3-L4 interspace Patient's position: sitting Needle gauge:  20 Needle type: spinal needle - Quincke tip Needle length: 3.5 in Number of attempts: 2 Fluid appearance: blood-tinged then clearing Tubes of fluid: 4 Post-procedure: adhesive bandage applied Patient tolerance: Patient tolerated the procedure well with no immediate complications. Comments: The first tube is bloody related to traversing a blood vessel which contaminated the fluid.  Subsequent tubes were clear.   (including critical care time)  Results for orders placed during the hospital encounter of 06/23/11  CBC      Component Value Range   WBC 9.9  4.0 - 10.5 (K/uL)   RBC 4.31  3.87 - 5.11 (MIL/uL)   Hemoglobin 11.6 (*) 12.0 - 15.0 (g/dL)   HCT 16.1 (*) 09.6 - 46.0 (%)   MCV 81.0  78.0 - 100.0 (fL)   MCH 26.9  26.0 - 34.0 (pg)   MCHC 33.2  30.0 - 36.0 (g/dL)   RDW 04.5  40.9 - 81.1 (%)   Platelets 235  150 - 400 (K/uL)  DIFFERENTIAL      Component Value Range   Neutrophils Relative 85 (*) 43 - 77 (%)   Neutro Abs 8.4 (*) 1.7 - 7.7 (K/uL)   Lymphocytes Relative 8 (*) 12 - 46 (%)  Lymphs Abs 0.8  0.7 - 4.0 (K/uL)   Monocytes Relative 7  3 - 12 (%)   Monocytes Absolute 0.7  0.1 - 1.0 (K/uL)   Eosinophils Relative 0  0 - 5 (%)   Eosinophils Absolute 0.0  0.0 - 0.7 (K/uL)   Basophils Relative 0  0 - 1 (%)   Basophils Absolute 0.0  0.0 - 0.1 (K/uL)  BASIC METABOLIC PANEL      Component Value Range   Sodium 135  135 - 145 (mEq/L)   Potassium 3.8  3.5 - 5.1 (mEq/L)   Chloride 101  96 - 112 (mEq/L)   CO2 25  19 - 32 (mEq/L)   Glucose, Bld 93  70 - 99 (mg/dL)   BUN 7  6 - 23 (mg/dL)   Creatinine, Ser 1.61  0.50 - 1.10 (mg/dL)   Calcium 9.2  8.4 - 09.6 (mg/dL)   GFR calc non Af Amer >90  >90 (mL/min)   GFR calc Af Amer >90  >90 (mL/min)  URINALYSIS, ROUTINE W REFLEX MICROSCOPIC      Component Value Range   Color, Urine YELLOW  YELLOW    APPearance CLEAR  CLEAR    Specific Gravity, Urine 1.024  1.005 - 1.030    pH 8.0  5.0 - 8.0    Glucose, UA NEGATIVE  NEGATIVE (mg/dL)    Hgb urine dipstick NEGATIVE  NEGATIVE    Bilirubin Urine NEGATIVE  NEGATIVE    Ketones, ur 15 (*) NEGATIVE (mg/dL)   Protein, ur NEGATIVE  NEGATIVE (mg/dL)   Urobilinogen, UA 0.2  0.0 - 1.0 (mg/dL)   Nitrite NEGATIVE  NEGATIVE    Leukocytes, UA NEGATIVE  NEGATIVE   PREGNANCY, URINE      Component Value Range   Preg Test, Ur NEGATIVE  NEGATIVE   CSF CELL COUNT WITH DIFFERENTIAL      Component Value Range   Tube # 4     Color, CSF COLORLESS  COLORLESS    Appearance, CSF CLEAR  CLEAR    Supernatant NOT INDICATED     RBC Count, CSF 4 (*) 0 (/cu mm)   WBC, CSF 1  0 - 5 (/cu mm)   Segmented Neutrophils-CSF NONE SEEN  0 - 6 (%)   Lymphs, CSF FEW  40 - 80 (%)   Monocyte-Macrophage-Spinal Fluid RARE  15 - 45 (%)   Eosinophils, CSF NONE SEEN  0 - 1 (%)  GLUCOSE, CSF      Component Value Range   Glucose, CSF 58  43 - 76 (mg/dL)  PROTEIN, CSF      Component Value Range   Total  Protein, CSF 12 (*) 15 - 45 (mg/dL)   Dg Chest 2 View  0/45/4098  *RADIOLOGY REPORT*  Clinical Data: Headache, fever, body aches.  CHEST - 2 VIEW  Comparison: None.  Findings: Upper normal heart size.  Clear lungs.  No pneumothorax or pleural effusion.  Minimal irregular pleural apical thickening has a chronic appearance  IMPRESSION: No active cardiopulmonary disease.  Original Report Authenticated By: Donavan Burnet, M.D.      MDM  Patient presents with headache and body aches for the last 3-4 days.  Given patient's associated back pain and decreased urination I am going to consider UTI in my differential and obtain urinalysis.  We'll patient has not had specific respiratory symptoms I am going to obtain a chest x-ray and evaluation for source of fever.  We'll treat the patient's fever with Tylenol and  Toradol here.  Her tachycardia is likely due to a combination of her fever and her dehydration.  She received 1 L of normal saline for rehydration.  If the chest x-ray and urinalysis are negative given the patient's  fever and headache she will likely need a lumbar puncture to assess for the possibility of meningitis.  I have already mentioned this to the patient at this time.        Nat Christen, MD 06/23/11 1540  Nat Christen, MD 06/23/11 [phone]  Patient with a negative urinalysis and chest x-ray.  I did discuss obtaining a lumbar puncture with the patient is awake to assess for meningitis.  Patient understood the risks of post-LP headache in that it was highly unlikely that she could become paralyzed or have any significant risk of infection related to the procedure.  Patient had the opportunity to ask questions and had none.  She consented for the procedure and there were no noted complications during the procedure.  Her CSF is not consistent with infection at this time.  Notably I did initially traversed a blood vessel and had a bloody tap which explains blood that would be present in the first tube and even a small amount of blood in the fourth tube.  I do not have a cause for the patient's fever and headache at this time but as she does not appear to have CSF consistent with even viral meningitis I feel that she is safe for discharge home.  Patient notes that after the Decadron and the second dose of morphine she also has improvement in her headache and her heart rate is decreased with the IV fluids.  Nat Christen, MD 06/23/11 [phone]

## 2011-06-23 NOTE — ED Notes (Signed)
Chart reviewed and care assumed. 

## 2011-06-23 NOTE — ED Notes (Signed)
Patient transported to X-ray 

## 2011-06-23 NOTE — ED Notes (Signed)
Pt c/o HA and general body aches x 3 days

## 2011-06-27 LAB — CSF CULTURE W GRAM STAIN
Culture: NO GROWTH
Special Requests: NORMAL

## 2011-06-28 NOTE — ED Notes (Signed)
Patient here requesting note to return to work.

## 2011-12-22 ENCOUNTER — Emergency Department (HOSPITAL_BASED_OUTPATIENT_CLINIC_OR_DEPARTMENT_OTHER): Payer: Self-pay

## 2011-12-22 ENCOUNTER — Emergency Department (HOSPITAL_BASED_OUTPATIENT_CLINIC_OR_DEPARTMENT_OTHER)
Admission: EM | Admit: 2011-12-22 | Discharge: 2011-12-22 | Disposition: A | Discharge status: Home / Self Care | Payer: Self-pay | Attending: Emergency Medicine | Admitting: Emergency Medicine

## 2011-12-22 ENCOUNTER — Encounter (HOSPITAL_BASED_OUTPATIENT_CLINIC_OR_DEPARTMENT_OTHER): Payer: Self-pay

## 2011-12-22 DIAGNOSIS — K0889 Other specified disorders of teeth and supporting structures: Secondary | ICD-10-CM

## 2011-12-22 DIAGNOSIS — Z862 Personal history of diseases of the blood and blood-forming organs and certain disorders involving the immune mechanism: Secondary | ICD-10-CM | POA: Insufficient documentation

## 2011-12-22 DIAGNOSIS — K089 Disorder of teeth and supporting structures, unspecified: Secondary | ICD-10-CM | POA: Insufficient documentation

## 2011-12-22 DIAGNOSIS — R350 Frequency of micturition: Secondary | ICD-10-CM | POA: Insufficient documentation

## 2011-12-22 DIAGNOSIS — N6489 Other specified disorders of breast: Secondary | ICD-10-CM | POA: Insufficient documentation

## 2011-12-22 DIAGNOSIS — R300 Dysuria: Secondary | ICD-10-CM | POA: Insufficient documentation

## 2011-12-22 LAB — URINALYSIS, ROUTINE W REFLEX MICROSCOPIC
Glucose, UA: NEGATIVE mg/dL
Hgb urine dipstick: NEGATIVE
Leukocytes, UA: NEGATIVE
Specific Gravity, Urine: 1.017 (ref 1.005–1.030)
pH: 7 (ref 5.0–8.0)

## 2011-12-22 LAB — WET PREP, GENITAL: Yeast Wet Prep HPF POC: NONE SEEN

## 2011-12-22 LAB — RAPID STREP SCREEN (MED CTR MEBANE ONLY): Streptococcus, Group A Screen (Direct): NEGATIVE

## 2011-12-22 IMAGING — US US PELVIS COMPLETE
1 series · 14 of 25 positions shown · non-contrast
Comparison: None.

CLINICAL DATA: Urinary frequency.



[Series 1: us pelvis complete · 0.24mm/px · 14 of 79 slices shown]
[im 1/79]
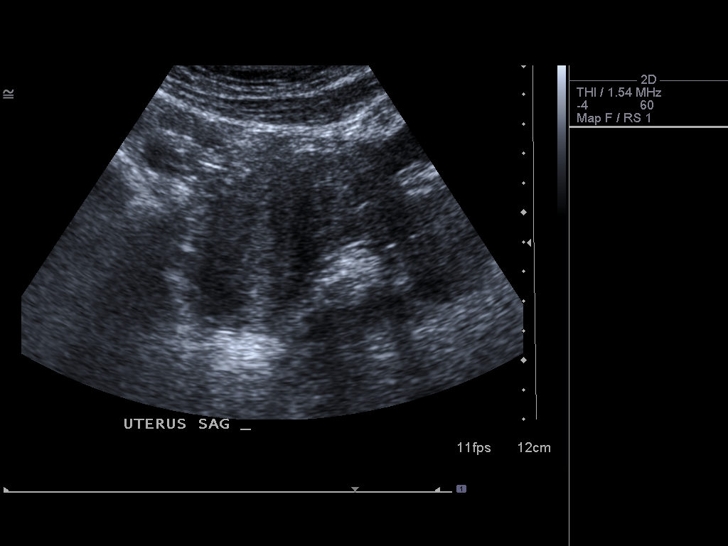
[im 7/79]
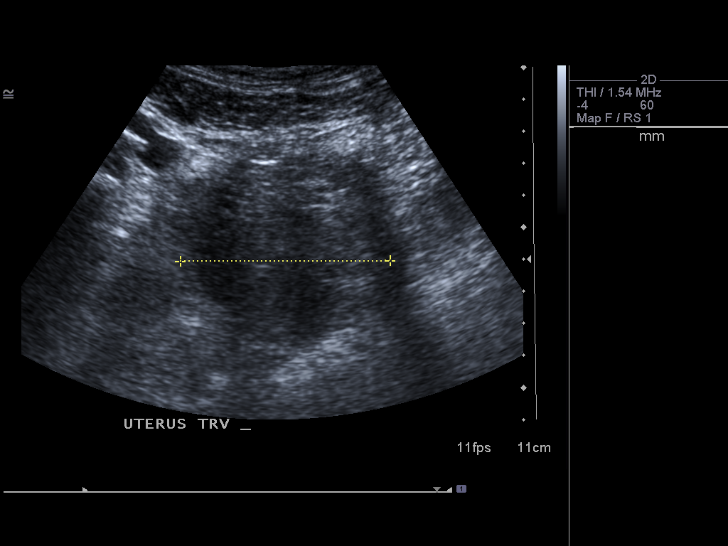
[im 14/79]
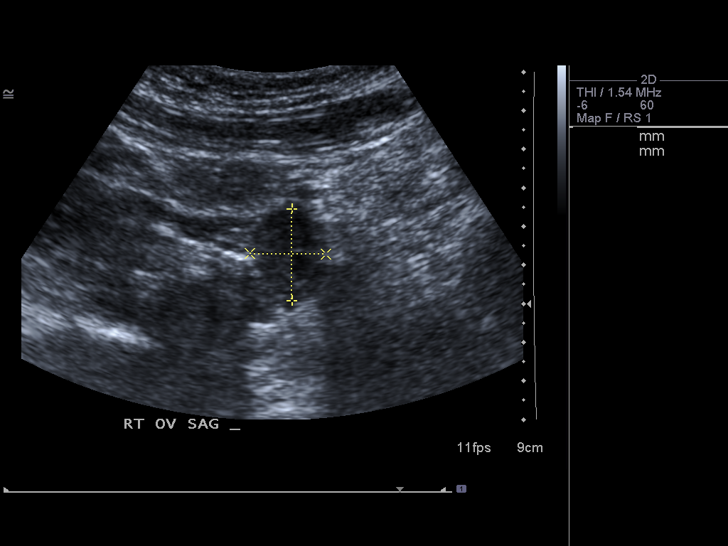
[im 20/79]
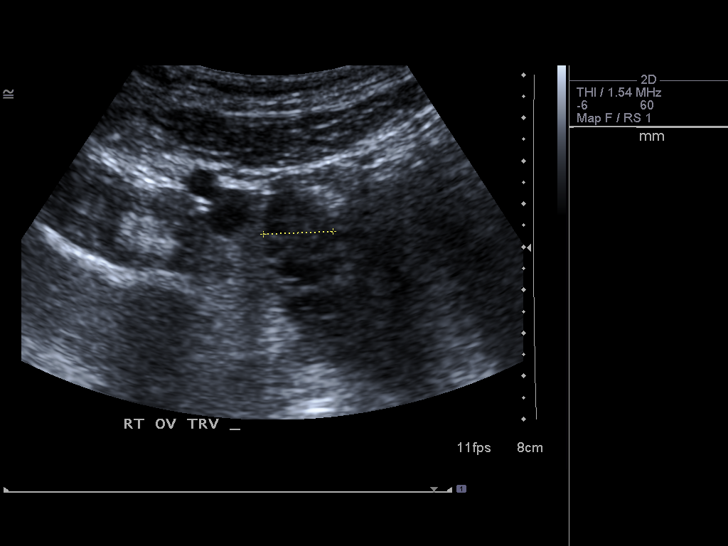
[im 27/79]
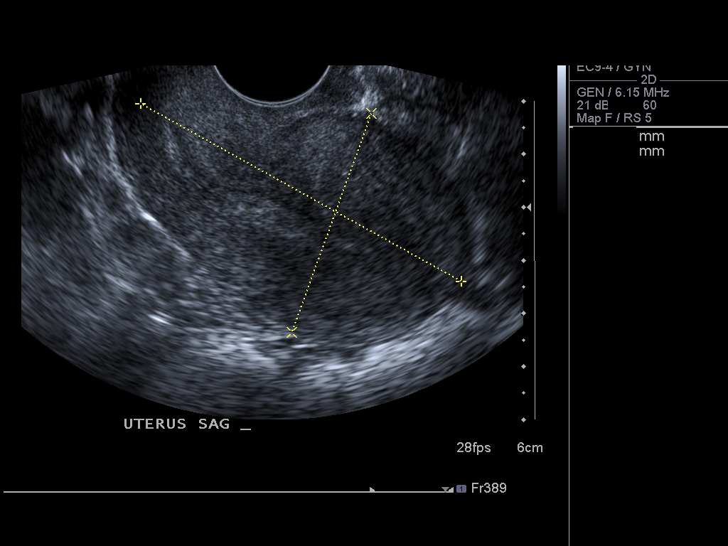
[im 30/79]
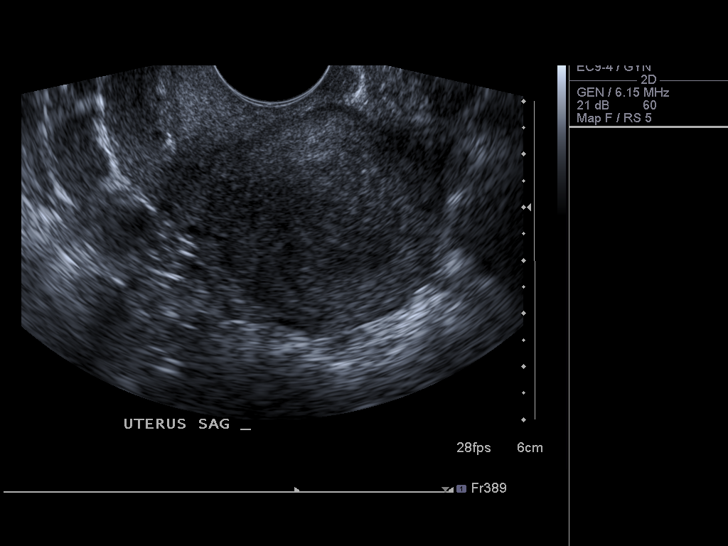
[im 36/79]
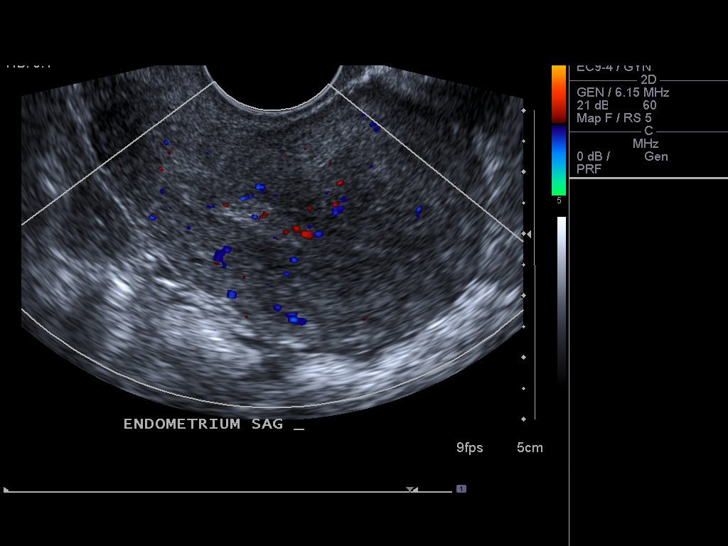
[im 43/79]
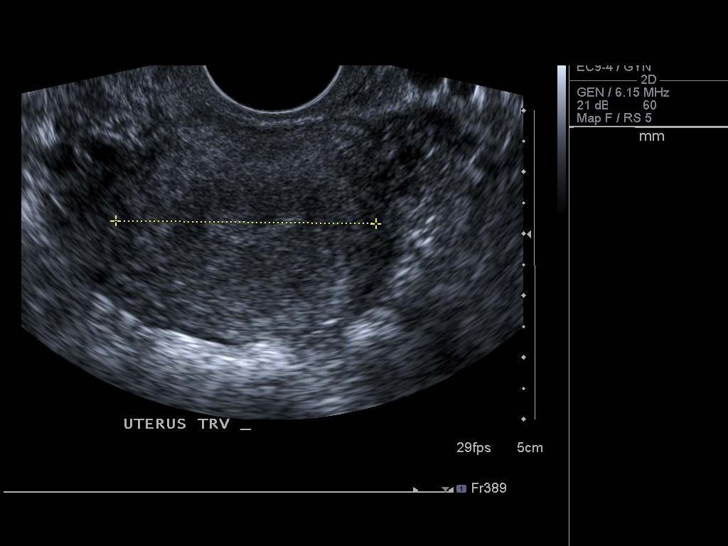
[im 49/79]
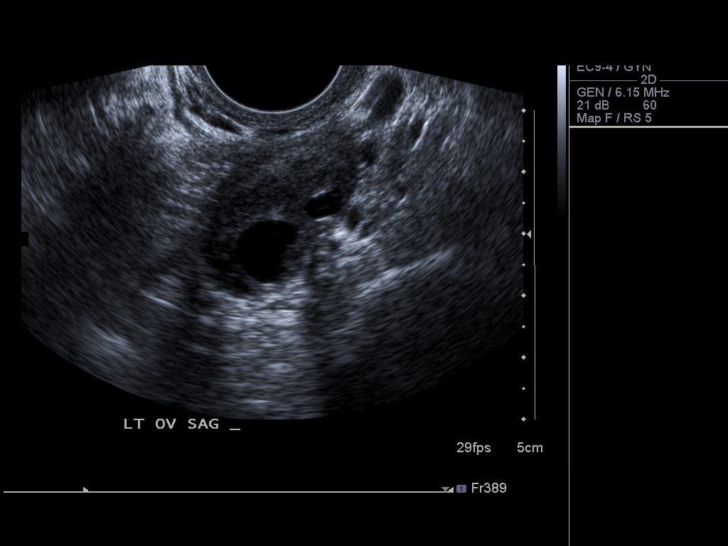
[im 53/79]
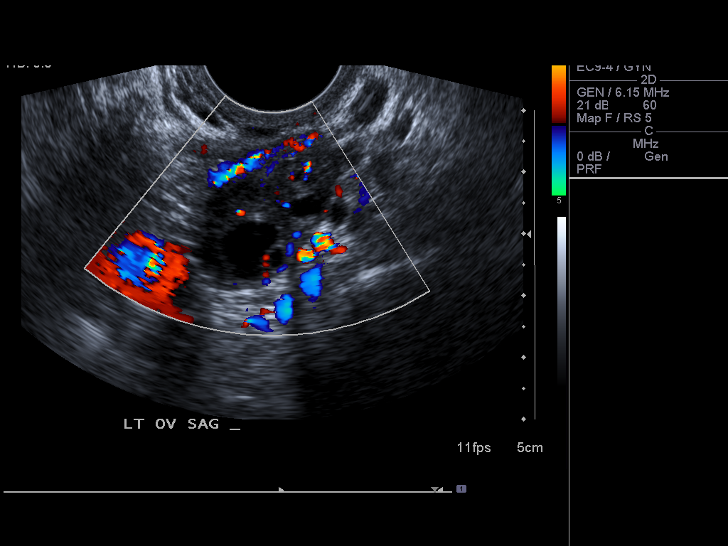
[im 59/79]
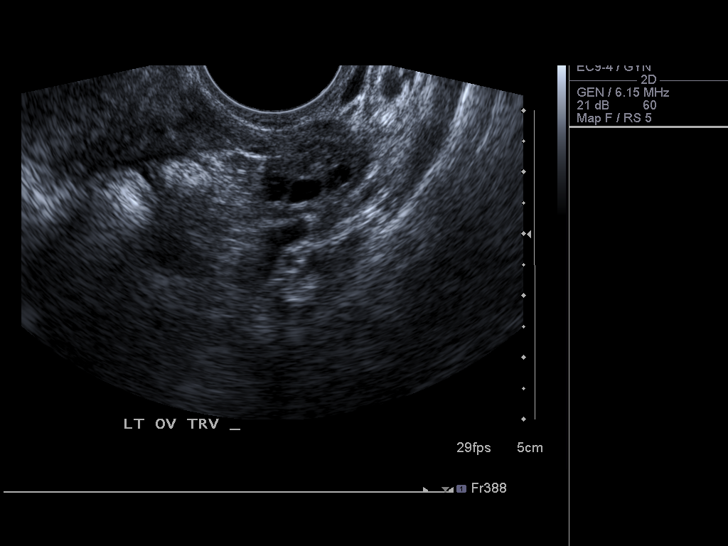
[im 66/79]
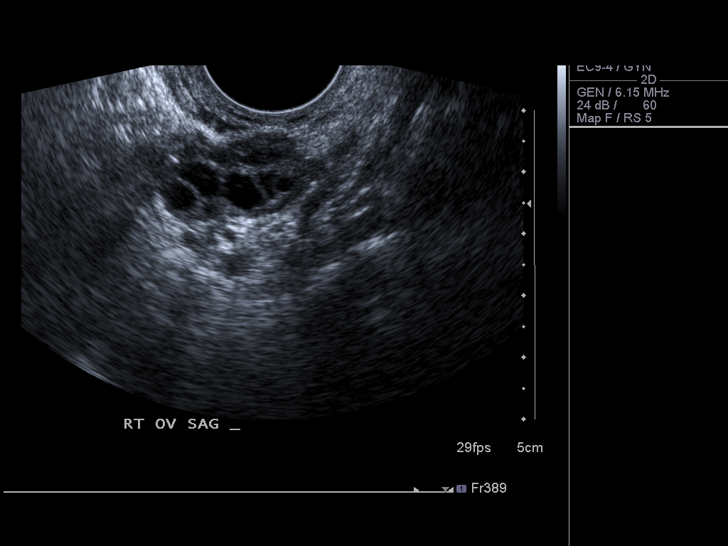
[im 72/79]
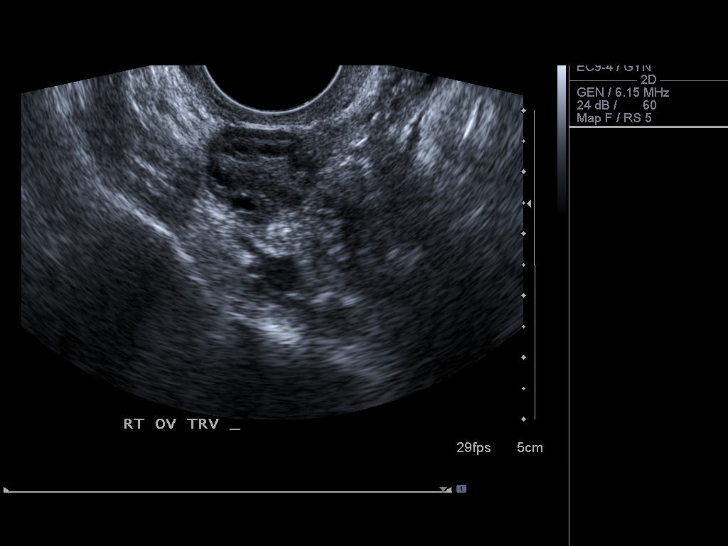
[im 79/79]
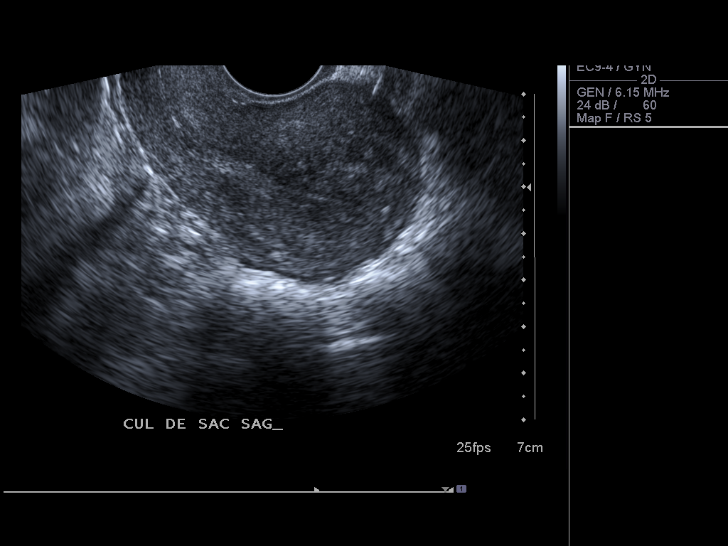

[14 of 25 positions shown; findings below may reference images not displayed]

FINDINGS: Uterus:  The uterus measures 6.9 x 4.4 x 4.2 cm.  The myometrium
has a heterogeneous echotexture.

Endometrium: Appears normal measuring 7.1 mm.

Right ovary: Normal appearance/no adnexal mass. Appears normal
measuring 2.8 x 1.4 x 1.8 cm.

Left ovary: Normal appearance/no adnexal mass.  Appears normal
measuring 2.9 x 1.8 x 1.9 cm.

Other Findings:  No free fluid
IMPRESSION: 1.  No acute findings.
2.  Slightly heterogeneous echotexture of the uterus.

## 2011-12-22 MED ORDER — HYDROCODONE-ACETAMINOPHEN 5-325 MG PO TABS
2.0000 | ORAL_TABLET | Freq: Once | ORAL | Status: DC
  Filled 2011-12-22: qty 2

## 2011-12-22 MED ORDER — TRAMADOL HCL 50 MG PO TABS
50.0000 mg | ORAL_TABLET | Freq: Once | ORAL | Status: AC
  Administered 2011-12-22: 50 mg via ORAL
  Filled 2011-12-22: qty 1

## 2011-12-22 MED ORDER — TRAMADOL HCL 50 MG PO TABS: 50.0000 mg | ORAL_TABLET | Freq: Four times a day (QID) | ORAL | Status: DC | PRN

## 2011-12-22 MED ORDER — CEPHALEXIN 500 MG PO CAPS: 500.0000 mg | ORAL_CAPSULE | Freq: Four times a day (QID) | ORAL | Status: DC

## 2011-12-22 NOTE — ED Provider Notes (Signed)
History     CSN: [id number]  Arrival date & time 12/22/11  1818   First MD Initiated Contact with Patient 12/22/11 1931      Chief Complaint  Patient presents with  . Sore Throat  . Urinary Frequency    (Consider location/radiation/quality/duration/timing/severity/associated sxs/prior treatment) Patient is a 31 y.o. female presenting with pharyngitis and dysuria. The history is provided by the patient. No language interpreter was used.  Sore Throat This is a new problem. Episode onset: 2 days. The problem occurs constantly. The problem has been gradually worsening. The treatment provided mild relief.  Dysuria  This is a new problem. The current episode started more than 2 days ago. The problem occurs every urination. Associated symptoms include frequency and urgency.  Pt complains of pain in mouth around back right lower tooth and throat.    Pt complains of frequent urination and pressure on her bladder area  Past Medical History  Diagnosis Date  . Stress headaches   . History of benign breast tumor   . Anemia     History reviewed. No pertinent past surgical history.  No family history on file.  History  Substance Use Topics  . Smoking status: Never Smoker   . Smokeless tobacco: Not on file  . Alcohol Use: No     Comment: occasional    OB History    Grav Para Term Preterm Abortions TAB SAB Ect Mult Living                  Review of Systems  HENT: Positive for dental problem.   Genitourinary: Positive for dysuria, urgency and frequency. Negative for vaginal bleeding and vaginal discharge.  All other systems reviewed and are negative.    Allergies  Zolpidem tartrate  Home Medications   Current Outpatient Rx  Name  Route  Sig  Dispense  Refill  . ACETAMINOPHEN 325 MG PO TABS   Oral   Take by mouth every 6 (six) hours as needed. Patient used this medication for fever and pain.         . IBUPROFEN 200 MG PO TABS   Oral   Take 600 mg by mouth every 6  (six) hours as needed. Patient used this medication for fever and pain.           BP 118/82  Pulse 82  Temp 98.3 F (36.8 C) (Oral)  Resp 16  Ht 5\' 2"  (1.575 m)  Wt 130 lb (58.968 kg)  BMI 23.78 kg/m2  SpO2 100%  LMP 12/15/2011  Physical Exam  Nursing note and vitals reviewed. Constitutional: She appears well-developed and well-nourished.  HENT:  Head: Normocephalic and atraumatic.       Swelling around right lower 2nd molar  Eyes: Conjunctivae normal are normal. Pupils are equal, round, and reactive to light.  Neck: Normal range of motion.  Cardiovascular: Normal rate and normal heart sounds.   Pulmonary/Chest: Effort normal and breath sounds normal.  Abdominal: Soft.  Genitourinary: Vagina normal. No vaginal discharge found.  Musculoskeletal: Normal range of motion.  Neurological: She is alert.  Skin: Skin is warm.    ED Course  Procedures (including critical care time)   Labs Reviewed  PREGNANCY, URINE  URINALYSIS, ROUTINE W REFLEX MICROSCOPIC  RAPID STREP SCREEN   No results found.   No diagnosis found.  Ultrasound show no ovarain cyst,  No abnormality  MDM  Pt given rx for keflex and tramadol.   Referral to dentist.  Lonia Skinner [place], [place] 12/22/11 2121

## 2011-12-22 NOTE — ED Notes (Signed)
Pt reports onset of sore throat 2 days ago and urinary frequency x 2 weeks.

## 2011-12-22 NOTE — ED Notes (Signed)
Pt requested pain med-EDPA notified

## 2011-12-22 NOTE — ED Notes (Signed)
In to give vicodin-pt states makes her nauseated and requests tramadol -EDPA notified

## 2011-12-22 NOTE — ED Provider Notes (Signed)
Medical screening examination/treatment/procedure(s) were performed by non-physician practitioner and as supervising physician I was immediately available for consultation/collaboration.   Loren Racer, MD 12/22/11 [phone]

## 2012-07-11 LAB — GRAM STAIN: Special Requests: NORMAL

## 2015-07-20 DIAGNOSIS — N63 Unspecified lump in unspecified breast: Secondary | ICD-10-CM | POA: Insufficient documentation

## 2015-07-20 DIAGNOSIS — R922 Inconclusive mammogram: Secondary | ICD-10-CM | POA: Insufficient documentation

## 2015-07-20 DIAGNOSIS — Z803 Family history of malignant neoplasm of breast: Secondary | ICD-10-CM | POA: Insufficient documentation

## 2017-02-07 DIAGNOSIS — I2699 Other pulmonary embolism without acute cor pulmonale: Secondary | ICD-10-CM

## 2017-02-07 HISTORY — DX: Other pulmonary embolism without acute cor pulmonale: I26.99

## 2017-03-13 DIAGNOSIS — I2699 Other pulmonary embolism without acute cor pulmonale: Secondary | ICD-10-CM | POA: Insufficient documentation

## 2017-03-13 DIAGNOSIS — N760 Acute vaginitis: Secondary | ICD-10-CM | POA: Insufficient documentation

## 2017-03-13 DIAGNOSIS — I809 Phlebitis and thrombophlebitis of unspecified site: Secondary | ICD-10-CM | POA: Insufficient documentation

## 2017-03-13 DIAGNOSIS — R519 Headache, unspecified: Secondary | ICD-10-CM | POA: Insufficient documentation

## 2017-03-13 DIAGNOSIS — R0602 Shortness of breath: Secondary | ICD-10-CM | POA: Insufficient documentation

## 2017-03-13 DIAGNOSIS — K625 Hemorrhage of anus and rectum: Secondary | ICD-10-CM | POA: Insufficient documentation

## 2017-03-13 DIAGNOSIS — Z90710 Acquired absence of both cervix and uterus: Secondary | ICD-10-CM | POA: Insufficient documentation

## 2017-03-13 DIAGNOSIS — R300 Dysuria: Secondary | ICD-10-CM | POA: Insufficient documentation

## 2017-03-14 DIAGNOSIS — I82890 Acute embolism and thrombosis of other specified veins: Secondary | ICD-10-CM | POA: Insufficient documentation

## 2019-01-31 ENCOUNTER — Emergency Department (HOSPITAL_BASED_OUTPATIENT_CLINIC_OR_DEPARTMENT_OTHER): Payer: BC Managed Care – PPO

## 2019-01-31 ENCOUNTER — Encounter (HOSPITAL_BASED_OUTPATIENT_CLINIC_OR_DEPARTMENT_OTHER): Payer: Self-pay | Admitting: Emergency Medicine

## 2019-01-31 ENCOUNTER — Emergency Department (HOSPITAL_BASED_OUTPATIENT_CLINIC_OR_DEPARTMENT_OTHER)
Admission: EM | Admit: 2019-01-31 | Discharge: 2019-01-31 | Disposition: A | Discharge status: Home / Self Care | Payer: BC Managed Care – PPO | Attending: Emergency Medicine | Admitting: Emergency Medicine

## 2019-01-31 ENCOUNTER — Other Ambulatory Visit: Payer: Self-pay

## 2019-01-31 DIAGNOSIS — Z79899 Other long term (current) drug therapy: Secondary | ICD-10-CM | POA: Insufficient documentation

## 2019-01-31 DIAGNOSIS — K5732 Diverticulitis of large intestine without perforation or abscess without bleeding: Secondary | ICD-10-CM | POA: Insufficient documentation

## 2019-01-31 DIAGNOSIS — R1032 Left lower quadrant pain: Secondary | ICD-10-CM | POA: Diagnosis present

## 2019-01-31 HISTORY — DX: Concussion with loss of consciousness status unknown, initial encounter: S06.0XAA

## 2019-01-31 HISTORY — DX: Concussion with loss of consciousness of unspecified duration, initial encounter: S06.0X9A

## 2019-01-31 LAB — CBC WITH DIFFERENTIAL/PLATELET
Abs Immature Granulocytes: 0.02 10*3/uL (ref 0.00–0.07)
Basophils Absolute: 0 10*3/uL (ref 0.0–0.1)
Basophils Relative: 1 %
Eosinophils Absolute: 0.1 10*3/uL (ref 0.0–0.5)
Eosinophils Relative: 1 %
HCT: 41 % (ref 36.0–46.0)
Hemoglobin: 13.4 g/dL (ref 12.0–15.0)
Immature Granulocytes: 1 %
Lymphocytes Relative: 22 %
Lymphs Abs: 1 10*3/uL (ref 0.7–4.0)
MCH: 29.3 pg (ref 26.0–34.0)
MCHC: 32.7 g/dL (ref 30.0–36.0)
MCV: 89.7 fL (ref 80.0–100.0)
Monocytes Absolute: 0.3 10*3/uL (ref 0.1–1.0)
Monocytes Relative: 7 %
Neutro Abs: 3 10*3/uL (ref 1.7–7.7)
Neutrophils Relative %: 68 %
Platelets: 272 10*3/uL (ref 150–400)
RBC: 4.57 MIL/uL (ref 3.87–5.11)
RDW: 12 % (ref 11.5–15.5)
WBC: 4.3 10*3/uL (ref 4.0–10.5)
nRBC: 0 % (ref 0.0–0.2)

## 2019-01-31 LAB — COMPREHENSIVE METABOLIC PANEL
ALT: 18 U/L (ref 0–44)
AST: 16 U/L (ref 15–41)
Albumin: 4.1 g/dL (ref 3.5–5.0)
Alkaline Phosphatase: 66 U/L (ref 38–126)
Anion gap: 7 (ref 5–15)
BUN: 12 mg/dL (ref 6–20)
CO2: 26 mmol/L (ref 22–32)
Calcium: 9 mg/dL (ref 8.9–10.3)
Chloride: 105 mmol/L (ref 98–111)
Creatinine, Ser: 0.83 mg/dL (ref 0.44–1.00)
GFR calc Af Amer: >60 mL/min (ref >60)
GFR calc non Af Amer: >60 mL/min (ref >60)
Glucose, Bld: 101 mg/dL — ABNORMAL HIGH (ref 70–99)
Potassium: 3.9 mmol/L (ref 3.5–5.1)
Sodium: 138 mmol/L (ref 135–145)
Total Bilirubin: 0.4 mg/dL (ref 0.3–1.2)
Total Protein: 7.4 g/dL (ref 6.5–8.1)

## 2019-01-31 LAB — LIPASE, BLOOD: Lipase: 71 U/L — ABNORMAL HIGH (ref 11–51)

## 2019-01-31 IMAGING — CT CT ABD-PELV W/ CM
2 of 5 series · 15 of 46 positions shown, 17 images · IV contrast (Omnipaque)
Comparison: None.

CLINICAL DATA: Abdominal pain with nausea and vomiting

EXAM:
CT ABDOMEN AND PELVIS WITH CONTRAST
TECHNIQUE: Multidetector CT imaging of the abdomen and pelvis was performed
using the standard protocol following bolus administration of
intravenous contrast.
CONTRAST:  100mL OMNIPAQUE IOHEXOL 300 MG/ML  SOLN

[Series 2: axial st · axial · 0.71mm/px · z∈[-442,-36]mm · 12 of 91 slices shown, 14 images]
[im 5/91  soft-tissue]
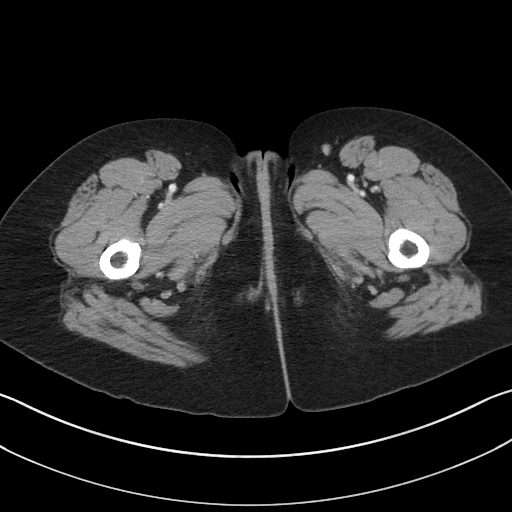
[im 5/91  bone]
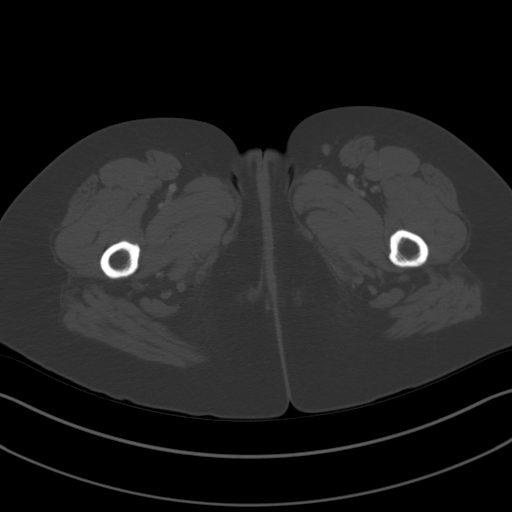
[im 15/91  soft-tissue]
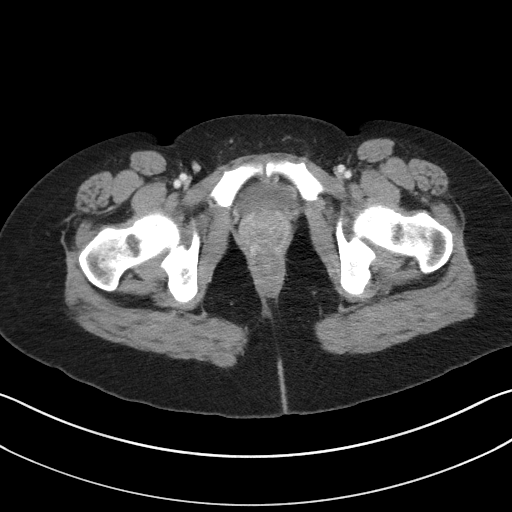
[im 19/91  soft-tissue]
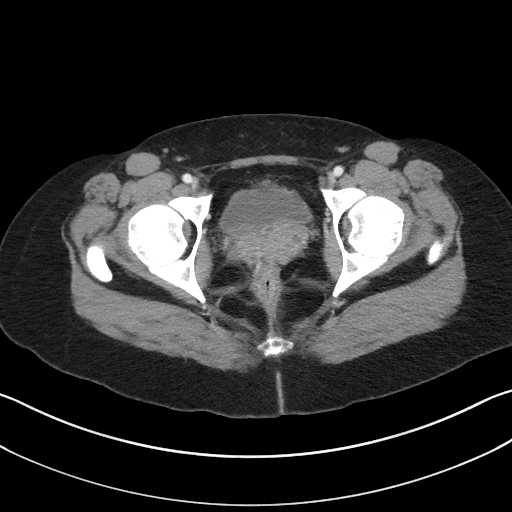
[im 29/91  soft-tissue]
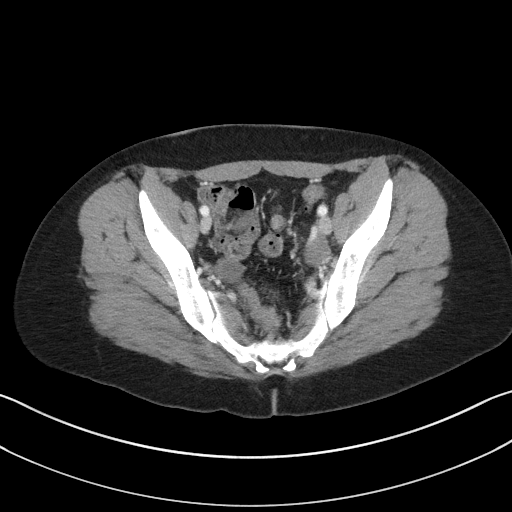
[im 34/91  soft-tissue]
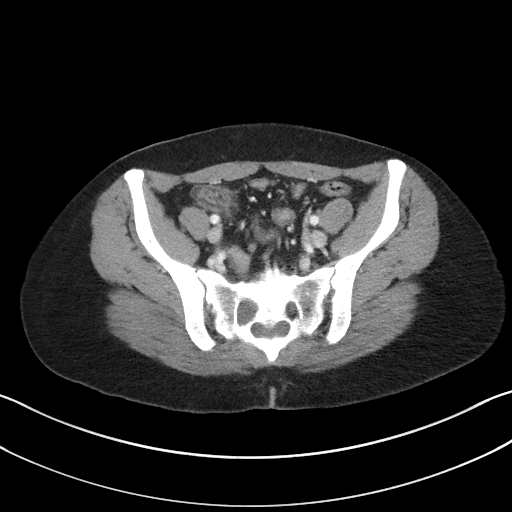
[im 43/91  soft-tissue]
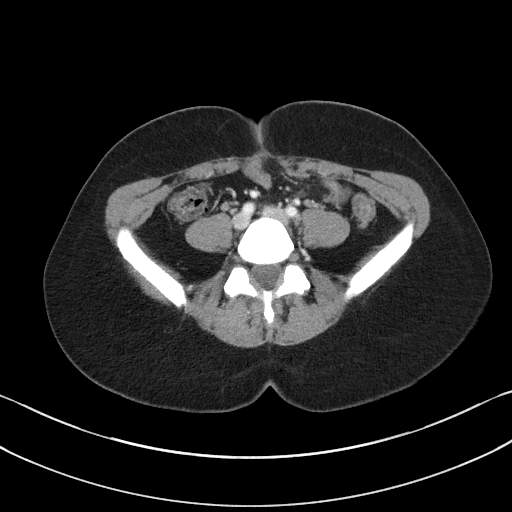
[im 48/91  soft-tissue]
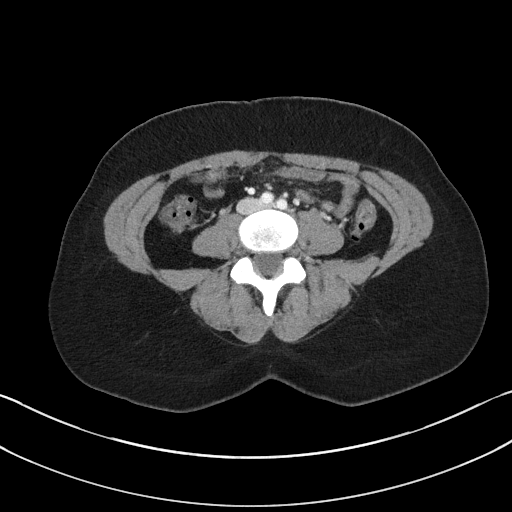
[im 57/91  soft-tissue]
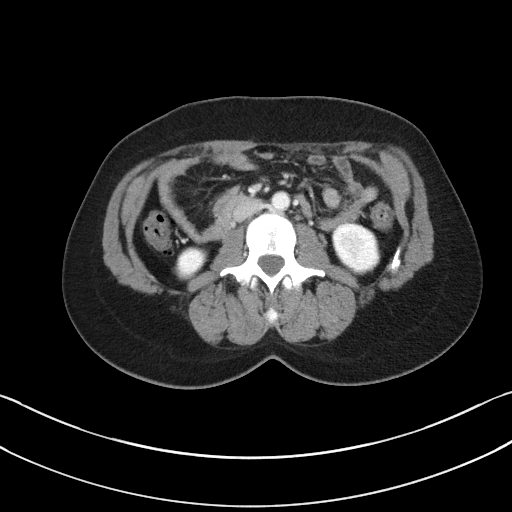
[im 62/91  soft-tissue]
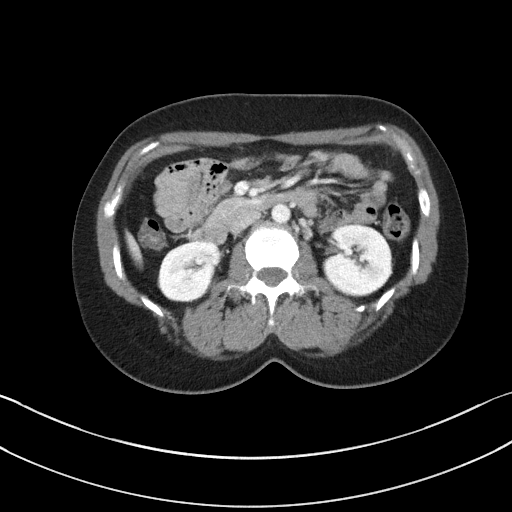
[im 62/91  bone]
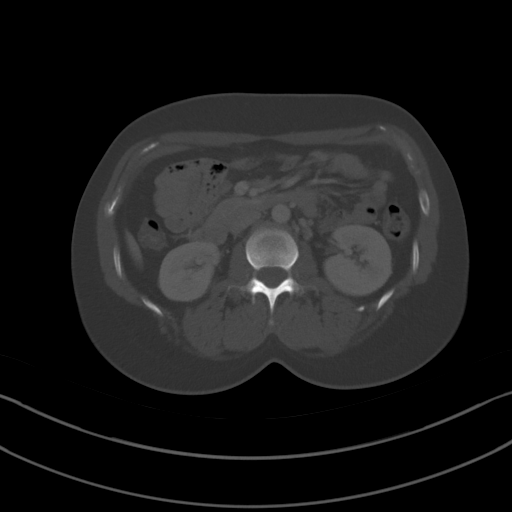
[im 72/91  soft-tissue]
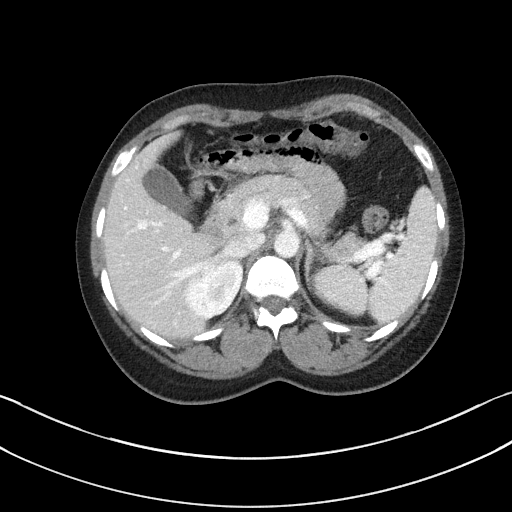
[im 76/91  soft-tissue]
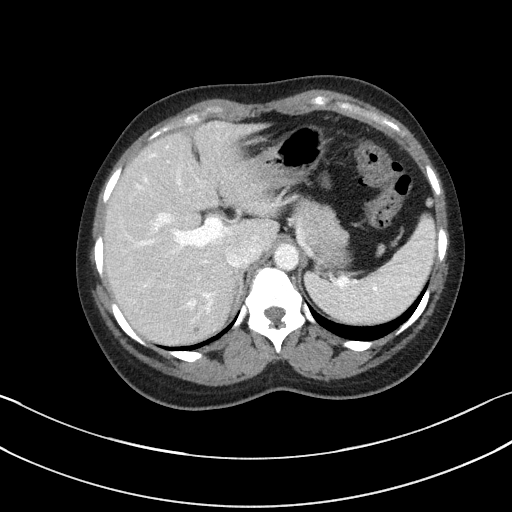
[im 86/91  soft-tissue]
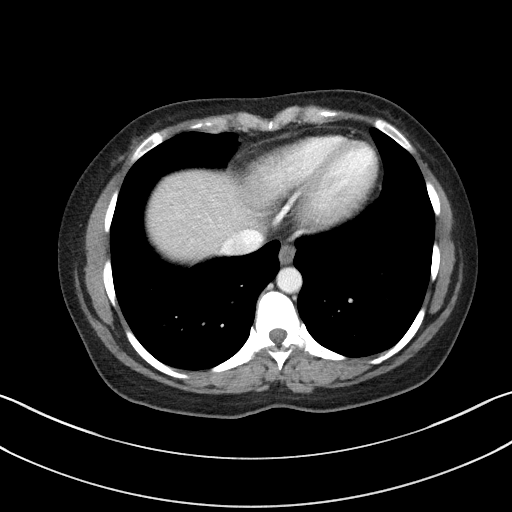

[Series 5: coronal st · coronal · 0.75mm/px · 3 of 83 slices shown]
[im 28/83  soft-tissue]
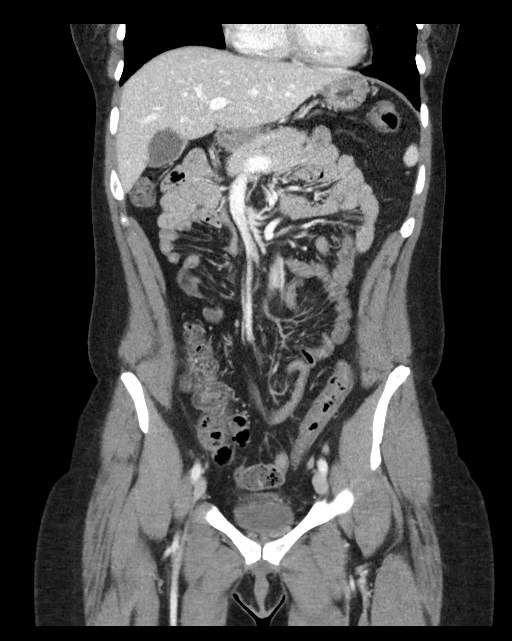
[im 37/83  soft-tissue]
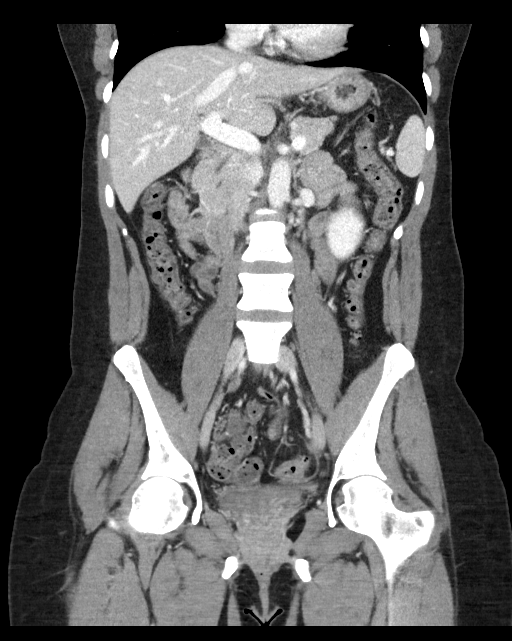
[im 46/83  soft-tissue]
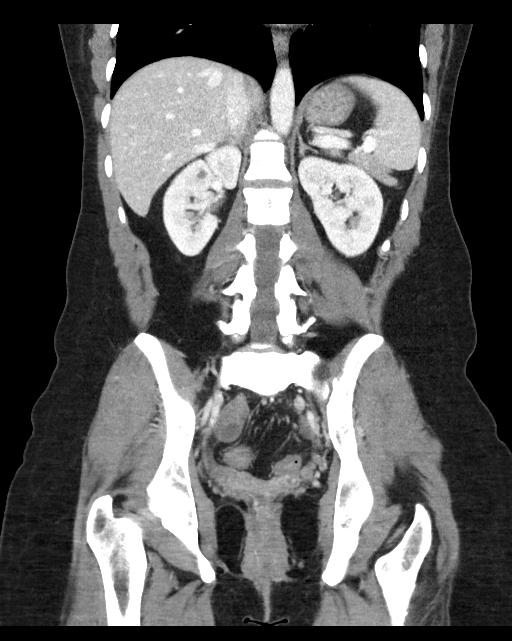

[15 of 46 positions shown; findings below may reference images not displayed]

FINDINGS: Lower chest: Lung bases are clear.

Hepatobiliary: There is a 4 mm probable cyst near the dome of the
liver on the right somewhat posteriorly. No other focal liver
lesions are evident. Gallbladder wall is not appreciably thickened.
There is no biliary duct dilatation.

Pancreas: There is no pancreatic mass or inflammatory focus.

Spleen: No splenic lesions are evident.

Adrenals/Urinary Tract: Adrenals bilaterally appear normal. There is
a focus of decreased attenuation in the upper pole of the right
kidney measuring 5 x 5 mm which is too small to be characterized
confidently as a simple cyst. There is no appreciable hydronephrosis
on either side. The appearance of the calices on the delayed images
suggests a degree of underlying medullary sponge kidney. There is a
1 mm calculus in the upper pole of the right kidney. There is no
evident ureteral calculus on either side. Urinary bladder is midline
with wall thickness within normal limits.

Stomach/Bowel: There is thickening of the wall of the mid sigmoid
colon with slight adjacent mesenteric thickening consistent with a
degree of diverticulitis in this area. There is no well-defined
fluid collection or abscess in this area. No evident perforation. No
similar bowel wall thickening noted elsewhere. There are diverticula
in the descending colon without inflammation. The terminal ileum
appears normal. There is no evident bowel obstruction. There is no
free air or portal venous air.

Vascular/Lymphatic: There is no abdominal aortic aneurysm. No
vascular lesions are demonstrable on this study. Major venous
structures appear patent. There is no adenopathy in the abdomen or
pelvis.

Reproductive: Uterus is absent.  There is no pelvic mass evident.

Other: There is no appreciable appendiceal inflammation. There is no
abscess or ascites in the abdomen or pelvis.

Musculoskeletal: No blastic or lytic bone lesions. No intramuscular
or abdominal wall lesions evident.
IMPRESSION: 1. Wall thickening with mild mesenteric thickening in the mid
sigmoid region consistent with a degree of diverticulitis. No
abscess or perforation seen in this area. No other areas of bowel
inflammatory change. There are diverticula in the descending colon
without diverticulitis in this region.

2. No bowel obstruction. No appendiceal region inflammation. No
abscess in the abdomen or pelvis.

3. 1 mm calculus in the upper pole the right kidney. No
hydronephrosis or ureteral calculus on either side.

4. 5 mm indeterminate focus of decreased attenuation in the upper
pole the right kidney. Statistically, this area most likely
represents a small cyst. Nonemergent renal ultrasound could be
helpful to confirm.

5.  Uterus absent.

## 2019-01-31 MED ORDER — ONDANSETRON HCL 4 MG/2ML IJ SOLN
4.0000 mg | Freq: Once | INTRAMUSCULAR | Status: AC
  Administered 2019-01-31: 4 mg via INTRAVENOUS
  Filled 2019-01-31: qty 2

## 2019-01-31 MED ORDER — CIPROFLOXACIN HCL 500 MG PO TABS: 500.0000 mg | ORAL_TABLET | Freq: Two times a day (BID) | ORAL | 0 refills | Status: DC

## 2019-01-31 MED ORDER — HYDROMORPHONE HCL 1 MG/ML IJ SOLN
1.0000 mg | Freq: Once | INTRAMUSCULAR | Status: AC
  Administered 2019-01-31: 1 mg via INTRAVENOUS
  Filled 2019-01-31: qty 1

## 2019-01-31 MED ORDER — HYDROCODONE-ACETAMINOPHEN 5-325 MG PO TABS: 1.0000 | ORAL_TABLET | Freq: Four times a day (QID) | ORAL | 0 refills | Status: DC | PRN

## 2019-01-31 MED ORDER — METRONIDAZOLE 500 MG PO TABS: 500.0000 mg | ORAL_TABLET | Freq: Three times a day (TID) | ORAL | 0 refills | Status: DC

## 2019-01-31 MED ORDER — PREDNISONE 10 MG PO TABS
ORAL_TABLET | ORAL | Status: AC
  Filled 2019-01-31: qty 1

## 2019-01-31 MED ORDER — ONDANSETRON 4 MG PO TBDP: 4.0000 mg | ORAL_TABLET | Freq: Three times a day (TID) | ORAL | 1 refills | Status: DC | PRN

## 2019-01-31 MED ORDER — PREDNISONE 50 MG PO TABS
ORAL_TABLET | ORAL | Status: AC
  Filled 2019-01-31: qty 1

## 2019-01-31 MED ORDER — IOHEXOL 300 MG/ML  SOLN
100.0000 mL | Freq: Once | INTRAMUSCULAR | Status: AC | PRN
  Administered 2019-01-31: 100 mL via INTRAVENOUS

## 2019-01-31 MED ORDER — SODIUM CHLORIDE 0.9 % IV SOLN: INTRAVENOUS | Status: DC

## 2019-01-31 MED FILL — ONDANSETRON ODT 4 MG TABLET: 4 | 4 days supply | Qty: 12 | Fill #0

## 2019-01-31 MED FILL — CIPROFLOXACIN HCL 500 MG TA: 500 | 7 days supply | Qty: 14 | Fill #0

## 2019-01-31 MED FILL — METRONIDAZOLE 500 MG TABS: 500 | 7 days supply | Qty: 21 | Fill #0

## 2019-01-31 NOTE — ED Provider Notes (Signed)
[facility] EMERGENCY DEPARTMENT Provider Note   CSN: [id number] Arrival date & time: 01/31/19  3474     History Chief Complaint  Patient presents with  . Abdominal Pain  . Blood In Stools    Michele Cohen is a 38 y.o. female.  Patient with a complaint of abdominal pain.  Predominantly left lower quadrant but also periumbilical in nature.  Patient's been having difficulty for about a year but the pains been a little different.  More intense pain for the past 3 to 4 days.  Patient does have a gastroenterologist in the [place] area and over the summer did have a colonoscopy without any significant findings.  Patient also states that occasionally has seen some red blood but not recently.  And a few days ago had a dark bowel movement that was kind of black in nature.  No recent red blood in the bowel movements.  Symptoms associated with nausea but no vomiting.        Past Medical History:  Diagnosis Date  . Anemia   . Concussion   . History of benign breast tumor   . Pulmonary embolism ([place])   . Stress headaches     There are no problems to display for this patient.   Past Surgical History:  Procedure Laterality Date  . ABDOMINAL HYSTERECTOMY       OB History   No obstetric history on file.     No family history on file.  Social History   Tobacco Use  . Smoking status: Never Smoker  . Smokeless tobacco: Never Used  Substance Use Topics  . Alcohol use: No    Comment: occasional  . Drug use: Never    Home Medications Prior to Admission medications   Medication Sig Start Date End Date Taking? Authorizing Provider  sertraline (ZOLOFT) 100 MG tablet Take 100 mg by mouth daily.   Yes [provider]  ciprofloxacin (CIPRO) 500 MG tablet Take 1 tablet (500 mg total) by mouth 2 (two) times daily. 01/31/19   Fredia Sorrow, MD  HYDROcodone-acetaminophen (NORCO/VICODIN) 5-325 MG tablet Take 1 tablet by mouth every 6 (six) hours as needed  for moderate pain. 01/31/19   Fredia Sorrow, MD  metroNIDAZOLE (FLAGYL) 500 MG tablet Take 1 tablet (500 mg total) by mouth 3 (three) times daily. 01/31/19   Fredia Sorrow, MD  ondansetron (ZOFRAN ODT) 4 MG disintegrating tablet Take 1 tablet (4 mg total) by mouth every 8 (eight) hours as needed. 01/31/19   Fredia Sorrow, MD    Allergies    Phenazopyridine, Shellfish allergy, and Zolpidem tartrate  Review of Systems   Review of Systems  Constitutional: Negative for chills and fever.  HENT: Negative for congestion, rhinorrhea and sore throat.   Eyes: Negative for visual disturbance.  Respiratory: Negative for cough and shortness of breath.   Cardiovascular: Negative for chest pain and leg swelling.  Gastrointestinal: Positive for abdominal pain, blood in stool and nausea. Negative for diarrhea and vomiting.  Genitourinary: Negative for dysuria.  Musculoskeletal: Negative for back pain and neck pain.  Skin: Negative for rash.  Neurological: Negative for dizziness, light-headedness and headaches.  Hematological: Does not bruise/bleed easily.  Psychiatric/Behavioral: Negative for confusion.    Physical Exam Updated Vital Signs BP 121/80 (BP Location: Left Arm)   Pulse 94   Temp 98.1 F (36.7 C) (Oral)   Resp 16   Ht 1.588 m (5' 2.5")   Wt 65.3 kg   LMP 12/15/2011   SpO2 100%  BMI 25.92 kg/m   Physical Exam Vitals and nursing note reviewed.  Constitutional:      General: She is not in acute distress.    Appearance: Normal appearance. She is well-developed.  HENT:     Head: Normocephalic and atraumatic.  Eyes:     Extraocular Movements: Extraocular movements intact.     Conjunctiva/sclera: Conjunctivae normal.     Pupils: Pupils are equal, round, and reactive to light.  Cardiovascular:     Rate and Rhythm: Normal rate and regular rhythm.     Heart sounds: No murmur.  Pulmonary:     Effort: Pulmonary effort is normal. No respiratory distress.     Breath  sounds: Normal breath sounds.  Abdominal:     General: There is distension.     Palpations: Abdomen is soft.     Tenderness: There is no abdominal tenderness. There is no guarding.     Comments: Abdomen is soft and may be slightly distended but not firm.  No significant left lower quadrant or abdominal tenderness.  Musculoskeletal:        General: No swelling. Normal range of motion.     Cervical back: Neck supple.  Skin:    General: Skin is warm and dry.  Neurological:     General: No focal deficit present.     Mental Status: She is alert and oriented to person, place, and time.     ED Results / Procedures / Treatments   Labs (all labs ordered are listed, but only abnormal results are displayed) Labs Reviewed  COMPREHENSIVE METABOLIC PANEL - Abnormal; Notable for the following components:      Result Value   Glucose, Bld 101 (*)    All other components within normal limits  LIPASE, BLOOD - Abnormal; Notable for the following components:   Lipase 71 (*)    All other components within normal limits  CBC WITH DIFFERENTIAL/PLATELET    EKG None  Radiology CT Abdomen Pelvis W Contrast  Result Date: 01/31/2019 CLINICAL DATA:  Abdominal pain with nausea and vomiting EXAM: CT ABDOMEN AND PELVIS WITH CONTRAST TECHNIQUE: Multidetector CT imaging of the abdomen and pelvis was performed using the standard protocol following bolus administration of intravenous contrast. CONTRAST:  100mL OMNIPAQUE IOHEXOL 300 MG/ML  SOLN COMPARISON:  None. FINDINGS: Lower chest: Lung bases are clear. Hepatobiliary: There is a 4 mm probable cyst near the dome of the liver on the right somewhat posteriorly. No other focal liver lesions are evident. Gallbladder wall is not appreciably thickened. There is no biliary duct dilatation. Pancreas: There is no pancreatic mass or inflammatory focus. Spleen: No splenic lesions are evident. Adrenals/Urinary Tract: Adrenals bilaterally appear normal. There is a focus of  decreased attenuation in the upper pole of the right kidney measuring 5 x 5 mm which is too small to be characterized confidently as a simple cyst. There is no appreciable hydronephrosis on either side. The appearance of the calices on the delayed images suggests a degree of underlying medullary sponge kidney. There is a 1 mm calculus in the upper pole of the right kidney. There is no evident ureteral calculus on either side. Urinary bladder is midline with wall thickness within normal limits. Stomach/Bowel: There is thickening of the wall of the mid sigmoid colon with slight adjacent mesenteric thickening consistent with a degree of diverticulitis in this area. There is no well-defined fluid collection or abscess in this area. No evident perforation. No similar bowel wall thickening noted elsewhere. There are diverticula  in the descending colon without inflammation. The terminal ileum appears normal. There is no evident bowel obstruction. There is no free air or portal venous air. Vascular/Lymphatic: There is no abdominal aortic aneurysm. No vascular lesions are demonstrable on this study. Major venous structures appear patent. There is no adenopathy in the abdomen or pelvis. Reproductive: Uterus is absent.  There is no pelvic mass evident. Other: There is no appreciable appendiceal inflammation. There is no abscess or ascites in the abdomen or pelvis. Musculoskeletal: No blastic or lytic bone lesions. No intramuscular or abdominal wall lesions evident. IMPRESSION: 1. Wall thickening with mild mesenteric thickening in the mid sigmoid region consistent with a degree of diverticulitis. No abscess or perforation seen in this area. No other areas of bowel inflammatory change. There are diverticula in the descending colon without diverticulitis in this region. 2. No bowel obstruction. No appendiceal region inflammation. No abscess in the abdomen or pelvis. 3. 1 mm calculus in the upper pole the right kidney. No  hydronephrosis or ureteral calculus on either side. 4. 5 mm indeterminate focus of decreased attenuation in the upper pole the right kidney. Statistically, this area most likely represents a small cyst. Nonemergent renal ultrasound could be helpful to confirm. 5.  Uterus absent. Electronically Signed   By: Bretta Bang III M.D.   On: 01/31/2019 10:52    Procedures Procedures (including critical care time)  Medications Ordered in ED Medications  0.9 %  sodium chloride infusion ( Intravenous New Bag/Given 01/31/19 1044)  ondansetron (ZOFRAN) injection 4 mg (4 mg Intravenous Given 01/31/19 1044)  iohexol (OMNIPAQUE) 300 MG/ML solution 100 mL (100 mLs Intravenous Contrast Given 01/31/19 1026)  HYDROmorphone (DILAUDID) injection 1 mg (1 mg Intravenous Given 01/31/19 1059)    ED Course  I have reviewed the triage vital signs and the nursing notes.  Pertinent labs & imaging results that were available during my care of the patient were reviewed by me and considered in my medical decision making (see chart for details).    MDM Rules/Calculators/A&P                       CT scan consistent with diverticulitis.  Would not explain her pain for the past year but she did have a colonoscopy over the summer.  We will go ahead and treat her with Cipro and Flagyl and have her follow back up with her gastroenterologist.  She will return for any new or worse symptoms.  No complicating factors related to the diverticulitis.  And certainly her increase in discomfort in that area for the past 3 to 4 days could be explained by the CT findings.  Patient nontoxic no acute distress.   Final Clinical Impression(s) / ED Diagnoses Final diagnoses:  Diverticulitis of large intestine without perforation or abscess without bleeding    Rx / DC Orders ED Discharge Orders         Ordered    HYDROcodone-acetaminophen (NORCO/VICODIN) 5-325 MG tablet  Every 6 hours PRN     01/31/19 1128    ondansetron (ZOFRAN  ODT) 4 MG disintegrating tablet  Every 8 hours PRN     01/31/19 1128    ciprofloxacin (CIPRO) 500 MG tablet  2 times daily     01/31/19 1128    metroNIDAZOLE (FLAGYL) 500 MG tablet  3 times daily     01/31/19 1128           Vanetta Mulders, MD 01/31/19 1134

## 2019-01-31 NOTE — Discharge Instructions (Signed)
Take the antibiotics as directed for the next 7 days.  Take the hydrocodone as needed for pain.  Take Zofran as needed for nausea and vomiting.  Would expect improvement over the next 2 to 4 days.  Would not expect you to get worse.  Return for persistent vomiting fevers worse pain.  Or if not improving after 4 days.  Do make arrangements to follow-up with your gastroenterologist.

## 2019-01-31 NOTE — ED Triage Notes (Signed)
Pt reports 2 weeks ago with abdominal pain and fainted after having BM.  For last 4 days pt has experienced nausea, abdominal pain and back pain. Today pt had 1 episode of black tarry stool with foul odor.

## 2019-04-16 ENCOUNTER — Emergency Department (HOSPITAL_BASED_OUTPATIENT_CLINIC_OR_DEPARTMENT_OTHER): Payer: BC Managed Care – PPO

## 2019-04-16 ENCOUNTER — Encounter (HOSPITAL_BASED_OUTPATIENT_CLINIC_OR_DEPARTMENT_OTHER): Payer: Self-pay | Admitting: *Deleted

## 2019-04-16 ENCOUNTER — Other Ambulatory Visit: Payer: Self-pay

## 2019-04-16 ENCOUNTER — Emergency Department (HOSPITAL_BASED_OUTPATIENT_CLINIC_OR_DEPARTMENT_OTHER)
Admission: EM | Admit: 2019-04-16 | Discharge: 2019-04-16 | Disposition: A | Discharge status: Home / Self Care | Payer: BC Managed Care – PPO | Attending: Emergency Medicine | Admitting: Emergency Medicine

## 2019-04-16 DIAGNOSIS — Z86711 Personal history of pulmonary embolism: Secondary | ICD-10-CM | POA: Diagnosis not present

## 2019-04-16 DIAGNOSIS — R079 Chest pain, unspecified: Secondary | ICD-10-CM

## 2019-04-16 DIAGNOSIS — Z79899 Other long term (current) drug therapy: Secondary | ICD-10-CM | POA: Insufficient documentation

## 2019-04-16 DIAGNOSIS — R0789 Other chest pain: Secondary | ICD-10-CM | POA: Insufficient documentation

## 2019-04-16 LAB — COMPREHENSIVE METABOLIC PANEL
ALT: 23 U/L (ref 0–44)
AST: 18 U/L (ref 15–41)
Albumin: 4 g/dL (ref 3.5–5.0)
Alkaline Phosphatase: 52 U/L (ref 38–126)
Anion gap: 8 (ref 5–15)
BUN: 11 mg/dL (ref 6–20)
CO2: 25 mmol/L (ref 22–32)
Calcium: 9.2 mg/dL (ref 8.9–10.3)
Chloride: 104 mmol/L (ref 98–111)
Creatinine, Ser: 0.74 mg/dL (ref 0.44–1.00)
GFR calc Af Amer: >60 mL/min (ref >60)
GFR calc non Af Amer: >60 mL/min (ref >60)
Glucose, Bld: 87 mg/dL (ref 70–99)
Potassium: 3.8 mmol/L (ref 3.5–5.1)
Sodium: 137 mmol/L (ref 135–145)
Total Bilirubin: 0.5 mg/dL (ref 0.3–1.2)
Total Protein: 7.3 g/dL (ref 6.5–8.1)

## 2019-04-16 LAB — PROTIME-INR
INR: 0.9 (ref 0.8–1.2)
Prothrombin Time: 12.5 seconds (ref 11.4–15.2)

## 2019-04-16 LAB — CBC WITH DIFFERENTIAL/PLATELET
Abs Immature Granulocytes: 0.04 10*3/uL (ref 0.00–0.07)
Basophils Absolute: 0 10*3/uL (ref 0.0–0.1)
Basophils Relative: 1 %
Eosinophils Absolute: 0.1 10*3/uL (ref 0.0–0.5)
Eosinophils Relative: 3 %
HCT: 41.4 % (ref 36.0–46.0)
Hemoglobin: 13.3 g/dL (ref 12.0–15.0)
Immature Granulocytes: 1 %
Lymphocytes Relative: 26 %
Lymphs Abs: 1.4 10*3/uL (ref 0.7–4.0)
MCH: 29 pg (ref 26.0–34.0)
MCHC: 32.1 g/dL (ref 30.0–36.0)
MCV: 90.4 fL (ref 80.0–100.0)
Monocytes Absolute: 0.3 10*3/uL (ref 0.1–1.0)
Monocytes Relative: 6 %
Neutro Abs: 3.3 10*3/uL (ref 1.7–7.7)
Neutrophils Relative %: 63 %
Platelets: 230 10*3/uL (ref 150–400)
RBC: 4.58 MIL/uL (ref 3.87–5.11)
RDW: 12.4 % (ref 11.5–15.5)
WBC: 5.3 10*3/uL (ref 4.0–10.5)
nRBC: 0 % (ref 0.0–0.2)

## 2019-04-16 LAB — HCG, SERUM, QUALITATIVE: Preg, Serum: NEGATIVE

## 2019-04-16 LAB — D-DIMER, QUANTITATIVE: D-Dimer, Quant: 0.48 ug/mL-FEU (ref 0.00–0.50)

## 2019-04-16 LAB — TROPONIN I (HIGH SENSITIVITY): Troponin I (High Sensitivity): <2 ng/L (ref <18)

## 2019-04-16 IMAGING — CT CT CERVICAL SPINE W/O CM
3 of 4 series · 13 of 33 positions shown, 16 images · non-contrast
Comparison: None.

CLINICAL DATA: Neck pain

EXAM:
CT CERVICAL SPINE WITHOUT CONTRAST
TECHNIQUE: Multidetector CT imaging of the cervical spine was performed without
intravenous contrast. Multiplanar CT image reconstructions were also
generated.

[Series 5: sagittal bone · sagittal · 0.27mm/px · 5 of 61 slices shown, 6 images]
[im 21/61  bone]
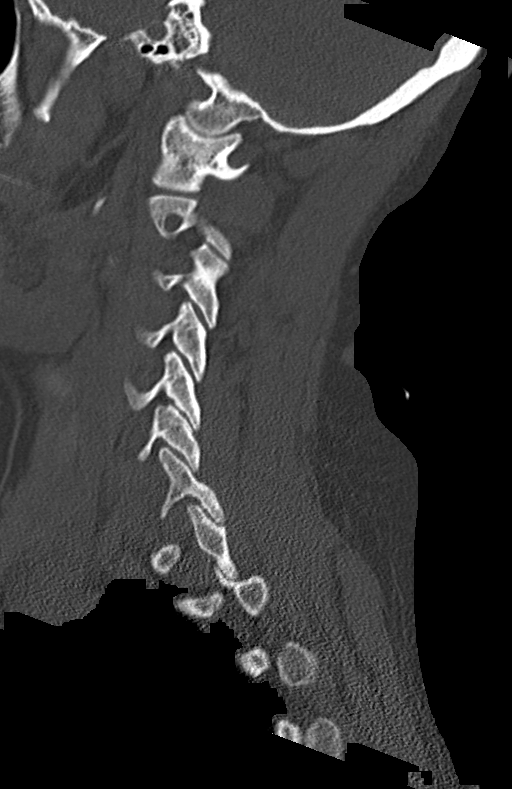
[im 26/61  bone]
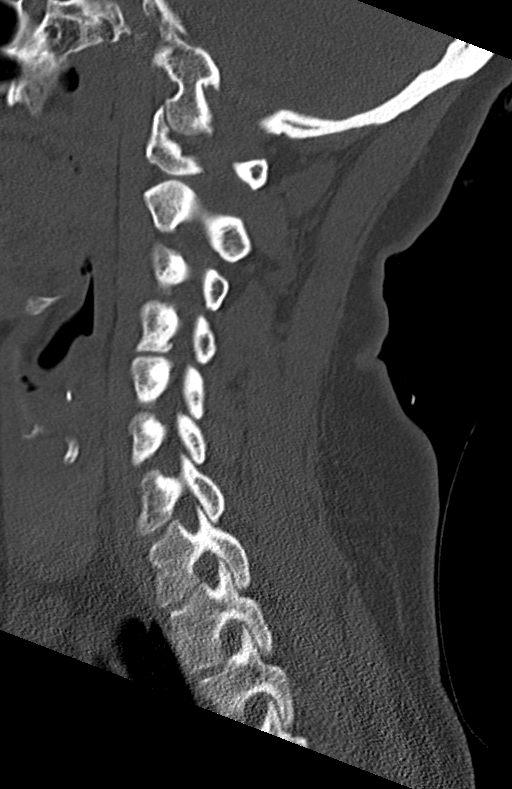
[im 31/61  soft-tissue]
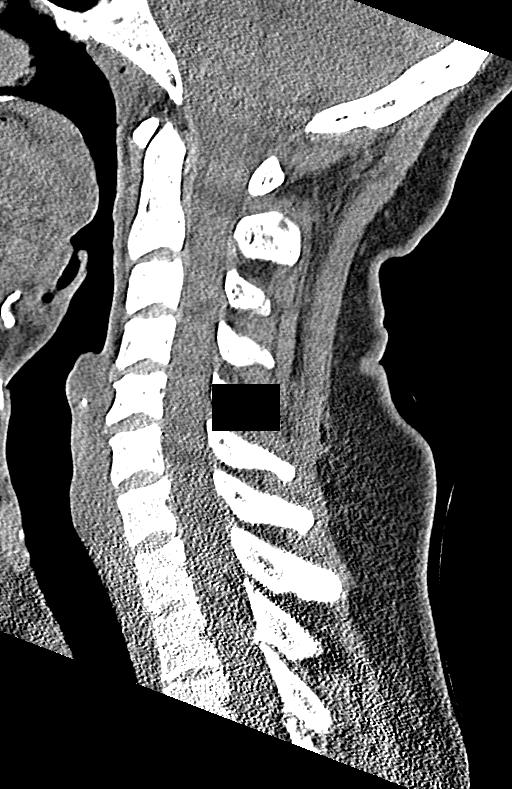
[im 31/61  bone]
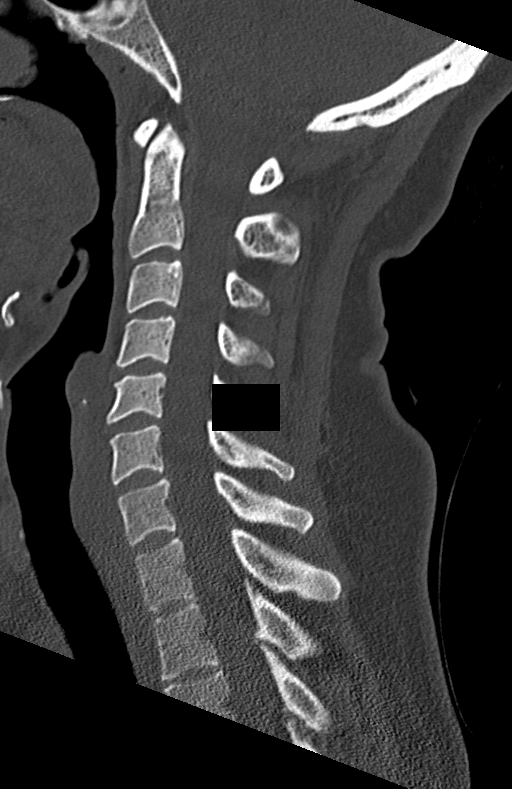
[im 36/61  bone]
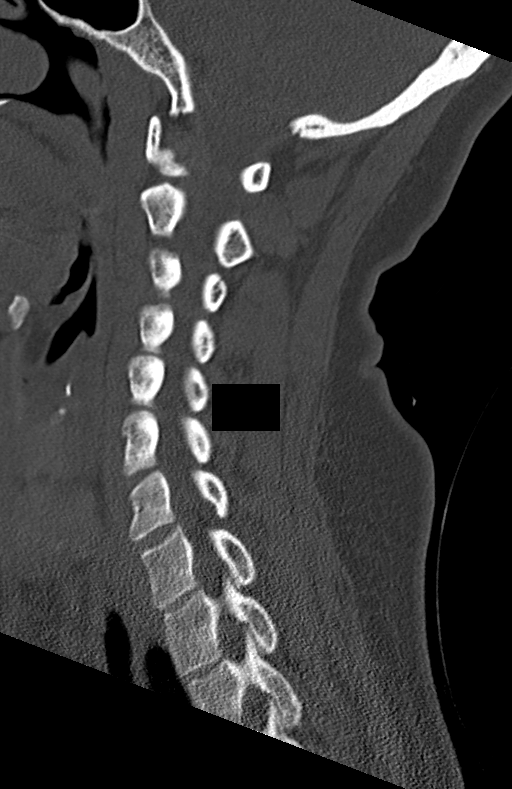
[im 41/61  bone]
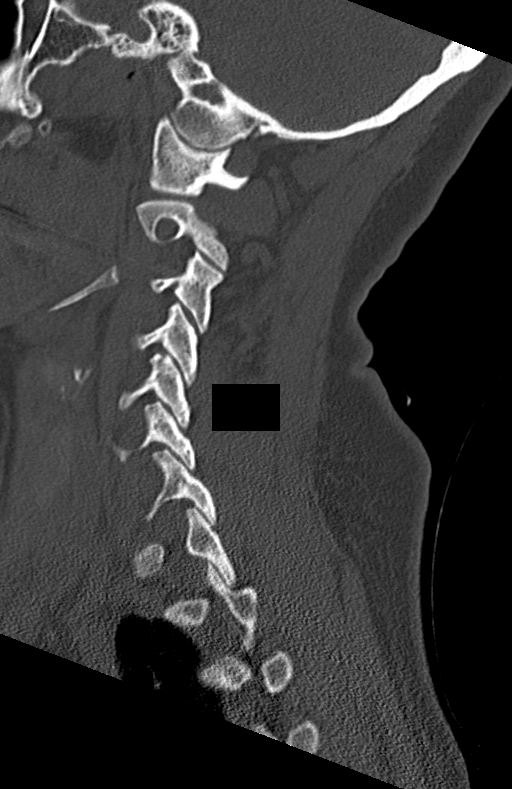

[Series 6: coronal bone · coronal · 0.29mm/px · 3 of 61 slices shown]
[im 13/61  bone]
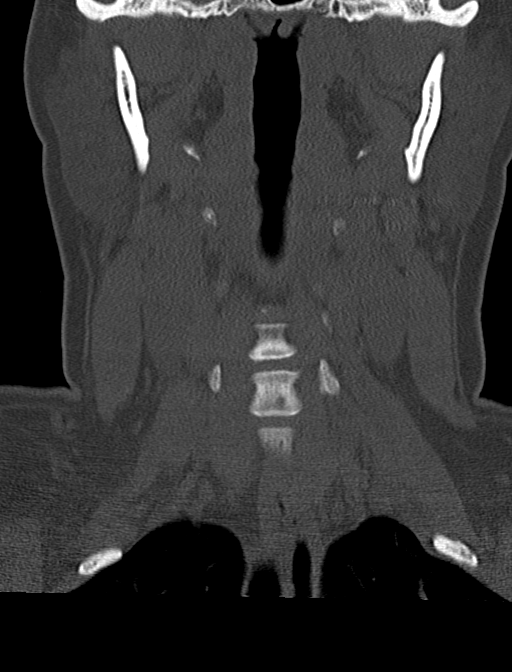
[im 25/61  bone]
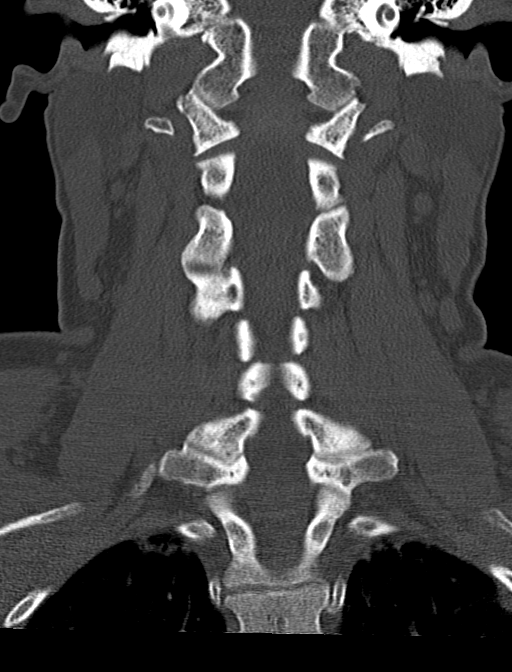
[im 37/61  bone]
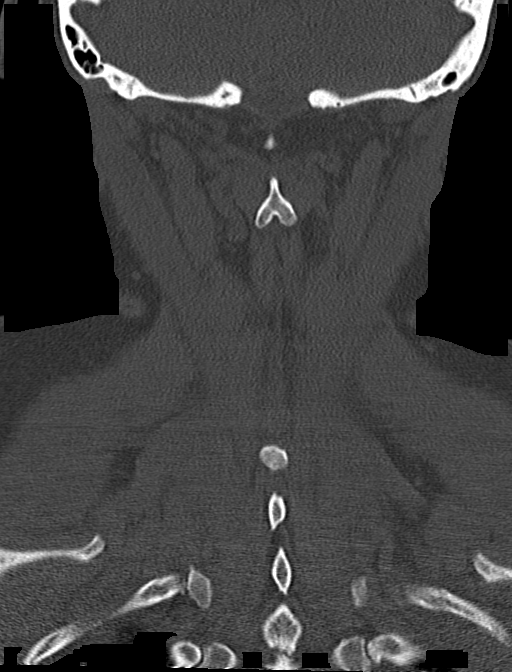

[Series 7: orthogonal bone · axial · 0.23mm/px · z∈[-439,-322]mm · 5 of 97 slices shown, 7 images]
[im 17/97  soft-tissue]
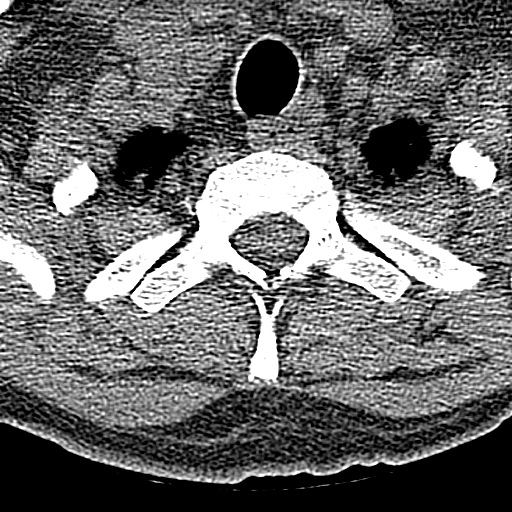
[im 17/97  bone]
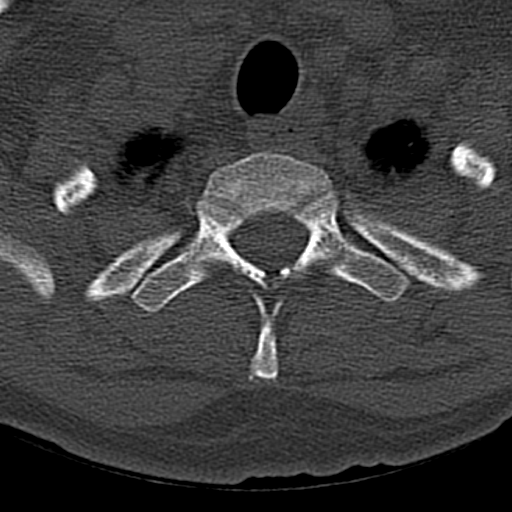
[im 33/97  bone]
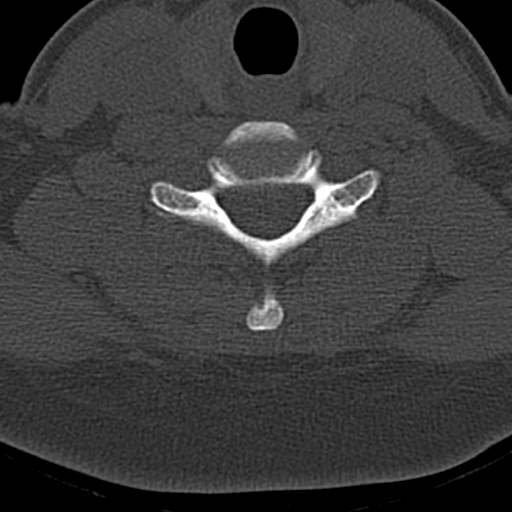
[im 49/97  bone]
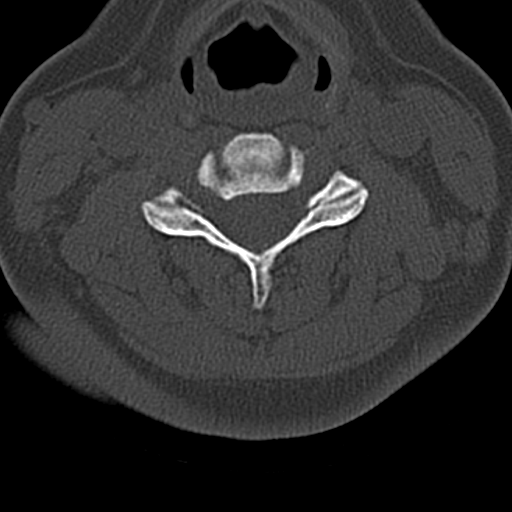
[im 65/97  bone]
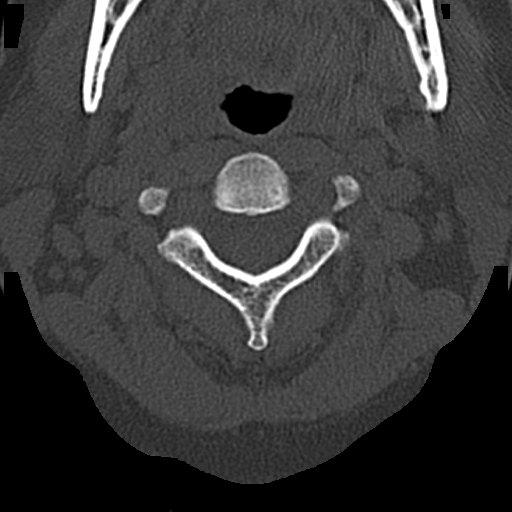
[im 81/97  soft-tissue]
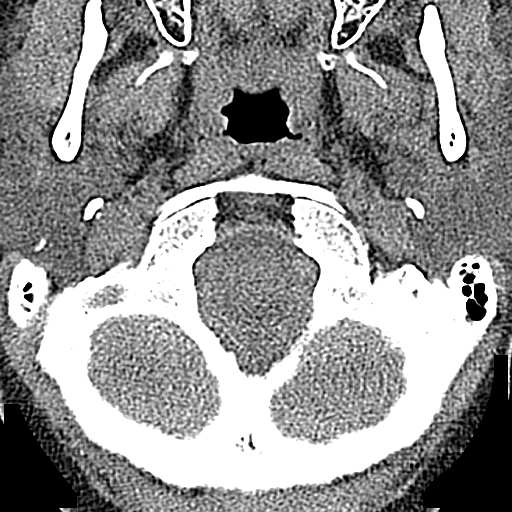
[im 81/97  bone]
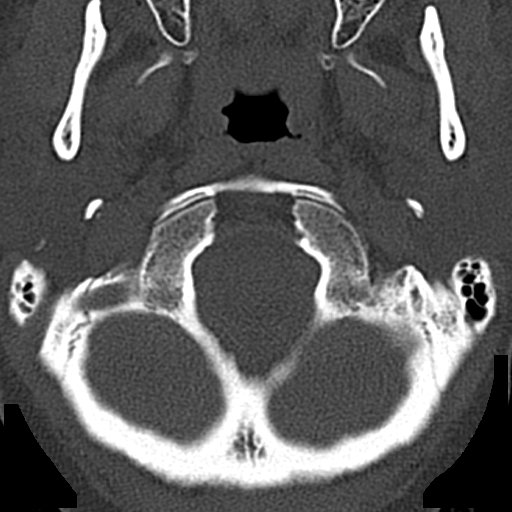

[13 of 33 positions shown; findings below may reference images not displayed]

FINDINGS: Alignment: Normal.

Skull base and vertebrae: No acute fracture. No primary bone lesion
or focal pathologic process.

Soft tissues and spinal canal: No prevertebral fluid or swelling. No
visible canal hematoma.

Disc levels:  Within normal limits

Upper chest: Negative.

Other: None
IMPRESSION: Negative.  No acute osseous abnormality.

## 2019-04-16 IMAGING — CT CT ANGIO CHEST
2 of 9 series · 19 of 36 positions shown · IV contrast (Omnipaque)
Comparison: Chest x-ray [DATE]

CLINICAL DATA: Shortness of breath

EXAM:
CT ANGIOGRAPHY CHEST WITH CONTRAST
TECHNIQUE: Multidetector CT imaging of the chest was performed using the
standard protocol during bolus administration of intravenous
contrast. Multiplanar CT image reconstructions and MIPs were
obtained to evaluate the vascular anatomy.
CONTRAST:  100mL OMNIPAQUE IOHEXOL 350 MG/ML SOLN

[Series 9: pe coronal mpr · coronal · 0.48mm/px · 1 of 122 slices shown]
[im 61/122  mediastinal]
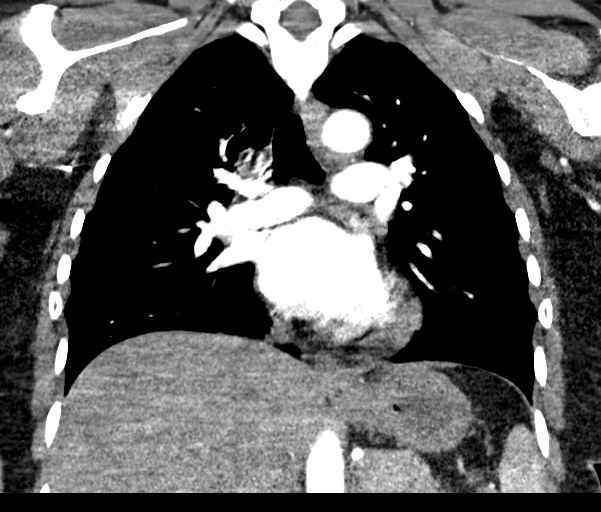

[Series 13: pe thins · axial · 0.60mm/px · z∈[-628,-384]mm · 18 of 274 slices shown]
[im 15/274  lung]
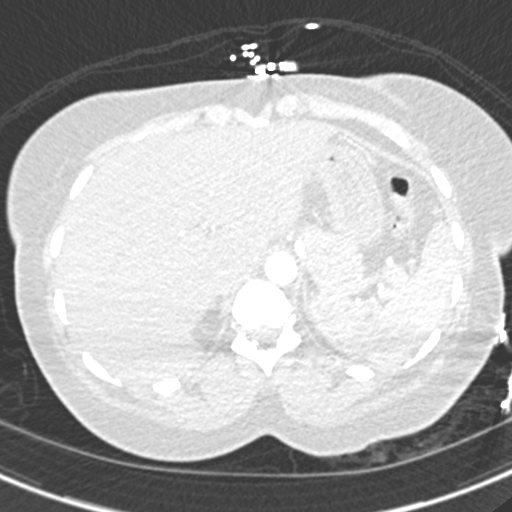
[im 29/274  mediastinal]
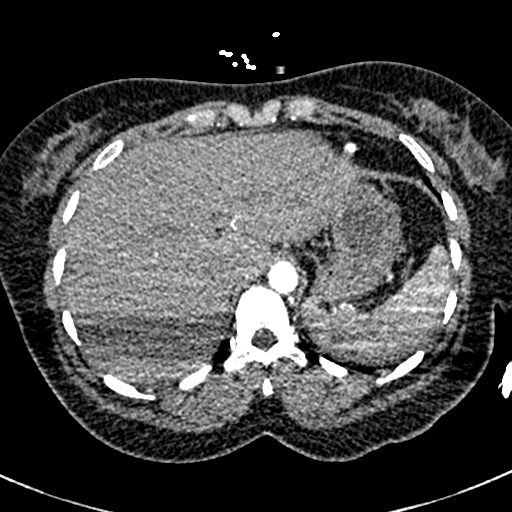
[im 44/274  lung]
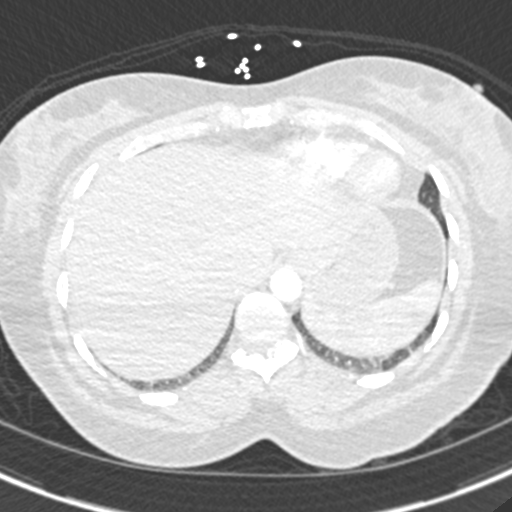
[im 58/274  mediastinal]
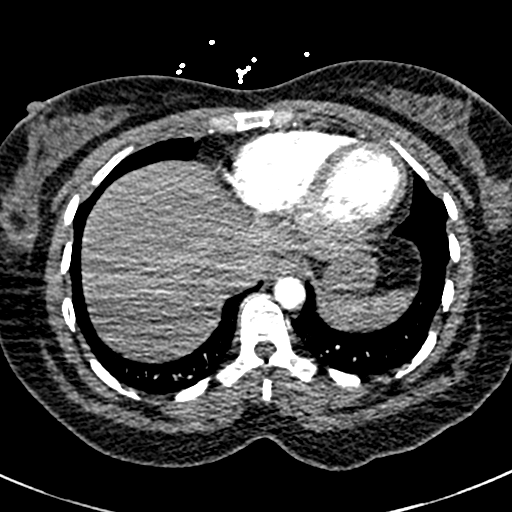
[im 72/274  lung]
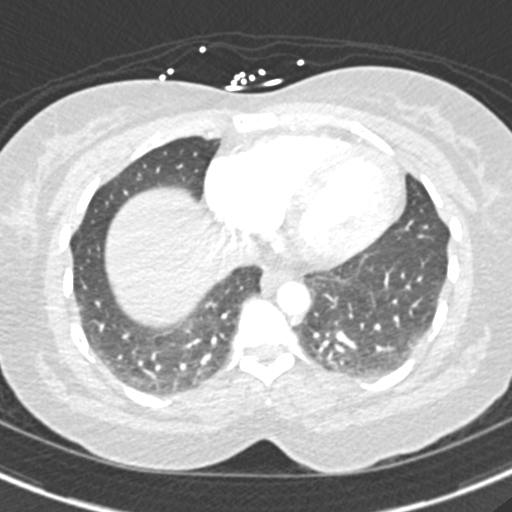
[im 87/274  mediastinal]
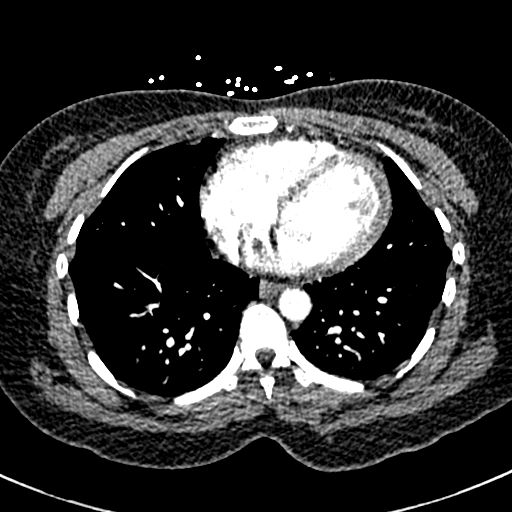
[im 101/274  lung]
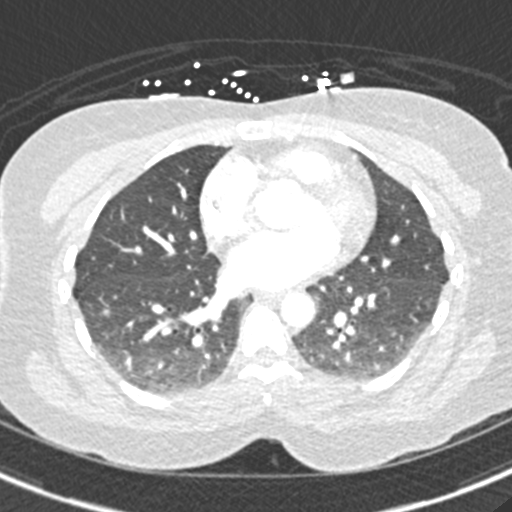
[im 115/274  mediastinal]
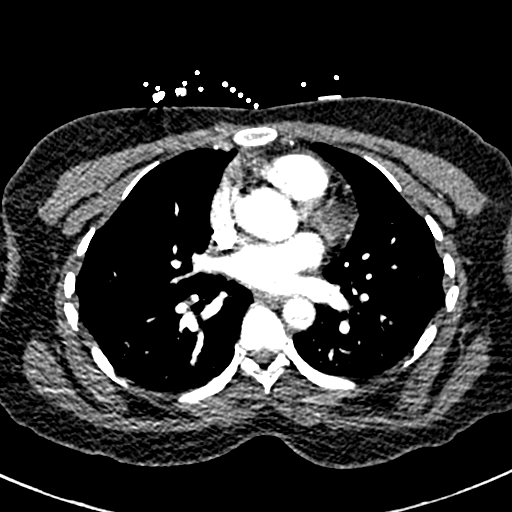
[im 130/274  lung]
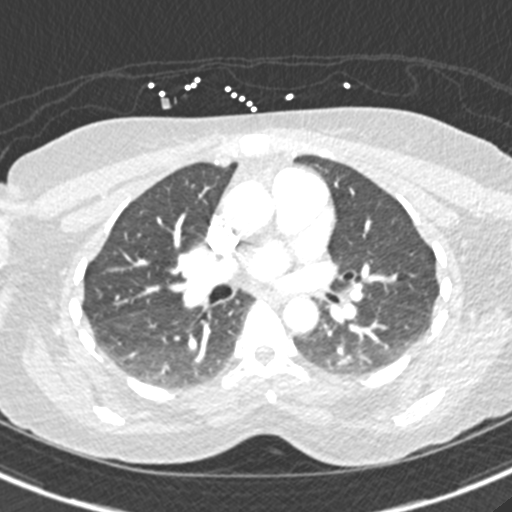
[im 144/274  mediastinal]
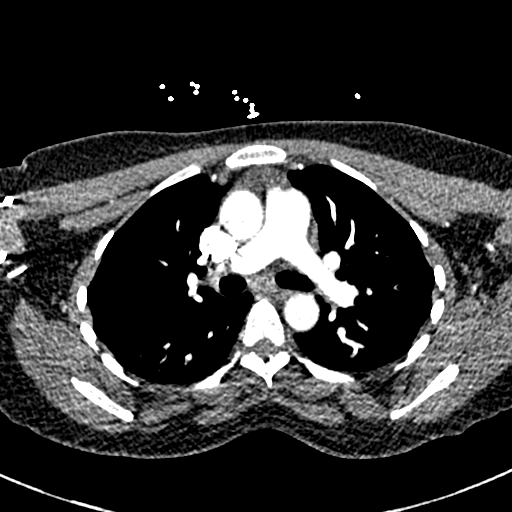
[im 159/274  lung]
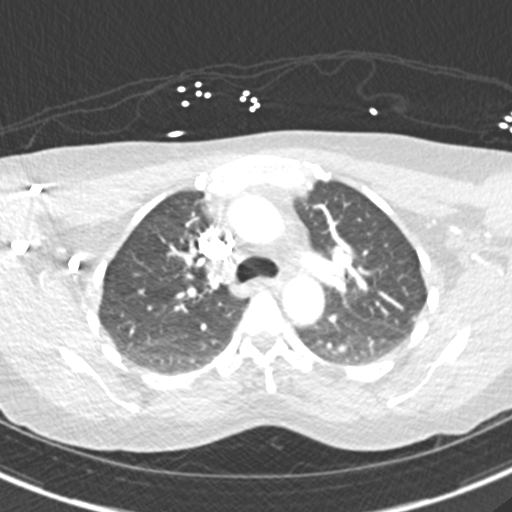
[im 173/274  mediastinal]
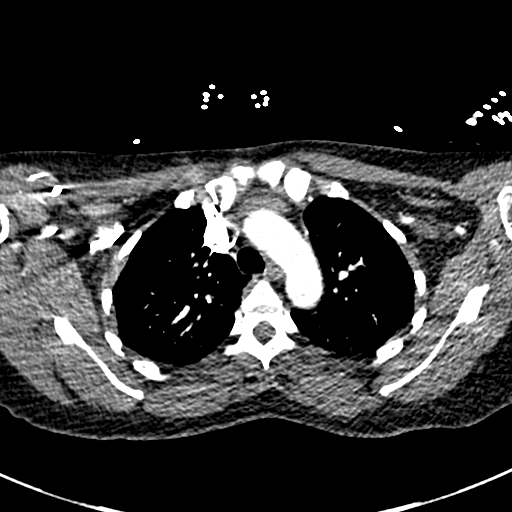
[im 187/274  lung]
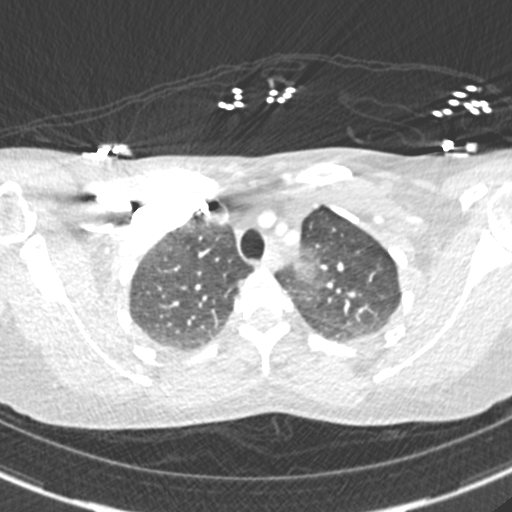
[im 202/274  mediastinal]
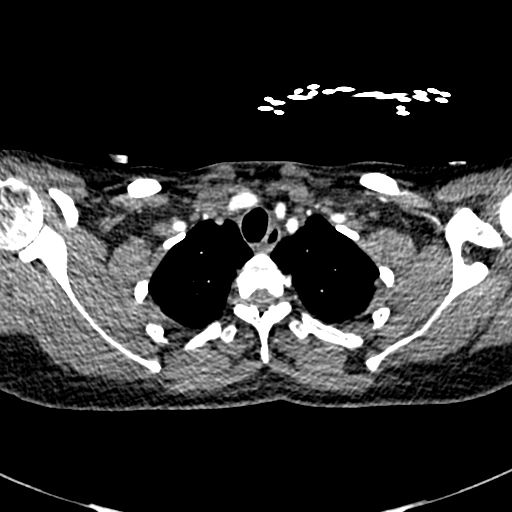
[im 216/274  lung]
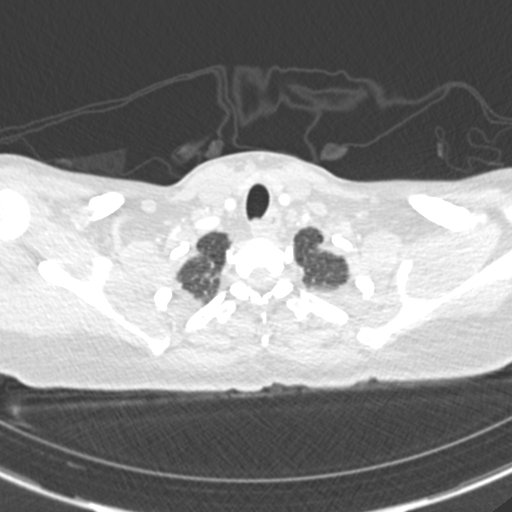
[im 230/274  mediastinal]
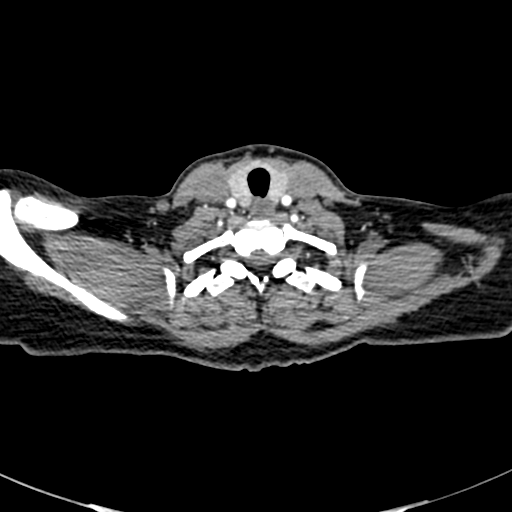
[im 245/274  lung]
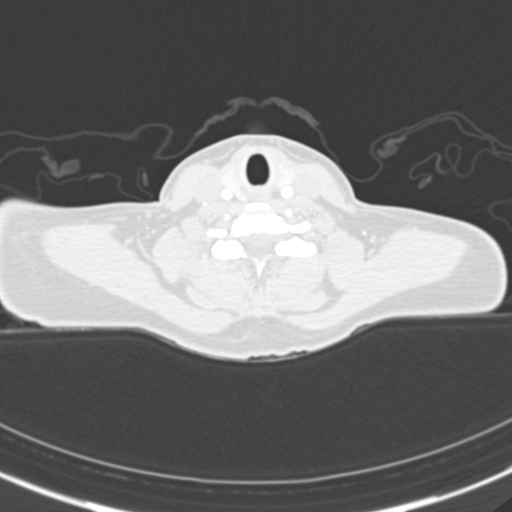
[im 259/274  mediastinal]
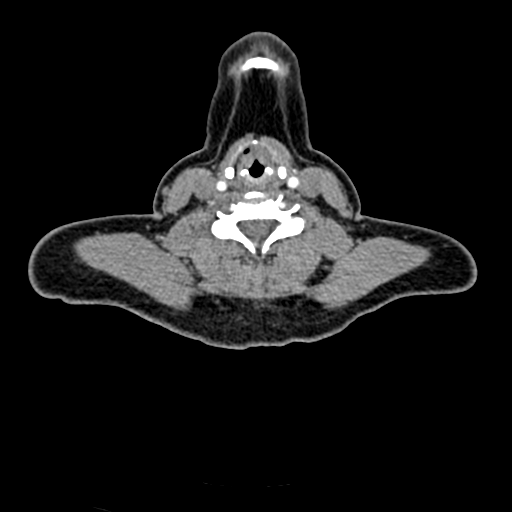

[19 of 36 positions shown; findings below may reference images not displayed]

FINDINGS: Cardiovascular: Satisfactory opacification of the pulmonary arteries
to the segmental level. No evidence of pulmonary embolism. Normal
heart size. No pericardial effusion. Nonaneurysmal aorta. No
dissection is seen.

Mediastinum/Nodes: No enlarged mediastinal, hilar, or axillary lymph
nodes. Thyroid gland, trachea, and esophagus demonstrate no
significant findings.

Lungs/Pleura: Lungs are clear. No pleural effusion or pneumothorax.

Upper Abdomen: No acute abnormality.

Musculoskeletal: No chest wall abnormality. No acute or significant
osseous findings.

Review of the MIP images confirms the above findings.
IMPRESSION: Negative. No CT evidence for acute pulmonary embolus or dissection.
Clear lung fields

## 2019-04-16 MED ORDER — SODIUM CHLORIDE 0.9 % IV BOLUS
1000.0000 mL | Freq: Once | INTRAVENOUS | Status: AC
  Administered 2019-04-16: 1000 mL via INTRAVENOUS

## 2019-04-16 MED ORDER — IOHEXOL 350 MG/ML SOLN
100.0000 mL | Freq: Once | INTRAVENOUS | Status: AC | PRN
  Administered 2019-04-16: 100 mL via INTRAVENOUS

## 2019-04-16 NOTE — Discharge Instructions (Addendum)
Begin taking ibuprofen 600 mg every 6 hours as needed for pain.  Follow-up with your primary doctor if not improving in the next 3 to 4 days, and return to the ER if you develop worsening pain, difficulty breathing, or other new and concerning symptoms.

## 2019-04-16 NOTE — ED Provider Notes (Signed)
[facility] EMERGENCY DEPARTMENT Provider Note   CSN: [id number] Arrival date & time: 04/16/19  1600     History Chief Complaint  Patient presents with  . Shortness of Breath    Michele Cohen is a 39 y.o. female.  Patient is a 39 year old female with past medical history of prior PE, anemia.  She presents today for evaluation of chest discomfort and shortness of breath.  This started yesterday morning when she woke from sleep suddenly feeling as if she was being smothered.  Patient since then has been experiencing pleuritic chest discomfort and shortness of breath.  She was previously on Xarelto, however this was stopped nearly 1 year ago.  She also reports some nonproductive cough and neck pain.  She denies fevers or chills.  She denies any pain or swelling in her feet or ankles.  The history is provided by the patient.  Shortness of Breath Severity:  Moderate Timing:  Constant Progression:  Worsening Chronicity:  New Relieved by:  Nothing Worsened by:  Nothing Ineffective treatments:  None tried Associated symptoms: no fever        Past Medical History:  Diagnosis Date  . Anemia   . Concussion   . History of benign breast tumor   . Pulmonary embolism ([place])   . Stress headaches     There are no problems to display for this patient.   Past Surgical History:  Procedure Laterality Date  . ABDOMINAL HYSTERECTOMY       OB History   No obstetric history on file.     No family history on file.  Social History   Tobacco Use  . Smoking status: Never Smoker  . Smokeless tobacco: Never Used  Substance Use Topics  . Alcohol use: No    Comment: occasional  . Drug use: Never    Home Medications Prior to Admission medications   Medication Sig Start Date End Date Taking? Authorizing Provider  ciprofloxacin (CIPRO) 500 MG tablet Take 1 tablet (500 mg total) by mouth 2 (two) times daily. 01/31/19   Fredia Sorrow, MD  HYDROcodone-acetaminophen  (NORCO/VICODIN) 5-325 MG tablet Take 1 tablet by mouth every 6 (six) hours as needed for moderate pain. 01/31/19   Fredia Sorrow, MD  metroNIDAZOLE (FLAGYL) 500 MG tablet Take 1 tablet (500 mg total) by mouth 3 (three) times daily. 01/31/19   Fredia Sorrow, MD  ondansetron (ZOFRAN ODT) 4 MG disintegrating tablet Take 1 tablet (4 mg total) by mouth every 8 (eight) hours as needed. 01/31/19   Fredia Sorrow, MD  sertraline (ZOLOFT) 100 MG tablet Take 100 mg by mouth daily.    [provider]    Allergies    Phenazopyridine, Shellfish allergy, and Zolpidem tartrate  Review of Systems   Review of Systems  Constitutional: Negative for fever.  Respiratory: Positive for shortness of breath.   All other systems reviewed and are negative.   Physical Exam Updated Vital Signs BP 125/78   Pulse 85   Temp 98.2 F (36.8 C) (Oral)   Resp 16   Ht 5' 2.5" (1.588 m)   Wt 68 kg   LMP 12/15/2011   SpO2 100%   BMI 27.00 kg/m   Physical Exam Vitals and nursing note reviewed.  Constitutional:      General: She is not in acute distress.    Appearance: She is well-developed. She is not diaphoretic.  HENT:     Head: Normocephalic and atraumatic.  Cardiovascular:     Rate and Rhythm:  Normal rate and regular rhythm.     Heart sounds: No murmur. No friction rub. No gallop.   Pulmonary:     Effort: Pulmonary effort is normal. No respiratory distress.     Breath sounds: Normal breath sounds. No wheezing.  Abdominal:     General: Bowel sounds are normal. There is no distension.     Palpations: Abdomen is soft.     Tenderness: There is no abdominal tenderness.  Musculoskeletal:        General: Normal range of motion.     Cervical back: Normal range of motion and neck supple.     Right lower leg: No tenderness. No edema.     Left lower leg: No tenderness. No edema.     Comments: Denna Haggard' sign is absent bilaterally.  Skin:    General: Skin is warm and dry.  Neurological:      Mental Status: She is alert and oriented to person, place, and time.     ED Results / Procedures / Treatments   Labs (all labs ordered are listed, but only abnormal results are displayed) Labs Reviewed  COMPREHENSIVE METABOLIC PANEL  CBC WITH DIFFERENTIAL/PLATELET  PROTIME-INR  D-DIMER, QUANTITATIVE (NOT AT [facility])  HCG, SERUM, QUALITATIVE    EKG ED ECG REPORT   Date: 04/16/2019  Rate: 82  Rhythm: normal sinus rhythm  QRS Axis: normal  Intervals: normal  ST/T Wave abnormalities: early repolarization  Conduction Disutrbances:none  Narrative Interpretation:   Old EKG Reviewed: none available       I have personally reviewed the EKG tracing and agree with the computerized printout as noted.   Radiology No results found.  Procedures Procedures (including critical care time)  Medications Ordered in ED Medications  sodium chloride 0.9 % bolus 1,000 mL (has no administration in time range)    ED Course  I have reviewed the triage vital signs and the nursing notes.  Pertinent labs & imaging results that were available during my care of the patient were reviewed by me and considered in my medical decision making (see chart for details).    MDM Rules/Calculators/A&P  Patient is a 39 year old female with history of prior pulmonary embolism.  She presents today with complaints of chest pain.  This woke her from sleep yesterday morning and she has felt short of breath since.  Her vital signs are stable and oxygen saturations are 100%.  Her CT scan shows no evidence for PE and D-dimer is negative.  No other alternative diagnoses have been found in her work-up.  At this point, I feel as though discharge is appropriate.  Patient to take ibuprofen as needed for pain and follow-up with primary doctor as needed.  Final Clinical Impression(s) / ED Diagnoses Final diagnoses:  None    Rx / DC Orders ED Discharge Orders    None       Geoffery Lyons, MD 04/16/19 [phone]

## 2019-04-16 NOTE — ED Triage Notes (Signed)
Right shoulder pain for a week. Yesterday she woke with SOB and gasping for air that resolved. She has neck pain, tingling in her fingers of both hands on and off since. She has a hx of numerous blood clots after having similar symptoms 2 years ago. Her Xarelto was stopped a year ago.

## 2019-04-27 ENCOUNTER — Ambulatory Visit: Payer: BC Managed Care – PPO

## 2019-06-06 ENCOUNTER — Emergency Department (HOSPITAL_BASED_OUTPATIENT_CLINIC_OR_DEPARTMENT_OTHER)
Admission: EM | Admit: 2019-06-06 | Discharge: 2019-06-06 | Disposition: A | Discharge status: Home / Self Care | Payer: BC Managed Care – PPO | Attending: Emergency Medicine | Admitting: Emergency Medicine

## 2019-06-06 ENCOUNTER — Emergency Department (HOSPITAL_BASED_OUTPATIENT_CLINIC_OR_DEPARTMENT_OTHER): Payer: BC Managed Care – PPO

## 2019-06-06 ENCOUNTER — Other Ambulatory Visit: Payer: Self-pay

## 2019-06-06 ENCOUNTER — Encounter (HOSPITAL_BASED_OUTPATIENT_CLINIC_OR_DEPARTMENT_OTHER): Payer: Self-pay | Admitting: *Deleted

## 2019-06-06 DIAGNOSIS — Z79899 Other long term (current) drug therapy: Secondary | ICD-10-CM | POA: Diagnosis not present

## 2019-06-06 DIAGNOSIS — U071 COVID-19: Secondary | ICD-10-CM | POA: Diagnosis not present

## 2019-06-06 DIAGNOSIS — R0602 Shortness of breath: Secondary | ICD-10-CM | POA: Diagnosis present

## 2019-06-06 DIAGNOSIS — R519 Headache, unspecified: Secondary | ICD-10-CM | POA: Diagnosis not present

## 2019-06-06 DIAGNOSIS — R079 Chest pain, unspecified: Secondary | ICD-10-CM | POA: Diagnosis not present

## 2019-06-06 DIAGNOSIS — M79604 Pain in right leg: Secondary | ICD-10-CM | POA: Diagnosis not present

## 2019-06-06 DIAGNOSIS — R05 Cough: Secondary | ICD-10-CM | POA: Diagnosis not present

## 2019-06-06 DIAGNOSIS — R52 Pain, unspecified: Secondary | ICD-10-CM

## 2019-06-06 LAB — CBC WITH DIFFERENTIAL/PLATELET
Abs Immature Granulocytes: 0.07 10*3/uL (ref 0.00–0.07)
Basophils Absolute: 0 10*3/uL (ref 0.0–0.1)
Basophils Relative: 0 %
Eosinophils Absolute: 0 10*3/uL (ref 0.0–0.5)
Eosinophils Relative: 1 %
HCT: 38.6 % (ref 36.0–46.0)
Hemoglobin: 13.1 g/dL (ref 12.0–15.0)
Immature Granulocytes: 1 %
Lymphocytes Relative: 16 %
Lymphs Abs: 0.8 10*3/uL (ref 0.7–4.0)
MCH: 29.1 pg (ref 26.0–34.0)
MCHC: 33.9 g/dL (ref 30.0–36.0)
MCV: 85.8 fL (ref 80.0–100.0)
Monocytes Absolute: 0.3 10*3/uL (ref 0.1–1.0)
Monocytes Relative: 6 %
Neutro Abs: 3.9 10*3/uL (ref 1.7–7.7)
Neutrophils Relative %: 76 %
Platelets: 282 10*3/uL (ref 150–400)
RBC: 4.5 MIL/uL (ref 3.87–5.11)
RDW: 11.9 % (ref 11.5–15.5)
WBC: 5.1 10*3/uL (ref 4.0–10.5)
nRBC: 0 % (ref 0.0–0.2)

## 2019-06-06 LAB — COMPREHENSIVE METABOLIC PANEL
ALT: 17 U/L (ref 0–44)
AST: 16 U/L (ref 15–41)
Albumin: 4.2 g/dL (ref 3.5–5.0)
Alkaline Phosphatase: 60 U/L (ref 38–126)
Anion gap: 8 (ref 5–15)
BUN: 12 mg/dL (ref 6–20)
CO2: 26 mmol/L (ref 22–32)
Calcium: 8.9 mg/dL (ref 8.9–10.3)
Chloride: 102 mmol/L (ref 98–111)
Creatinine, Ser: 0.58 mg/dL (ref 0.44–1.00)
GFR calc Af Amer: >60 mL/min (ref >60)
GFR calc non Af Amer: >60 mL/min (ref >60)
Glucose, Bld: 116 mg/dL — ABNORMAL HIGH (ref 70–99)
Potassium: 3.7 mmol/L (ref 3.5–5.1)
Sodium: 136 mmol/L (ref 135–145)
Total Bilirubin: 0.3 mg/dL (ref 0.3–1.2)
Total Protein: 7.2 g/dL (ref 6.5–8.1)

## 2019-06-06 LAB — TROPONIN I (HIGH SENSITIVITY): Troponin I (High Sensitivity): <2 ng/L (ref <18)

## 2019-06-06 LAB — D-DIMER, QUANTITATIVE: D-Dimer, Quant: 0.41 ug/mL-FEU (ref 0.00–0.50)

## 2019-06-06 IMAGING — DX DG CHEST 1V PORT
1 series · 1 of 1 positions shown · non-contrast
Comparison: Chest CTA [DATE], radiograph [DATE]

CLINICAL DATA: Shortness of breath. COVID positive.

EXAM:
PORTABLE CHEST 1 VIEW

[chest ap]
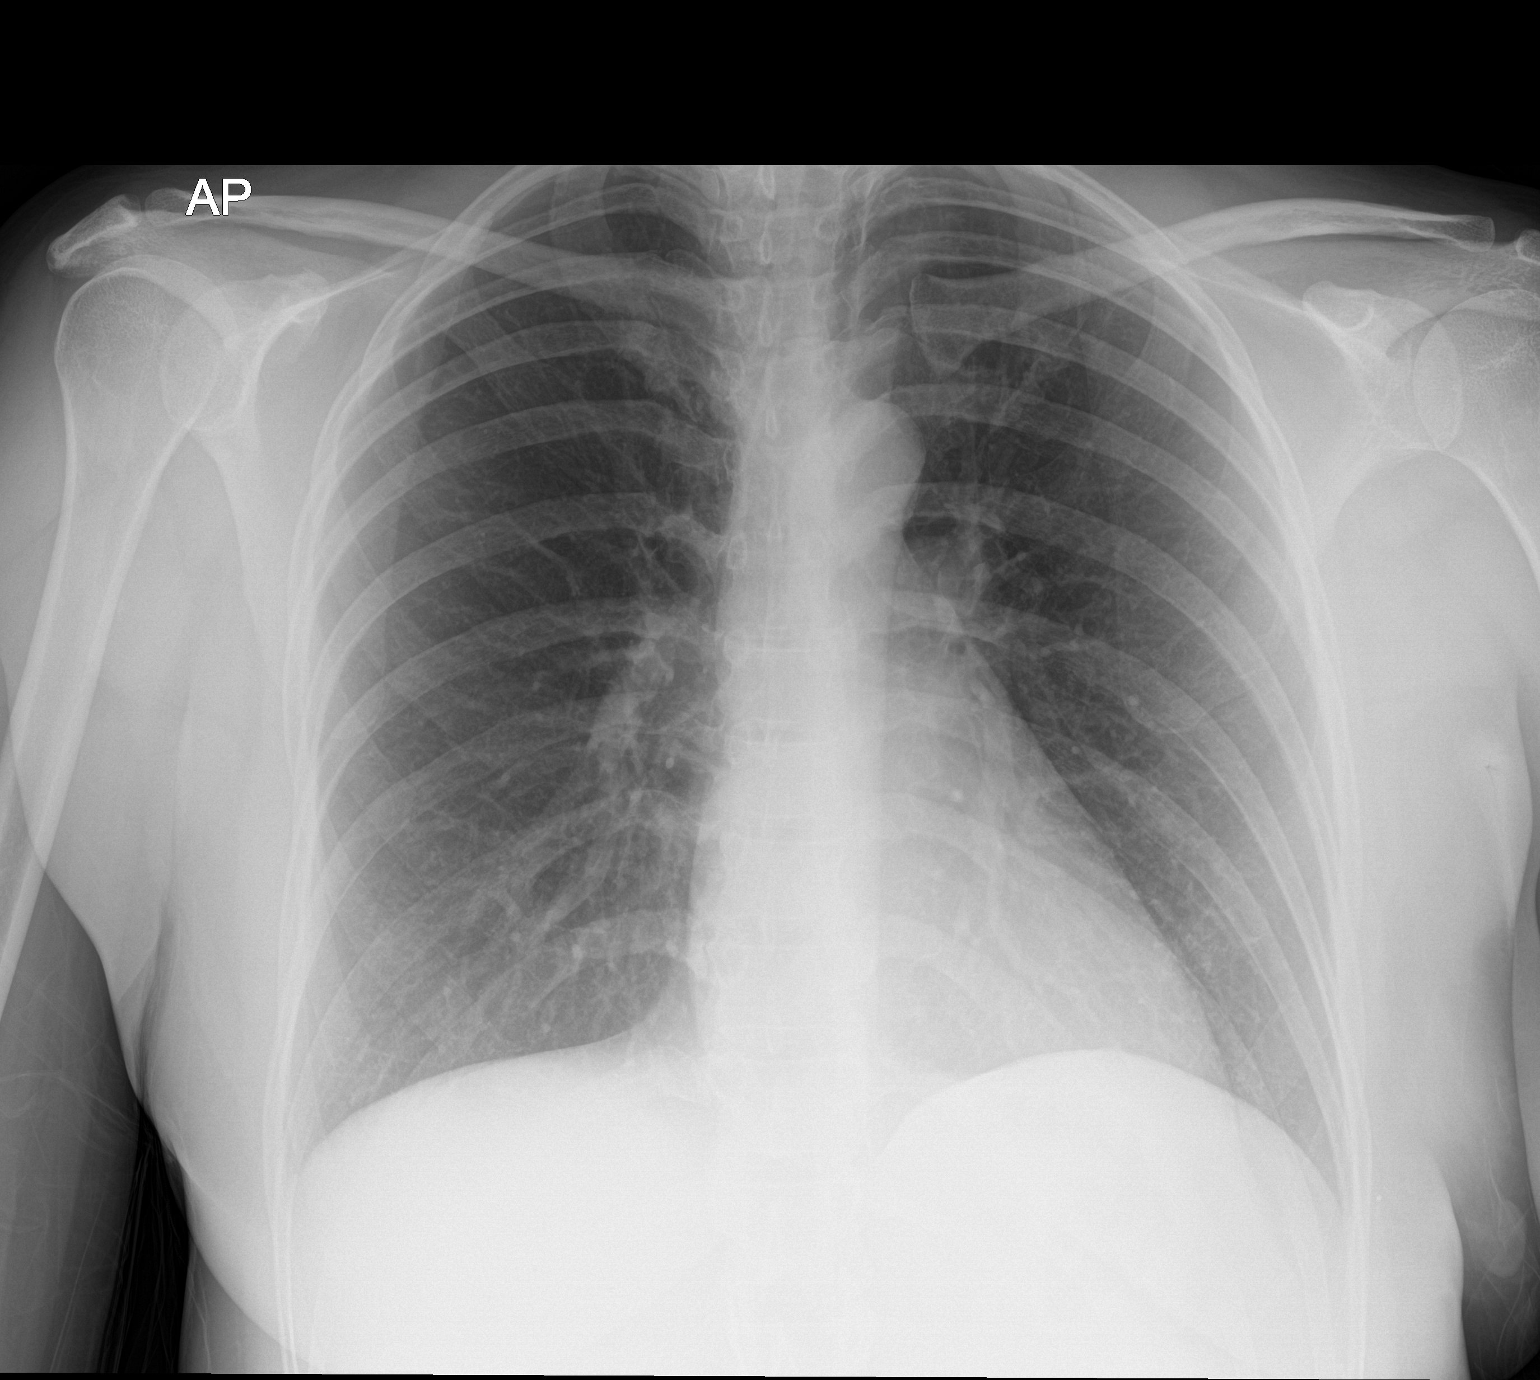

[1 of 1 positions shown; findings below may reference images not displayed]

FINDINGS: The cardiomediastinal contours are normal. The lungs are clear.
Pulmonary vasculature is normal. No consolidation, pleural effusion,
or pneumothorax. No acute osseous abnormalities are seen.
IMPRESSION: Negative portable AP view of the chest.

## 2019-06-06 MED ORDER — PROCHLORPERAZINE MALEATE 10 MG PO TABS: 10.0000 mg | ORAL_TABLET | Freq: Two times a day (BID) | ORAL | 0 refills | Status: DC | PRN

## 2019-06-06 MED ORDER — CYCLOBENZAPRINE HCL 10 MG PO TABS: 10.0000 mg | ORAL_TABLET | Freq: Two times a day (BID) | ORAL | 0 refills | Status: DC | PRN

## 2019-06-06 NOTE — ED Provider Notes (Signed)
[facility] EMERGENCY DEPARTMENT Provider Note   CSN: [id number] Arrival date & time: 06/06/19  1702     History No chief complaint on file.   Michele Cohen is a 39 y.o. female.  HPI     10 days ago diagnosed with COVID Shortness of breath for the last 2 days Before was just with laying down, but kept getting worse Worse with exertion Chest pain, center of chest, tightness in chest with laying down Hx of PE, not on blood thinners any more (had after surgery)  Headache was improving but worse now, nausea, pain right leg in one area Cough didn't start immediately, last Wednesday and persistent Fever none in last 48hr (last was Monday) Lost taste Low eating and drinking  No vaccine  PE was one year ago  Day 14 of symptoms, tried mucinex, cough medicine, tylenol Past Medical History:  Diagnosis Date  . Anemia   . Concussion   . History of benign breast tumor   . Pulmonary embolism (HCC)   . Stress headaches     There are no problems to display for this patient.   Past Surgical History:  Procedure Laterality Date  . ABDOMINAL HYSTERECTOMY       OB History   No obstetric history on file.     No family history on file.  Social History   Tobacco Use  . Smoking status: Never Smoker  . Smokeless tobacco: Never Used  Substance Use Topics  . Alcohol use: No    Comment: occasional  . Drug use: Never    Home Medications Prior to Admission medications   Medication Sig Start Date End Date Taking? Authorizing Provider  ciprofloxacin (CIPRO) 500 MG tablet Take 1 tablet (500 mg total) by mouth 2 (two) times daily. 01/31/19   Vanetta Mulders, MD  cyclobenzaprine (FLEXERIL) 10 MG tablet Take 1 tablet (10 mg total) by mouth 2 (two) times daily as needed for muscle spasms. 06/06/19   Alvira Monday, MD  HYDROcodone-acetaminophen (NORCO/VICODIN) 5-325 MG tablet Take 1 tablet by mouth every 6 (six) hours as needed for moderate pain. 01/31/19    Vanetta Mulders, MD  metroNIDAZOLE (FLAGYL) 500 MG tablet Take 1 tablet (500 mg total) by mouth 3 (three) times daily. 01/31/19   Vanetta Mulders, MD  ondansetron (ZOFRAN ODT) 4 MG disintegrating tablet Take 1 tablet (4 mg total) by mouth every 8 (eight) hours as needed. 01/31/19   Vanetta Mulders, MD  prochlorperazine (COMPAZINE) 10 MG tablet Take 1 tablet (10 mg total) by mouth 2 (two) times daily as needed for nausea or vomiting. 06/06/19   Alvira Monday, MD  sertraline (ZOLOFT) 100 MG tablet Take 100 mg by mouth daily.    [provider]    Allergies    Phenazopyridine, Shellfish allergy, and Zolpidem tartrate  Review of Systems   Review of Systems  Constitutional: Positive for activity change, appetite change, fatigue and fever.  HENT: Positive for congestion.   Respiratory: Positive for cough, chest tightness and shortness of breath.   Cardiovascular: Positive for chest pain. Negative for leg swelling.  Gastrointestinal: Positive for diarrhea and nausea. Negative for abdominal pain and vomiting.  Musculoskeletal: Positive for arthralgias. Negative for back pain.  Skin: Negative for rash.  Neurological: Positive for headaches. Negative for syncope.    Physical Exam Updated Vital Signs BP 121/89   Pulse 86   Temp 98.9 F (37.2 C) (Oral)   Resp 18   Ht 5' 5.5" (1.664 m)   [employer]  68 kg   LMP 12/15/2011   SpO2 100%   BMI 24.57 kg/m   Physical Exam Vitals and nursing note reviewed.  Constitutional:      General: She is not in acute distress.    Appearance: She is well-developed. She is not diaphoretic.  HENT:     Head: Normocephalic and atraumatic.  Eyes:     Conjunctiva/sclera: Conjunctivae normal.  Cardiovascular:     Rate and Rhythm: Normal rate and regular rhythm.     Heart sounds: Normal heart sounds. No murmur. No friction rub. No gallop.   Pulmonary:     Effort: Pulmonary effort is normal. No respiratory distress.     Breath sounds: Normal breath  sounds. No wheezing or rales.  Abdominal:     General: There is no distension.     Palpations: Abdomen is soft.     Tenderness: There is no abdominal tenderness. There is no guarding.  Musculoskeletal:        General: Tenderness (right lower extremity medial lower leg below knee) present.     Cervical back: Normal range of motion.  Skin:    General: Skin is warm and dry.     Findings: No erythema or rash.  Neurological:     Mental Status: She is alert and oriented to person, place, and time.     ED Results / Procedures / Treatments   Labs (all labs ordered are listed, but only abnormal results are displayed) Labs Reviewed  COMPREHENSIVE METABOLIC PANEL - Abnormal; Notable for the following components:      Result Value   Glucose, Bld 116 (*)    All other components within normal limits  CBC WITH DIFFERENTIAL/PLATELET  D-DIMER, QUANTITATIVE (NOT AT [facility])  TROPONIN I (HIGH SENSITIVITY)    EKG EKG Interpretation  Date/Time:  Thursday June 06 2019 19:02:15 EDT Ventricular Rate:  87 PR Interval:    QRS Duration: 74 QT Interval:  347 QTC Calculation: 418 R Axis:   67 Text Interpretation: Sinus rhythm No previous ECGs available Confirmed by Gareth Morgan [phone]) on 06/06/2019 7:52:25 PM Also confirmed by Gareth Morgan [phone]), editor Victory Dakin [phone])  on 06/07/2019 7:51:58 AM   Radiology DG Chest Portable 1 View  Result Date: 06/06/2019 CLINICAL DATA:  Shortness of breath. COVID positive. EXAM: PORTABLE CHEST 1 VIEW COMPARISON:  Chest CTA 04/16/2019, radiograph 06/23/2011 FINDINGS: The cardiomediastinal contours are normal. The lungs are clear. Pulmonary vasculature is normal. No consolidation, pleural effusion, or pneumothorax. No acute osseous abnormalities are seen. IMPRESSION: Negative portable AP view of the chest. Electronically Signed   By: Keith Rake M.D.   On: 06/06/2019 18:55    Procedures Procedures (including critical care time)  Medications  Ordered in ED Medications - No data to display  ED Course  I have reviewed the triage vital signs and the nursing notes.  Pertinent labs & imaging results that were available during my care of the patient were reviewed by me and considered in my medical decision making (see chart for details).    MDM Rules/Calculators/A&P                      39yo female with history of pulmonary embolism and diagnosis of COVID 19 infection 10 days ago presents with concern for shortness of breath and chest tightness.     Differential diagnosis for chest pain includes pulmonary embolus, dissection, pneumothorax, pneumonia, ACS, myocarditis, pericarditis.  EKG was done and evaluate by me and showed no  acute ST changes and no signs of pericarditis. Chest x-ray was done and evaluated by me and radiology and no sign of pneumonia or pneumothorax. She is moderate risk [place] with a negative ddimer, normal oxygenation, HR normalized after arrival and have low suspicion for PE.  Patient is low risk HEART score and had negative troponin after symptoms greater than 6hr and doubt ACS, no sign of myocarditis.  Do not feel history or exam are consistent with aortic dissection.  Symptoms likely secondary to ongoing COVID19 infection. Do not see indication for admission at this time with normal saturations and respiratory rate.  She does have localized pain to area of right lower leg without calf pain, no swelling, and with negative ddimer and overall have low suspicion for DVT--however given history of thromboembolic disease in the past will have her return for DVT study in the AM.   Will give compazine and flexeril rx. Recommend ibuprofen, tylenol, continued supportive care. Discussed reasons to return and recommend return for DVT study in AM.      Final Clinical Impression(s) / ED Diagnoses Final diagnoses:  COVID-19  Shortness of breath    Rx / DC Orders ED Discharge Orders         Ordered    US Venous Img Lower  Unilateral Right     06/06/19 2027    prochlorperazine (COMPAZINE) 10 MG tablet  2 times daily PRN     06/06/19 2055    cyclobenzaprine (FLEXERIL) 10 MG tablet  2 times daily PRN     06/06/19 2055           Alvira Monday, MD 06/07/19 1206

## 2019-06-06 NOTE — ED Notes (Signed)
In to discharge patient, pt states she is unhappy with visit, states she does not feel heard by EDP and needs medications to manage S/S of COVID at home. EDP notified, to call patient and investigate opportunities for service recovery. RN to provide department director's business card.

## 2019-06-06 NOTE — ED Notes (Signed)
Pt describes feeling like she is unable to breathe while lying down, Sat 100%.  Speaking in complete sentences without respiratory distress

## 2019-06-06 NOTE — ED Triage Notes (Signed)
Covid Positive. Here with SOB. She is ambulatory in no distress.

## 2019-06-06 NOTE — ED Notes (Signed)
Verified order for outpatient US DVT study with Dr. Dalene Seltzer. Called [place] to schedule, unable to schedule this study with department at this time. Will provide patient with [place] contact information to call for appointment tomorrow during business hours.

## 2019-06-08 ENCOUNTER — Ambulatory Visit (HOSPITAL_BASED_OUTPATIENT_CLINIC_OR_DEPARTMENT_OTHER): Admission: RE | Admit: 2019-06-08 | Payer: BC Managed Care – PPO | Source: Ambulatory Visit

## 2019-10-23 ENCOUNTER — Encounter: Payer: Self-pay | Admitting: *Deleted

## 2019-10-23 ENCOUNTER — Other Ambulatory Visit: Payer: Self-pay

## 2019-10-23 ENCOUNTER — Emergency Department (INDEPENDENT_AMBULATORY_CARE_PROVIDER_SITE_OTHER)
Admission: EM | Admit: 2019-10-23 | Discharge: 2019-10-23 | Disposition: A | Discharge status: Home / Self Care | Payer: No Typology Code available for payment source | Source: Home / Self Care

## 2019-10-23 ENCOUNTER — Emergency Department: Admit: 2019-10-23 | Discharge status: Absent without leave | Payer: Self-pay

## 2019-10-23 ENCOUNTER — Emergency Department (INDEPENDENT_AMBULATORY_CARE_PROVIDER_SITE_OTHER): Payer: No Typology Code available for payment source

## 2019-10-23 DIAGNOSIS — R1084 Generalized abdominal pain: Secondary | ICD-10-CM

## 2019-10-23 DIAGNOSIS — K5790 Diverticulosis of intestine, part unspecified, without perforation or abscess without bleeding: Secondary | ICD-10-CM

## 2019-10-23 DIAGNOSIS — M5416 Radiculopathy, lumbar region: Secondary | ICD-10-CM

## 2019-10-23 DIAGNOSIS — R140 Abdominal distension (gaseous): Secondary | ICD-10-CM

## 2019-10-23 LAB — POCT URINALYSIS DIP (MANUAL ENTRY)
Bilirubin, UA: NEGATIVE
Blood, UA: NEGATIVE
Glucose, UA: NEGATIVE mg/dL
Leukocytes, UA: NEGATIVE
Nitrite, UA: NEGATIVE
Protein Ur, POC: NEGATIVE mg/dL
Spec Grav, UA: >=1.03 — AB (ref 1.010–1.025)
Urobilinogen, UA: 0.2 E.U./dL
pH, UA: 6 (ref 5.0–8.0)

## 2019-10-23 IMAGING — DX DG ABDOMEN 1V
1 series · 1 of 1 positions shown · non-contrast
Comparison: None.

CLINICAL DATA: Right flank pain

EXAM:
ABDOMEN - 1 VIEW

[abdomen kub]
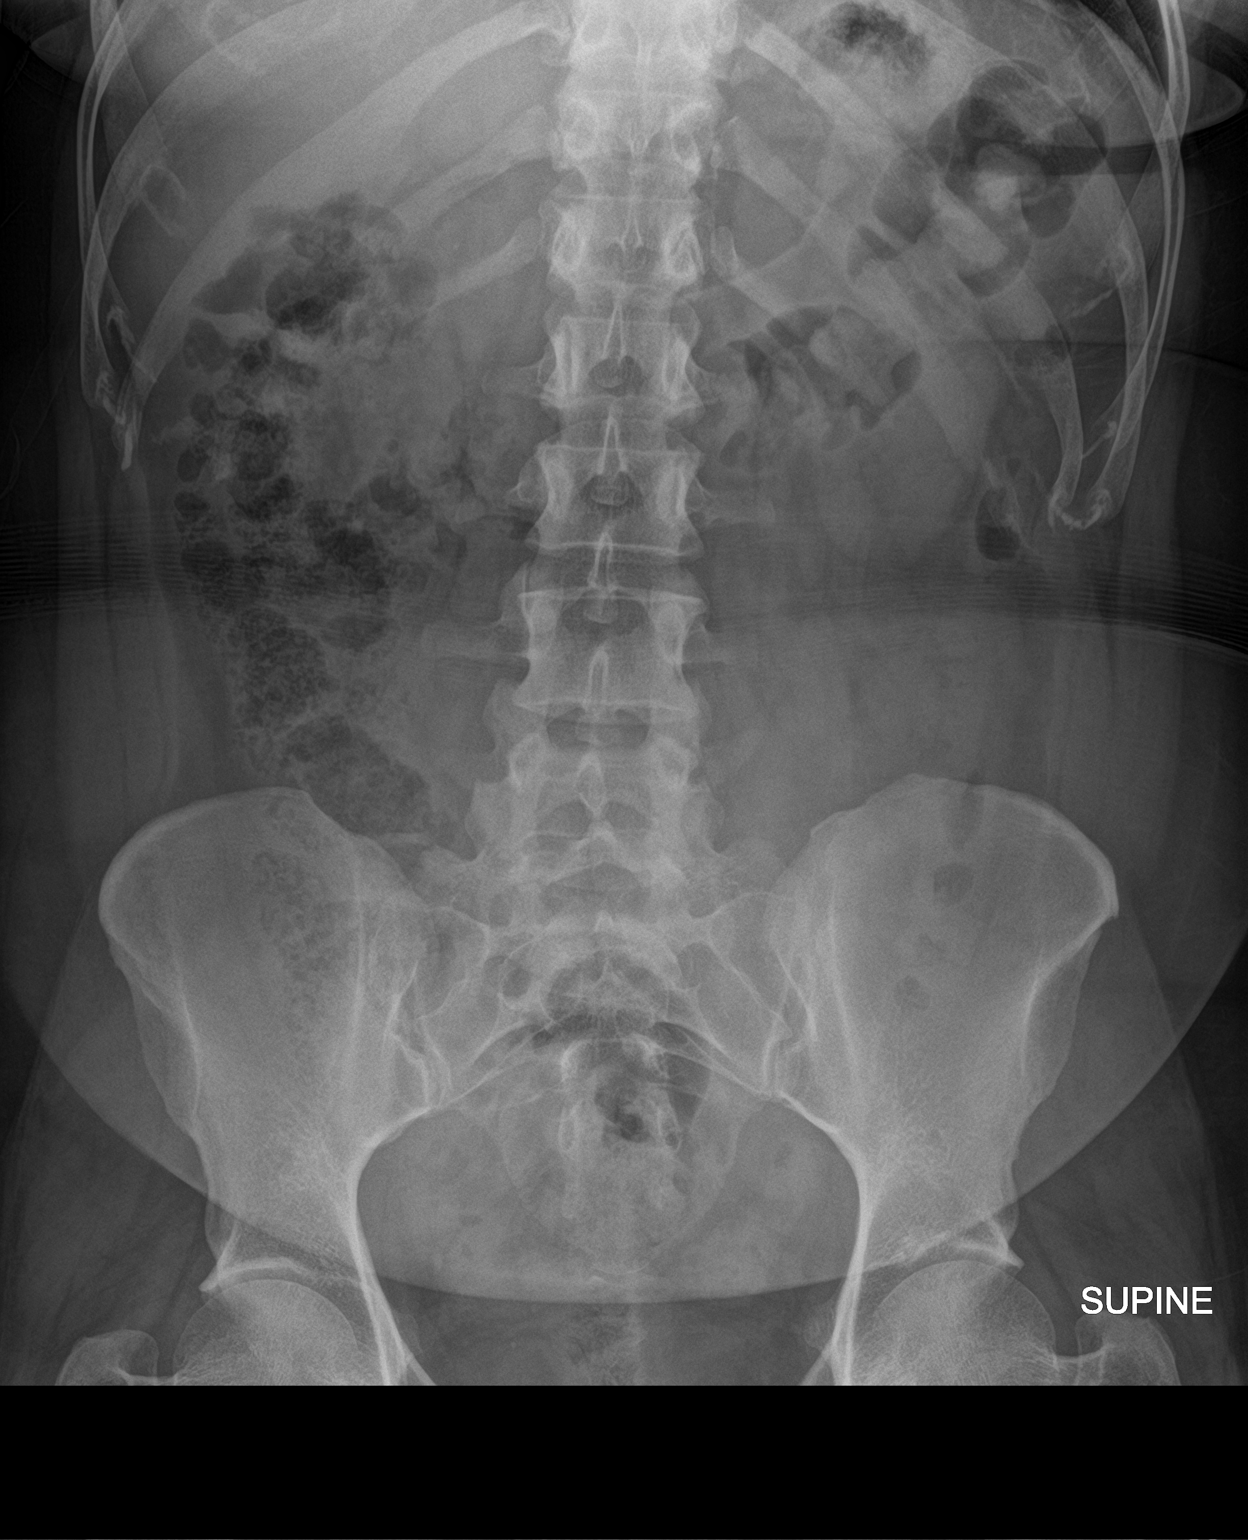

[1 of 1 positions shown; findings below may reference images not displayed]

FINDINGS: The bowel gas pattern is normal. There is a moderate amount of right
colonic stool present. No radio-opaque calculi or other significant
radiographic abnormality are seen.
IMPRESSION: Nonobstructive bowel gas pattern, moderate right colonic stool
present.

## 2019-10-23 MED ORDER — POLYETHYLENE GLYCOL 3350 17 GM/SCOOP PO POWD: 17.0000 g | Freq: Every day | ORAL | 0 refills | Status: DC | PRN

## 2019-10-23 MED ORDER — DOCUSATE SODIUM 100 MG PO CAPS
100.0000 mg | ORAL_CAPSULE | Freq: Every day | ORAL | 0 refills | Status: DC
Start: 2019-10-23 — End: 2020-05-06

## 2019-10-23 MED ORDER — KETOROLAC TROMETHAMINE 60 MG/2ML IM SOLN
60.0000 mg | Freq: Once | INTRAMUSCULAR | Status: AC
  Administered 2019-10-23: 60 mg via INTRAMUSCULAR

## 2019-10-23 NOTE — ED Triage Notes (Addendum)
Patient c/o pelvic/trunk cramping, low back pain and hip pain x 1 1/2-2 weeks. Afebrile. Reports urinary frequency and yellow. Possible kidney stone in 01/2019. Last BM this AM. Reports change in stool shape and color to darker. H/o diverticulitis.

## 2019-10-23 NOTE — Discharge Instructions (Signed)
Your x-ray shows a large amount of stool in you right colon. Negative for renal stones. Recommend bowel cleanse with Miralax and colace stool softener. Referral placed for Gastroenterology, there office will contact you to schedule an appointment    Hydrate with at least 3-4 16.9 ounce bottles of water daily to resolve dehydration.

## 2019-10-23 NOTE — ED Provider Notes (Signed)
[facility]    CSN: [id number] Arrival date & time: 10/23/19  1616      History   Chief Complaint Chief Complaint  Patient presents with  . Back Pain    low  . Pelvic Pain    HPI Michele Cohen is a 39 y.o. female.   HPI  Patient with a history of chronic constipation related to IBS presents today for evaluation of of abdominal cramping, abdominal bloating, and pressure. Pt is new to area , originally from [place] and followed by [facility] in [place].  Endorses associated cramping. Reports regular BM, although the texture (thicker) and quantity of stool has changed. Denies dark tarry stools or severe abdominal pain. Endorses localized RLQ and R L flank pain. No dysuria.   Past Medical History:  Diagnosis Date  . Anemia   . Concussion   . History of benign breast tumor   . Pulmonary embolism (HCC)   . Stress headaches     There are no problems to display for this patient.   Past Surgical History:  Procedure Laterality Date  . ABDOMINAL HYSTERECTOMY      OB History   No obstetric history on file.      Home Medications    Prior to Admission medications   Medication Sig Start Date End Date Taking? Authorizing Provider  cyclobenzaprine (FLEXERIL) 10 MG tablet Take 1 tablet (10 mg total) by mouth 2 (two) times daily as needed for muscle spasms. 06/06/19   Alvira Monday, MD  ondansetron (ZOFRAN ODT) 4 MG disintegrating tablet Take 1 tablet (4 mg total) by mouth every 8 (eight) hours as needed. 01/31/19   Vanetta Mulders, MD  prochlorperazine (COMPAZINE) 10 MG tablet Take 1 tablet (10 mg total) by mouth 2 (two) times daily as needed for nausea or vomiting. 06/06/19   Alvira Monday, MD  sertraline (ZOLOFT) 100 MG tablet Take 100 mg by mouth daily.    [provider]    Family History History reviewed. No pertinent family history.  Social History Social History   Tobacco Use  . Smoking status: Never Smoker  . Smokeless tobacco:  Never Used  Vaping Use  . Vaping Use: Never used  Substance Use Topics  . Alcohol use: No    Comment: occasional  . Drug use: Never     Allergies   Phenazopyridine, Shellfish allergy, and Zolpidem tartrate   Review of Systems Review of Systems Pertinent negatives listed in HPI  Physical Exam Triage Vital Signs ED Triage Vitals  Enc Vitals Group     BP 10/23/19 1704 120/87     Pulse Rate 10/23/19 1704 (!) 104     Resp 10/23/19 1704 14     Temp 10/23/19 1704 98.8 F (37.1 C)     Temp Source 10/23/19 1704 Oral     SpO2 10/23/19 1704 99 %     Weight --      Height --      Head Circumference --      Peak Flow --      Pain Score 10/23/19 1708 8     Pain Loc --      Pain Edu? --      Excl. in GC? --    No data found.  Updated Vital Signs BP 120/87 (BP Location: Right Arm)   Pulse (!) 104   Temp 98.8 F (37.1 C) (Oral)   Resp 14   LMP 12/15/2011   SpO2 99%   Visual Acuity Right Eye  Distance:   Left Eye Distance:   Bilateral Distance:    Right Eye Near:   Left Eye Near:    Bilateral Near:     Physical Exam General appearance: alert, well developed, well nourished, cooperative and in no distress Head: Normocephalic, without obvious abnormality, atraumatic Respiratory: Respirations even and unlabored, normal respiratory rate Heart: rate and rhythm normal. No gallop or murmurs noted on exam  Abdomen: BS +, distention +, no rebound tenderness, or no mass Extremities: No gross deformities Skin: Skin color, texture, turgor normal. No rashes seen  Psych: Appropriate ood and affect.  UC Treatments / Results  Labs (all labs ordered are listed, but only abnormal results are displayed) Labs Reviewed  POCT URINALYSIS DIP (MANUAL ENTRY) - Abnormal; Notable for the following components:      Result Value   Ketones, POC UA trace (5) (*)    Spec Grav, UA >=1.030 (*)    All other components within normal limits    EKG   Radiology DG Abdomen 1 View  Result  Date: 10/23/2019 CLINICAL DATA:  Right flank pain EXAM: ABDOMEN - 1 VIEW COMPARISON:  None. FINDINGS: The bowel gas pattern is normal. There is a moderate amount of right colonic stool present. No radio-opaque calculi or other significant radiographic abnormality are seen. IMPRESSION: Nonobstructive bowel gas pattern, moderate right colonic stool present. Electronically Signed   By: Jonna Clark M.D.   On: 10/23/2019 19:26    Procedures Procedures (including critical care time)  Medications Ordered in UC Medications - No data to display  Initial Impression / Assessment and Plan / UC Course  I have reviewed the triage vital signs and the nursing notes.  Pertinent labs & imaging results that were available during my care of the patient were reviewed by me and considered in my medical decision making (see chart for details).     Patient exam today is reassuring. Negative peritoneal signs. Suspect this is related to chronic diverticular disease and acid reflux.  See medications orders below. Ensure proper follow-up with PCP. See orders per.  Referral placed to [facility] for specialty management. Final Clinical Impressions(s) / UC Diagnoses   Final diagnoses:  Diverticular disease  Lumbar radiculopathy  Abdominal bloating     Discharge Instructions     Your x-ray shows a large amount of stool in you right colon. Negative for renal stones. Recommend bowel cleanse with Miralax and colace stool softener. Referral placed for Gastroenterology, there office will contact you to schedule an appointment    Hydrate with at least 3-4 16.9 ounce bottles of water daily to resolve dehydration.    ED Prescriptions    Medication Sig Dispense Auth. Provider   docusate sodium (COLACE) 100 MG capsule Take 1 capsule (100 mg total) by mouth at bedtime. 60 capsule Bing Neighbors, FNP   polyethylene glycol powder (MIRALAX) 17 GM/SCOOP powder Take 17 g by mouth daily as needed for moderate  constipation. 255 g Bing Neighbors, FNP     PDMP not reviewed this encounter.   Bing Neighbors, FNP 10/29/19 1705

## 2019-10-25 LAB — URINE CULTURE
MICRO NUMBER:: 10957541
SPECIMEN QUALITY:: ADEQUATE

## 2019-12-23 ENCOUNTER — Ambulatory Visit: Payer: No Typology Code available for payment source | Admitting: Gastroenterology

## 2020-03-10 ENCOUNTER — Encounter: Payer: Self-pay | Admitting: Gastroenterology

## 2020-03-10 ENCOUNTER — Ambulatory Visit (INDEPENDENT_AMBULATORY_CARE_PROVIDER_SITE_OTHER): Payer: No Typology Code available for payment source | Admitting: Gastroenterology

## 2020-03-10 VITALS — BP 89/70 | HR 98 | Ht 62.5 in | Wt 154.0 lb

## 2020-03-10 DIAGNOSIS — R1084 Generalized abdominal pain: Secondary | ICD-10-CM | POA: Diagnosis not present

## 2020-03-10 DIAGNOSIS — R140 Abdominal distension (gaseous): Secondary | ICD-10-CM | POA: Diagnosis not present

## 2020-03-10 DIAGNOSIS — R110 Nausea: Secondary | ICD-10-CM | POA: Diagnosis not present

## 2020-03-10 DIAGNOSIS — R194 Change in bowel habit: Secondary | ICD-10-CM | POA: Diagnosis not present

## 2020-03-10 MED ORDER — PLENVU 140 G PO SOLR
140.0000 g | ORAL | 0 refills | Status: DC
Start: 2020-03-10 — End: 2020-04-14

## 2020-03-10 NOTE — Patient Instructions (Signed)
If you are age 42 or older, your body mass index should be between 23-30. Your Body mass index is 27.72 kg/m. If this is out of the aforementioned range listed, please consider follow up with your Primary Care Provider.  If you are age 78 or younger, your body mass index should be between 19-25. Your Body mass index is 27.72 kg/m. If this is out of the aformentioned range listed, please consider follow up with your Primary Care Provider.   You have been scheduled for an endoscopy and colonoscopy. Please follow the written instructions given to you at your visit today. Please pick up your prep supplies at the pharmacy within the next 1-3 days. If you use inhalers (even only as needed), please bring them with you on the day of your procedure.  It was a pleasure to see you today!  Dr. Myrtie Neither

## 2020-03-10 NOTE — Progress Notes (Addendum)
[facility] Consult Note:  History: Michele Cohen 03/10/2020  Referring provider: Pcp, No  Reason for consult/chief complaint: Abdominal Pain (Upper and lower, onset 2 months), Nausea (Regulary, using zofran), Bloated, change in Bowel habits (Noticed about 5 to 6 months, colonoscopy in [place] 2 yrs ago - diverticulosis, benign polyps), Fatigue, and Weight Loss (Loss of appetite, last 2 to 3 weeks)   Subjective  HPI:  This is a very pleasant 40 year old woman here to establish care with a new GI provider for multiple chronic digestive symptoms.  Limited records from prior GI practice are available, she did have the report from her last CT scan and colonoscopy accessed through the [facility] EMR on her phone today. In about 2014 she developed abdominal pain with bloating and episodic diarrhea.  She underwent a colonoscopy in June 2020 that found diverticulosis and a normal terminal ileum.  Biopsies from the TI and colon were also normal. She was having worsening symptoms along with frequent nausea last year, and Dr. Anna Genre in [place] reportedly tested for SIBO, and she recalls being treated with antibiotics.  When I mentioned rifaximin, she thought that was probably what she had received. She has severe nausea for many months, says Zofran is of little help so she only occasionally takes it.  She seems to get more relief from using a "motion bracelet". She has abdominal bloating a globus sensation, abdominal pain and chronic constipation.  She might have some occasional blood on the paper if she is struggling with a bowel movement. CT scan 11/13/2019 revealed colonic diverticulosis, a small nonobstructing right renal stone and a small benign-appearing right ovarian cyst.  Her GI practice recommended she see her gynecologist, which she did and was told there was no gynecologic cause of her symptoms. She is also concerned because over the last several months there has  been a change in the character of bowel movements.  She still has a BM typically once per day, but they are smaller and are sometimes flat, pebbles or ribbons.  ____________________________________  From GI visit in [place] 11/13/19:  "[name],  It was nice to meet you at our virtual appointment today. I am sorry it was under better circumstances. I have ordered your CT scan and blood work. You can go anytime to have the blood work drawn and you should be receiving a call from radiology soon to schedule the CT scan. I am attaching a copy of the recommendations we discussed at the time of our visit. Please feel free to contact me with any questions or concerns.  Plan: 1. STAT CT A/P with and without contrast to rule out diverticulitis versus renal stone. Assess for suspicious intra-abdominal pathology. 2. Labs: CBC, CMP, amylase and lipase to assess liver, renal and pancreatic function and to rule out evidence of infection and anemia. 3. Trial dicyclomine 20 mg twice daily for abdominal discomfort/cramping. 4. Follow a bland, low residue/low fiber diet until further notice. 5. Be sure to stay well-hydrated with at least 64 ounces of fluids per day. 6. Additional recommendations pending results. 7. Additional plan of care recommendations pending any test results and symptom re-evaluation. 8. Schedule an office follow up 1 month. Call or message the office sooner for new/worsening symptoms or for any questions/concerns.  Sincerely,  Santina Evans L. Manson Passey, MHS, HWC, PA-C [facility]"    ROS:  Review of Systems  Constitutional: Negative for appetite change and unexpected weight change.  HENT: Negative for mouth sores and voice change.  Eyes: Negative for pain and redness.  Respiratory: Negative for cough and shortness of breath.   Cardiovascular: Negative for chest pain and palpitations.  Genitourinary: Negative for dysuria and hematuria.  Musculoskeletal: Positive for back  pain. Negative for arthralgias and myalgias.  Skin: Negative for pallor and rash.  Neurological: Negative for weakness and headaches.  Hematological: Negative for adenopathy.  Psychiatric/Behavioral:       Anxiety     Past Medical History: Past Medical History:  Diagnosis Date  . Anemia   . Concussion   . Diverticulosis   . Headache disorder   . History of benign breast tumor   . Lumbar radiculopathy   . Pulmonary embolism (HCC) 2019  . Renal cyst   . Stress headaches      Past Surgical History: Past Surgical History:  Procedure Laterality Date  . ABDOMINAL HYSTERECTOMY    . BREAST CYST EXCISION    . fallopian tube      removal      Family History: Family History  Problem Relation Age of Onset  . Breast cancer Mother   . Heart attack Mother   . Diabetes Mother   . Hypertension Mother   . Heart failure Father   . Diabetes Father   . Hypertension Father   . Pancreatic cancer Maternal Grandfather   . Colon cancer Paternal Grandfather 15       70s ?    Social History: Social History   Socioeconomic History  . Marital status: Married    Spouse name: Not on file  . Number of children: Not on file  . Years of education: Not on file  . Highest education level: Not on file  Occupational History  . Not on file  Tobacco Use  . Smoking status: Never Smoker  . Smokeless tobacco: Never Used  Vaping Use  . Vaping Use: Never used  Substance and Sexual Activity  . Alcohol use: No    Comment: occasional  . Drug use: Never  . Sexual activity: Yes    Birth control/protection: None  Other Topics Concern  . Not on file  Social History Narrative  . Not on file   Social Determinants of Health   Financial Resource Strain: Not on file  Food Insecurity: Not on file  Transportation Needs: Not on file  Physical Activity: Not on file  Stress: Not on file  Social Connections: Not on file    Allergies: Allergies  Allergen Reactions  . Phenazopyridine  Anaphylaxis    itching  . Shellfish Allergy Anaphylaxis  . Zolpidem Tartrate Itching    Outpatient Meds: Current Outpatient Medications  Medication Sig Dispense Refill  . docusate sodium (COLACE) 100 MG capsule Take 1 capsule (100 mg total) by mouth at bedtime. (Patient taking differently: Take 100 mg by mouth as needed.) 60 capsule 0  . ondansetron (ZOFRAN ODT) 4 MG disintegrating tablet Take 1 tablet (4 mg total) by mouth every 8 (eight) hours as needed. 12 tablet 1  . PEG-KCl-NaCl-NaSulf-Na Asc-C (PLENVU) 140 g SOLR Take 140 g by mouth as directed. Manufacturer's coupon Universal coupon code:BIN: G6837245; GROUP: [id number]; PCN: [id number]; ID: [id number]; PAY NO MORE $50; NO prior authorization 1 each 0  . polyethylene glycol powder (MIRALAX) 17 GM/SCOOP powder Take 17 g by mouth daily as needed for moderate constipation. 255 g 0   No current facility-administered medications for this visit.      ___________________________________________________________________ Objective   Exam:  BP (!) 89/70   Pulse 98  Ht 5' 2.5" (1.588 m)   Wt 154 lb (69.9 kg)   LMP 12/15/2011   SpO2 99%   BMI 27.72 kg/m  Wt Readings from Last 3 Encounters:  03/10/20 154 lb (69.9 kg)  06/06/19 149 lb 14.6 oz (68 kg)  04/16/19 150 lb (68 kg)  (Weight stable over last year)   General: Well-appearing, normal vocal quality  Eyes: sclera anicteric, no redness  ENT: oral mucosa moist without lesions, no cervical or supraclavicular lymphadenopathy  CV: RRR without murmur, S1/S2, no JVD, no peripheral edema  Resp: clear to auscultation bilaterally, normal RR and effort noted  GI: soft, mild mid to lower tenderness, with active bowel sounds. No guarding or palpable organomegaly noted.  Skin; warm and dry, no rash or jaundice noted  Neuro: awake, alert and oriented x 3. Normal gross motor function and fluent speech  Labs:  nml CBC with GYN 01/14/20  Radiologic Studies:  See  above  Assessment: Encounter Diagnoses  Name Primary?  . Generalized abdominal pain Yes  . Abdominal bloating   . Nausea in adult   . Change in bowel habits     Multiple chronic digestive symptoms, many elements of which suggest a functional bowel disorder.  She apparently had a positive test for SIBO, and after receiving antibiotics she feels that the occasional episodes of diarrhea improved, but not much else.  It does not sound like she has had anorectal manometry. Persistent nausea suggesting upper digestive condition, although perhaps ancillary symptoms of IBS.  No prior upper endoscopy.  She says it was discussed with previous physician, but they had not gotten to doing it.  Plan:  Upper endoscopy and colonoscopy.  She was agreeable after discussion of procedure and risks.  The benefits and risks of the planned procedure were described in detail with the patient or (when appropriate) their health care proxy.  Risks were outlined as including, but not limited to, bleeding, infection, perforation, adverse medication reaction leading to cardiac or pulmonary decompensation, pancreatitis (if ERCP).  The limitation of incomplete mucosal visualization was also discussed.  No guarantees or warranties were given.  Depending on findings, possible repeat of SIBO breath testing afterward. Consider anorectal manometry.  If ultimately most consistent with IBS, we can discuss a trial of tegaserod or prucalopride. Would have to start cautiously with the dosing.  She does have a bowel movement daily, but it sounds like she gets incompletely evacuated.  Thank you for the courtesy of this consult.  Please call me with any questions or concerns.  Charlie Pitter III  CC: Referring provider noted above

## 2020-03-29 ENCOUNTER — Emergency Department (HOSPITAL_BASED_OUTPATIENT_CLINIC_OR_DEPARTMENT_OTHER): Payer: No Typology Code available for payment source

## 2020-03-29 ENCOUNTER — Emergency Department (HOSPITAL_BASED_OUTPATIENT_CLINIC_OR_DEPARTMENT_OTHER)
Admission: EM | Admit: 2020-03-29 | Discharge: 2020-03-29 | Disposition: A | Discharge status: Home / Self Care | Payer: No Typology Code available for payment source | Attending: Emergency Medicine | Admitting: Emergency Medicine

## 2020-03-29 ENCOUNTER — Other Ambulatory Visit: Payer: Self-pay

## 2020-03-29 ENCOUNTER — Encounter (HOSPITAL_BASED_OUTPATIENT_CLINIC_OR_DEPARTMENT_OTHER): Payer: Self-pay | Admitting: Emergency Medicine

## 2020-03-29 DIAGNOSIS — R0789 Other chest pain: Secondary | ICD-10-CM | POA: Insufficient documentation

## 2020-03-29 DIAGNOSIS — R0602 Shortness of breath: Secondary | ICD-10-CM | POA: Diagnosis present

## 2020-03-29 DIAGNOSIS — M546 Pain in thoracic spine: Secondary | ICD-10-CM | POA: Diagnosis not present

## 2020-03-29 DIAGNOSIS — R791 Abnormal coagulation profile: Secondary | ICD-10-CM | POA: Insufficient documentation

## 2020-03-29 DIAGNOSIS — R072 Precordial pain: Secondary | ICD-10-CM

## 2020-03-29 LAB — CBC WITH DIFFERENTIAL/PLATELET
Abs Immature Granulocytes: 0.03 10*3/uL (ref 0.00–0.07)
Basophils Absolute: 0 10*3/uL (ref 0.0–0.1)
Basophils Relative: 1 %
Eosinophils Absolute: 0.1 10*3/uL (ref 0.0–0.5)
Eosinophils Relative: 2 %
HCT: 39.6 % (ref 36.0–46.0)
Hemoglobin: 13.1 g/dL (ref 12.0–15.0)
Immature Granulocytes: 1 %
Lymphocytes Relative: 27 %
Lymphs Abs: 1.5 10*3/uL (ref 0.7–4.0)
MCH: 29.2 pg (ref 26.0–34.0)
MCHC: 33.1 g/dL (ref 30.0–36.0)
MCV: 88.2 fL (ref 80.0–100.0)
Monocytes Absolute: 0.3 10*3/uL (ref 0.1–1.0)
Monocytes Relative: 6 %
Neutro Abs: 3.5 10*3/uL (ref 1.7–7.7)
Neutrophils Relative %: 63 %
Platelets: 229 10*3/uL (ref 150–400)
RBC: 4.49 MIL/uL (ref 3.87–5.11)
RDW: 12.5 % (ref 11.5–15.5)
WBC: 5.4 10*3/uL (ref 4.0–10.5)
nRBC: 0 % (ref 0.0–0.2)

## 2020-03-29 LAB — COMPREHENSIVE METABOLIC PANEL
ALT: 16 U/L (ref 0–44)
AST: 16 U/L (ref 15–41)
Albumin: 3.8 g/dL (ref 3.5–5.0)
Alkaline Phosphatase: 52 U/L (ref 38–126)
Anion gap: 9 (ref 5–15)
BUN: 13 mg/dL (ref 6–20)
CO2: 23 mmol/L (ref 22–32)
Calcium: 8.9 mg/dL (ref 8.9–10.3)
Chloride: 104 mmol/L (ref 98–111)
Creatinine, Ser: 0.89 mg/dL (ref 0.44–1.00)
GFR, Estimated: >60 mL/min (ref >60)
Glucose, Bld: 94 mg/dL (ref 70–99)
Potassium: 3.8 mmol/L (ref 3.5–5.1)
Sodium: 136 mmol/L (ref 135–145)
Total Bilirubin: 0.2 mg/dL — ABNORMAL LOW (ref 0.3–1.2)
Total Protein: 7 g/dL (ref 6.5–8.1)

## 2020-03-29 LAB — D-DIMER, QUANTITATIVE: D-Dimer, Quant: 0.67 ug/mL-FEU — ABNORMAL HIGH (ref 0.00–0.50)

## 2020-03-29 LAB — TROPONIN I (HIGH SENSITIVITY): Troponin I (High Sensitivity): <2 ng/L (ref <18)

## 2020-03-29 IMAGING — DX DG CHEST 1V PORT
1 series · 1 of 1 positions shown · non-contrast
Comparison: [DATE]

CLINICAL DATA: Recurrence chest tightness x2 weeks.

EXAM:
PORTABLE CHEST 1 VIEW

[chest ap]
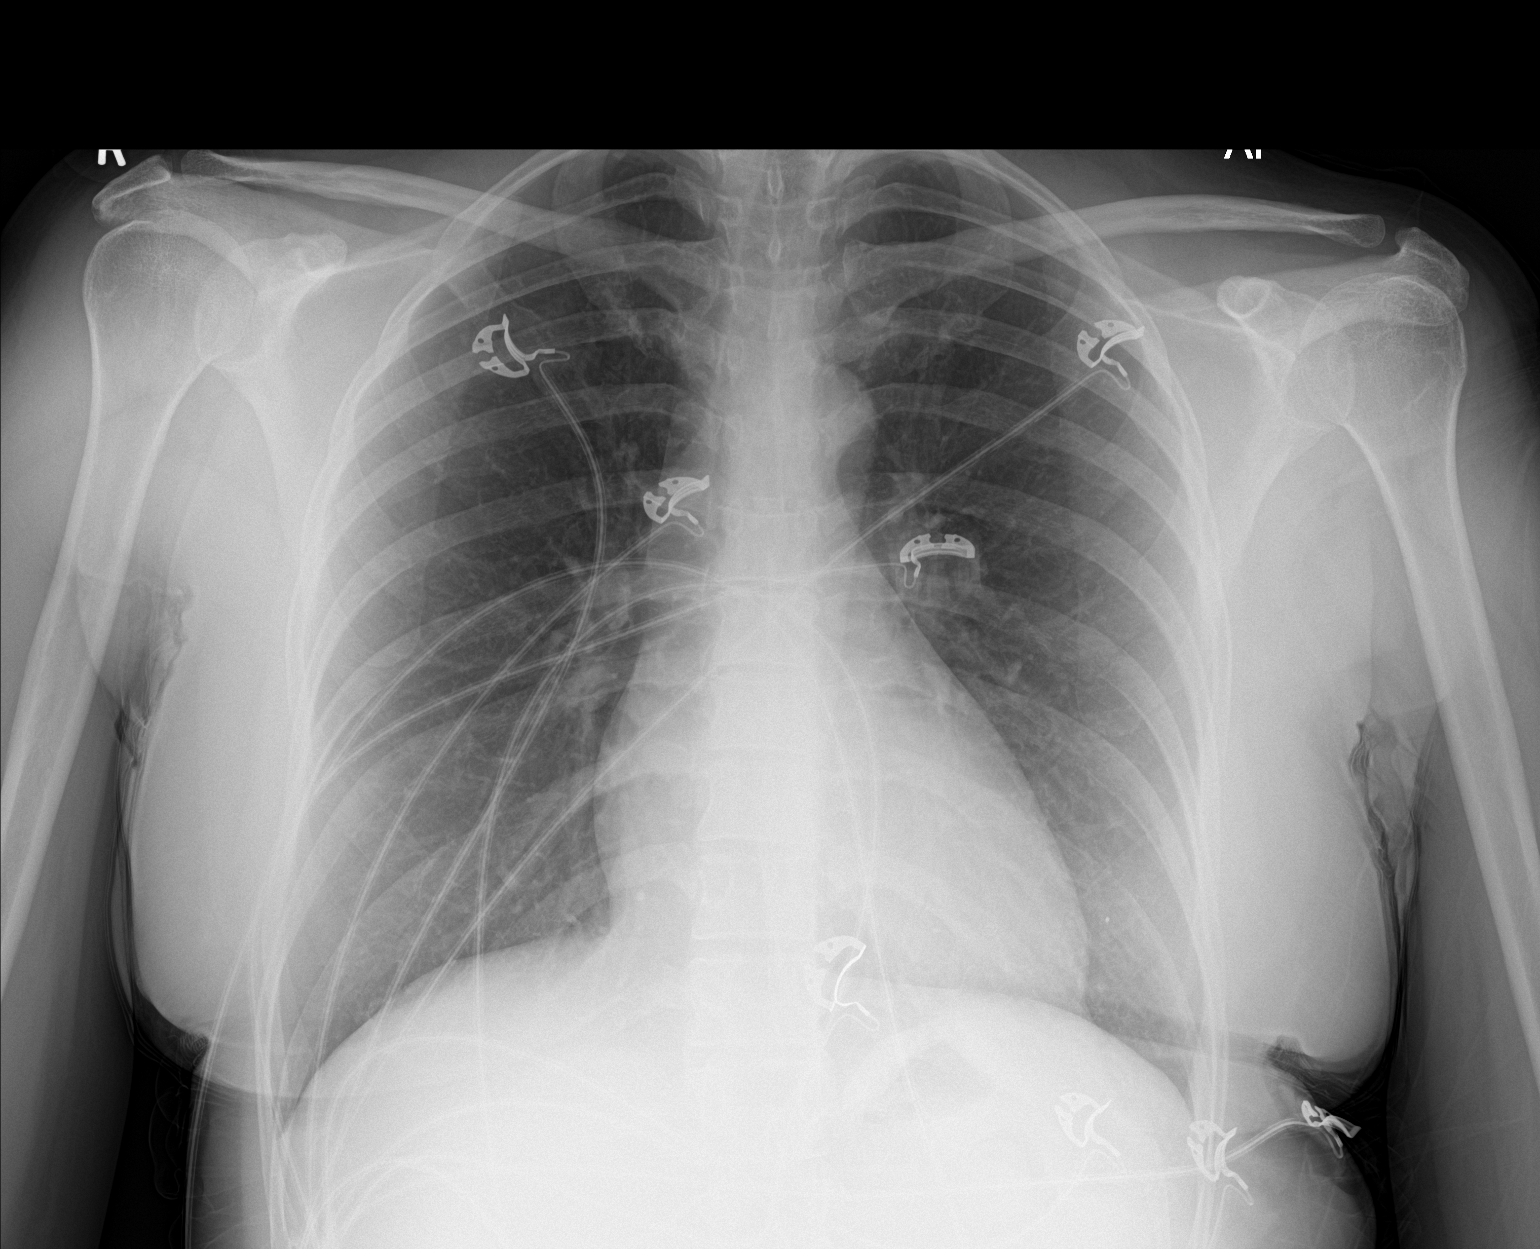

[1 of 1 positions shown; findings below may reference images not displayed]

FINDINGS: The heart size and mediastinal contours are within normal limits.
Both lungs are clear. The visualized skeletal structures are
unremarkable.
IMPRESSION: No active disease.

## 2020-03-29 IMAGING — CT CT ANGIO CHEST
2 of 8 series · 19 of 36 positions shown · IV contrast (Omnipaque)
Comparison: [DATE]

CLINICAL DATA: Chest tightness x2 weeks.

EXAM:
CT ANGIOGRAPHY CHEST WITH CONTRAST
TECHNIQUE: Multidetector CT imaging of the chest was performed using the
standard protocol during bolus administration of intravenous
contrast. Multiplanar CT image reconstructions and MIPs were
obtained to evaluate the vascular anatomy.
CONTRAST:  100mL OMNIPAQUE IOHEXOL 350 MG/ML SOLN

[Series 6: pe coronal mpr · coronal · 0.54mm/px · 1 of 116 slices shown]
[im 58/116  mediastinal]
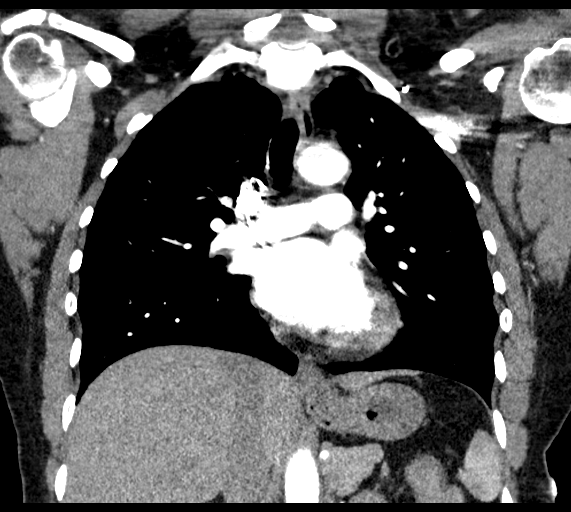

[Series 10: pe thins · axial · 0.63mm/px · z∈[+1124,+1362]mm · 18 of 269 slices shown]
[im 15/269  lung]
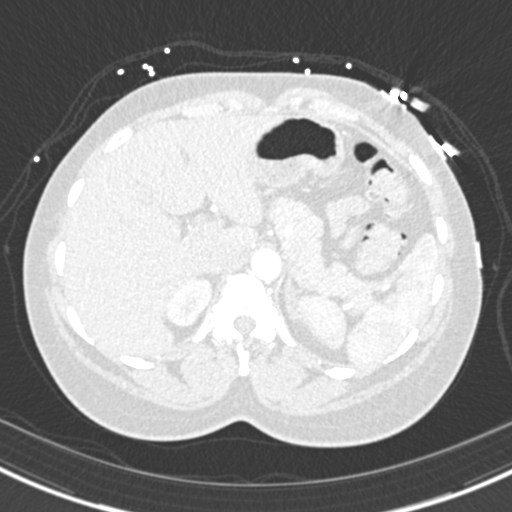
[im 29/269  mediastinal]
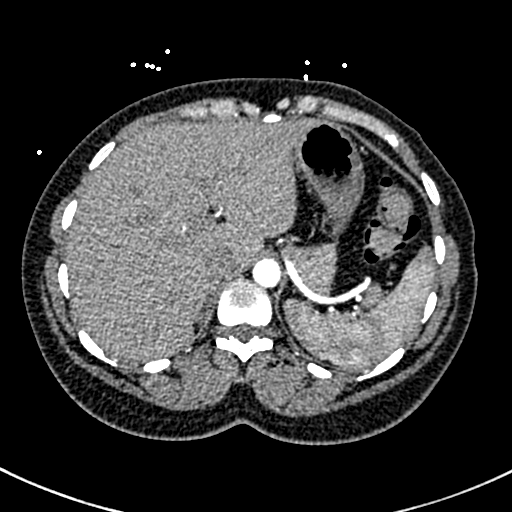
[im 43/269  lung]
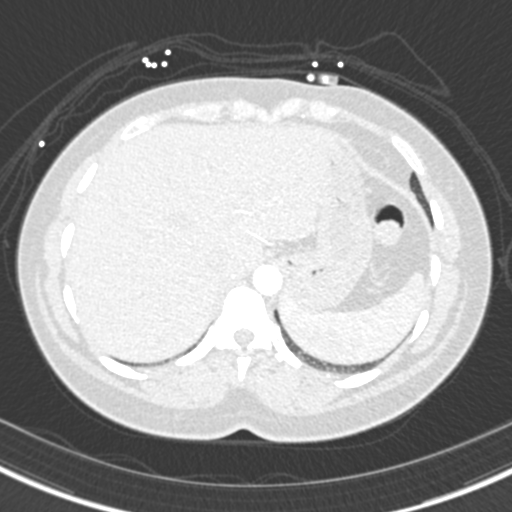
[im 57/269  mediastinal]
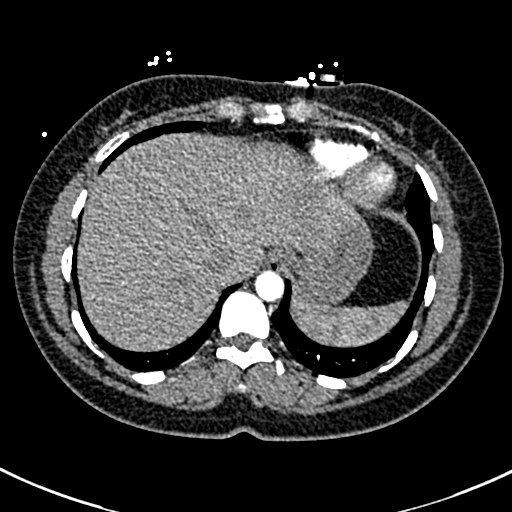
[im 71/269  lung]
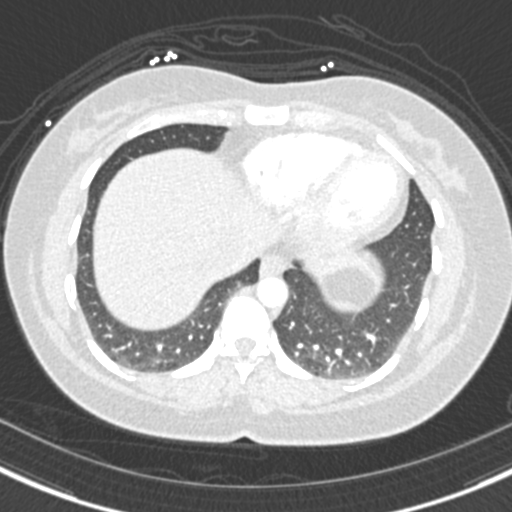
[im 85/269  mediastinal]
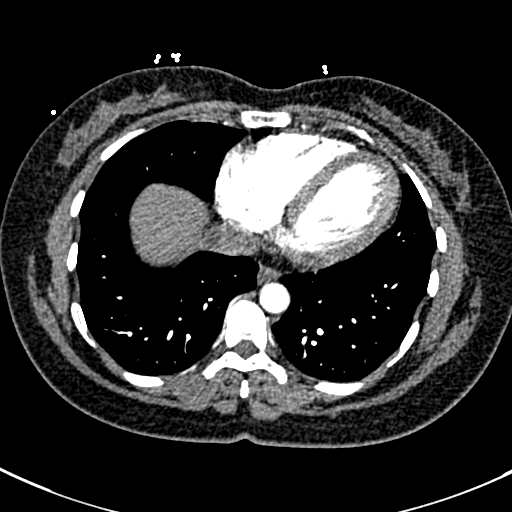
[im 99/269  lung]
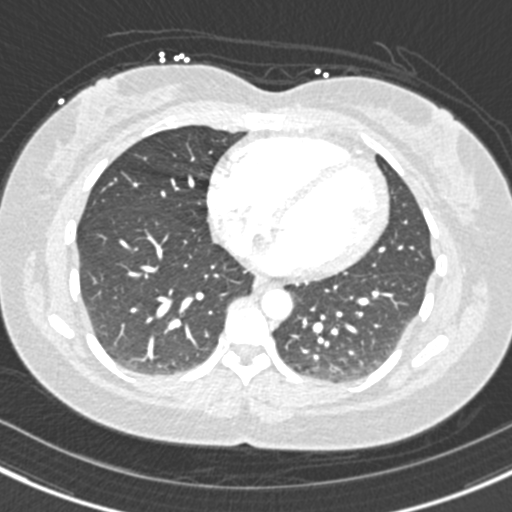
[im 113/269  mediastinal]
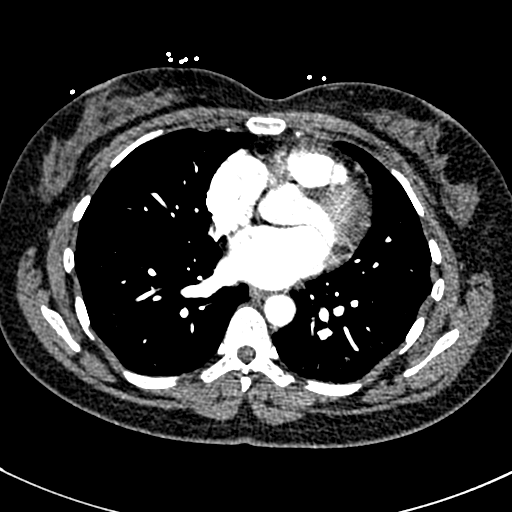
[im 127/269  lung]
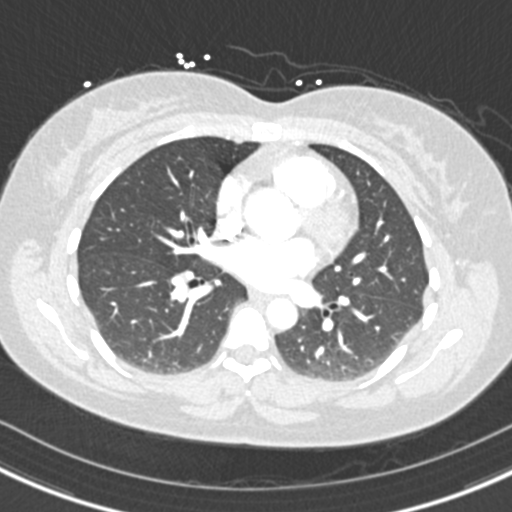
[im 142/269  mediastinal]
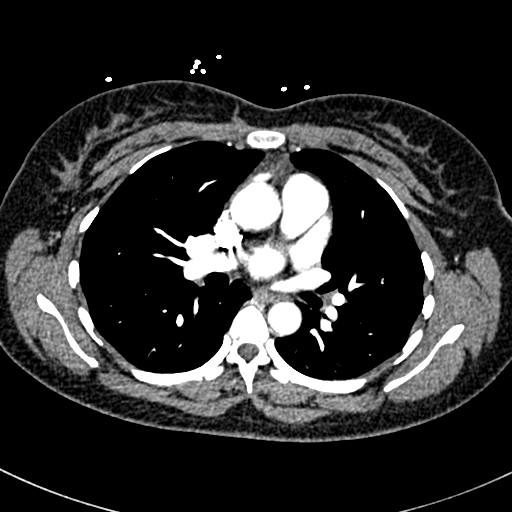
[im 156/269  lung]
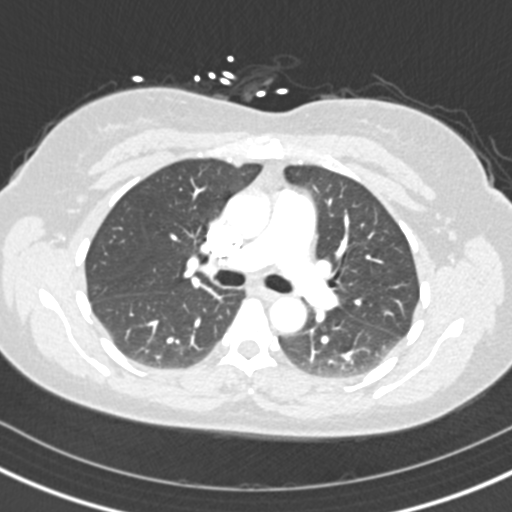
[im 170/269  mediastinal]
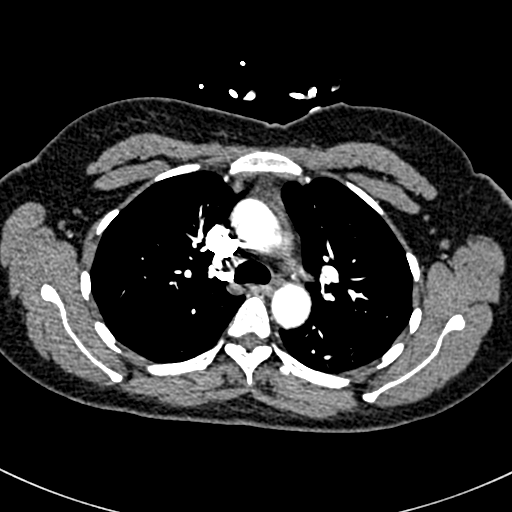
[im 184/269  lung]
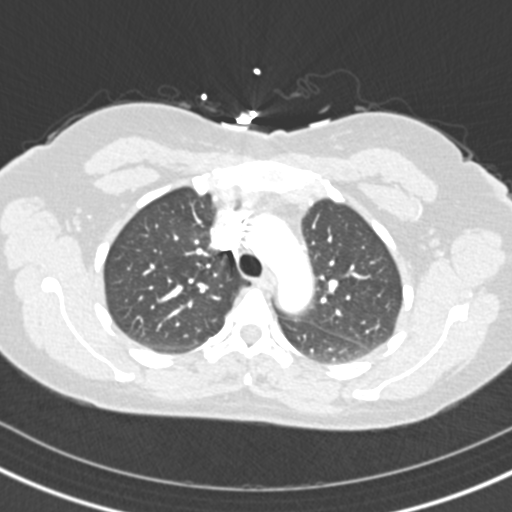
[im 198/269  mediastinal]
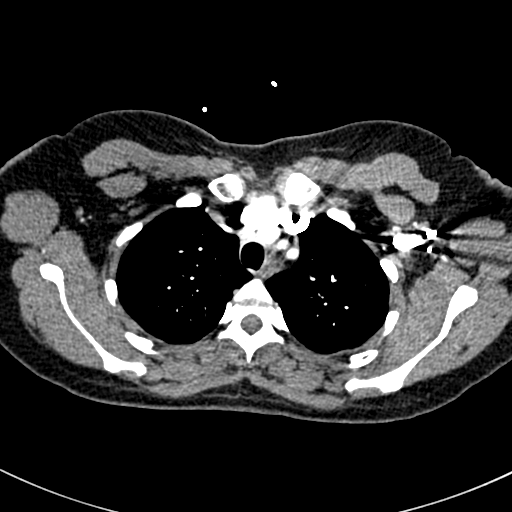
[im 212/269  lung]
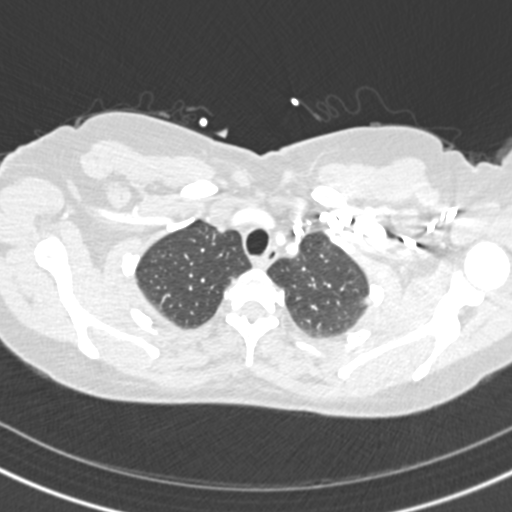
[im 226/269  mediastinal]
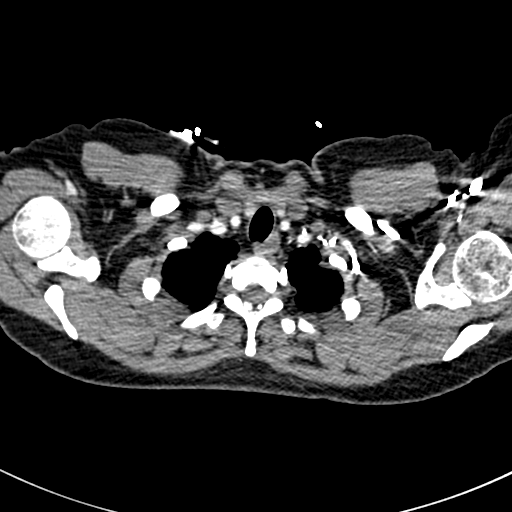
[im 240/269  lung]
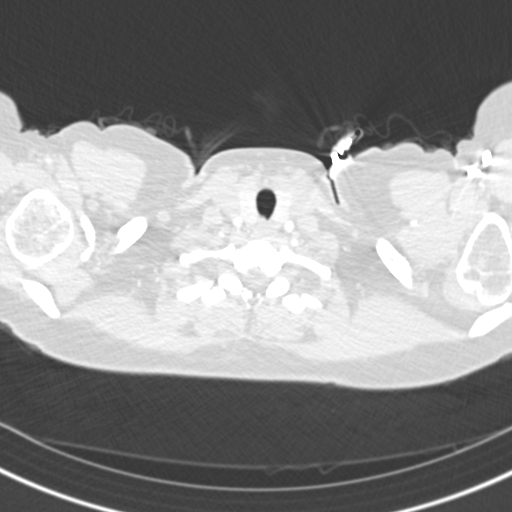
[im 254/269  mediastinal]
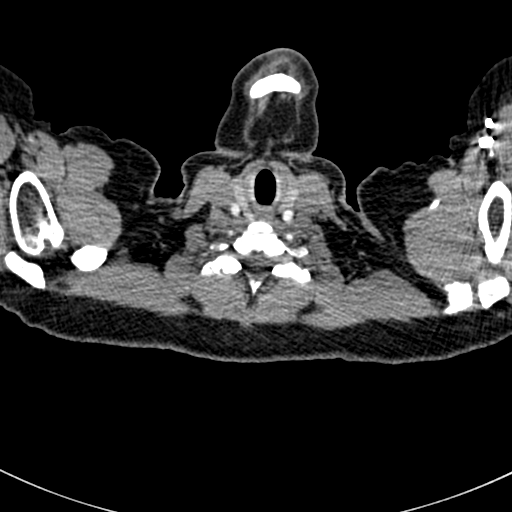

[19 of 36 positions shown; findings below may reference images not displayed]

FINDINGS: Cardiovascular: Satisfactory opacification of the pulmonary arteries
to the segmental level. No evidence of pulmonary embolism. Normal
heart size. No pericardial effusion.

Mediastinum/Nodes: No enlarged mediastinal, hilar, or axillary lymph
nodes. Thyroid gland, trachea, and esophagus demonstrate no
significant findings.

Lungs/Pleura: Lungs are clear. No pleural effusion or pneumothorax.

Upper Abdomen: No acute abnormality.

Musculoskeletal: No chest wall abnormality. No acute or significant
osseous findings.

Review of the MIP images confirms the above findings.
IMPRESSION: Negative examination for pulmonary embolism.

## 2020-03-29 MED ORDER — IOHEXOL 350 MG/ML SOLN
100.0000 mL | Freq: Once | INTRAVENOUS | Status: AC | PRN
  Administered 2020-03-29: 100 mL via INTRAVENOUS

## 2020-03-29 NOTE — ED Provider Notes (Signed)
[facility] EMERGENCY DEPARTMENT Provider Note   CSN: [id number] Arrival date & time: 03/29/20  2135     History Chief Complaint  Patient presents with  . Shortness of Breath    Michele Cohen is a 40 y.o. female.  Patient is a 40 year old female with a history of anemia, PE after surgery on anticoagulation for 8 months but has been off for over a year, septic thrombophlebitis and status post hysterectomy presenting today with complaints of chest pain and shortness of breath.  Patient reports that she started noticing symptoms about 2 weeks ago when she would be active she would be a little more short of breath than normal but then on Monday took her son to a college campus store and was short of breath with just walking around.  She has had intermittent chest tightness and pain but then starting yesterday started having more pain in her left upper back.  The pain is worse with taking deep breaths.  She has been clearing her throat but denies any cough or congestion.  She has had no fever.  She does report that recently she has been sitting in her bed with her legs crossed because she has been working from home and sits there for hours at a time.  2 weeks ago she did have some pain in her right leg and calf but that has resolved now.  She denies feeling shortness of breath at this moment but with walking she does feel like she has to breathe faster.  She has no history of tobacco use, OCPs or asthma.  No known heart history.  The history is provided by the patient.  Shortness of Breath Severity:  Moderate Onset quality:  Gradual      Past Medical History:  Diagnosis Date  . Anemia   . Concussion   . Diverticulosis   . Headache disorder   . History of benign breast tumor   . Lumbar radiculopathy   . Pulmonary embolism (HCC) 2019  . Renal cyst   . Stress headaches     Patient Active Problem List   Diagnosis Date Noted  . 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  BRBPR (bright red blood per rectum) 03/13/2017  . Dysuria 03/13/2017  . Headache 03/13/2017  . Pulmonary embolus (HCC) 03/13/2017  . S/P hysterectomy 03/13/2017  . 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 Shortness of breath 03/13/2017  . Vaginitis 03/13/2017  . Breast lump 07/20/2015  . Dense breasts 07/20/2015  . Family history of breast cancer 07/20/2015    Past Surgical History:  Procedure Laterality Date  . ABDOMINAL HYSTERECTOMY    . BREAST CYST EXCISION    . fallopian tube      removal      OB History   No obstetric history on file.     Family History  Problem Relation Age of Onset  . Breast cancer Mother   . Heart attack Mother   . Diabetes Mother   . Hypertension Mother   . Heart failure Father   . Diabetes Father   . Hypertension Father   . Pancreatic cancer Maternal Grandfather   . Colon cancer Paternal Grandfather 45       70s ?    Social History   Tobacco Use  . Smoking status: Never Smoker  . Smokeless tobacco: Never Used  Vaping Use  . Vaping Use: Never used  Substance Use Topics  . Alcohol use: No    Comment: occasional  . Drug  use: Never    Home Medications Prior to Admission medications   Medication Sig Start Date End Date Taking? Authorizing Provider  docusate sodium (COLACE) 100 MG capsule Take 1 capsule (100 mg total) by mouth at bedtime. Patient taking differently: Take 100 mg by mouth as needed. 10/23/19   Bing Neighbors, FNP  ondansetron (ZOFRAN ODT) 4 MG disintegrating tablet Take 1 tablet (4 mg total) by mouth every 8 (eight) hours as needed. 01/31/19   Vanetta Mulders, MD  PEG-KCl-NaCl-NaSulf-Na Asc-C (PLENVU) 140 g SOLR Take 140 g by mouth as directed. Manufacturer's coupon Universal coupon code:BIN: G6837245; GROUP: [id number]; PCN: [id number]; ID: [id number]; PAY NO MORE $50; NO prior authorization 03/10/20   Charlie Pitter III, MD  polyethylene glycol powder (MIRALAX) 17 GM/SCOOP powder Take 17 g by mouth daily as needed for moderate  constipation. 10/23/19   Bing Neighbors, FNP    Allergies    Phenazopyridine, Shellfish allergy, and Zolpidem tartrate  Review of Systems   Review of Systems  Respiratory: Positive for shortness of breath.   Gastrointestinal:       For several months now she has had worsening GI symptoms with cramping, nausea.  She is seeing gastroenterology and is planning for colonoscopy and an endoscopy soon.  She takes no medicine except for Zofran for this.  All other systems reviewed and are negative.   Physical Exam Updated Vital Signs BP (!) 143/83 (BP Location: Right Arm)   Pulse 83   Temp 98.6 F (37 C) (Oral)   Resp 19   Ht 5' 2.5" (1.588 m)   Wt 69.9 kg   LMP 12/15/2011   SpO2 97%   BMI 27.74 kg/m   Physical Exam Vitals and nursing note reviewed.  Constitutional:      General: She is not in acute distress.    Appearance: She is well-developed and well-nourished.  HENT:     Head: Normocephalic and atraumatic.  Eyes:     Extraocular Movements: EOM normal.     Pupils: Pupils are equal, round, and reactive to light.  Cardiovascular:     Rate and Rhythm: Normal rate and regular rhythm.     Pulses: Normal pulses and intact distal pulses.     Heart sounds: Normal heart sounds. No murmur heard. No friction rub.  Pulmonary:     Effort: Pulmonary effort is normal.     Breath sounds: Normal breath sounds. No wheezing or rales.  Chest:     Chest wall: No tenderness.  Abdominal:     General: Bowel sounds are normal. There is no distension.     Palpations: Abdomen is soft.     Tenderness: There is no abdominal tenderness. There is no guarding or rebound.  Musculoskeletal:        General: No tenderness. Normal range of motion.     Right lower leg: No edema.     Left lower leg: No edema.     Comments: No edema  Skin:    General: Skin is warm and dry.     Findings: No rash.  Neurological:     General: No focal deficit present.     Mental Status: She is alert and oriented to  person, place, and time. Mental status is at baseline.     Cranial Nerves: No cranial nerve deficit.  Psychiatric:        Mood and Affect: Mood and affect and mood normal.        Behavior: Behavior normal.  Thought Content: Thought content normal.     ED Results / Procedures / Treatments   Labs (all labs ordered are listed, but only abnormal results are displayed) Labs Reviewed  COMPREHENSIVE METABOLIC PANEL - Abnormal; Notable for the following components:      Result Value   Total Bilirubin 0.2 (*)    All other components within normal limits  D-DIMER, QUANTITATIVE - Abnormal; Notable for the following components:   D-Dimer, Quant 0.67 (*)    All other components within normal limits  CBC WITH DIFFERENTIAL/PLATELET  TROPONIN I (HIGH SENSITIVITY)    EKG EKG Interpretation  Date/Time:  Sunday March 29 2020 21:50:22 EST Ventricular Rate:  82 PR Interval:    QRS Duration: 81 QT Interval:  364 QTC Calculation: 426 R Axis:   69 Text Interpretation: Sinus rhythm No significant change since last tracing Confirmed by Gwyneth Sprout ([id number]) on 03/29/2020 9:53:20 PM   Radiology DG Chest Port 1 View  Result Date: 03/29/2020 CLINICAL DATA:  Recurrence chest tightness x2 weeks. EXAM: PORTABLE CHEST 1 VIEW COMPARISON:  June 06, 2019 FINDINGS: The heart size and mediastinal contours are within normal limits. Both lungs are clear. The visualized skeletal structures are unremarkable. IMPRESSION: No active disease. Electronically Signed   By: Aram Candela M.D.   On: 03/29/2020 22:35    Procedures Procedures   Medications Ordered in ED Medications - No data to display  ED Course  I have reviewed the triage vital signs and the nursing notes.  Pertinent labs & imaging results that were available during my care of the patient were reviewed by me and considered in my medical decision making (see chart for details).    MDM Rules/Calculators/A&P                           Pleasant 40 year old female presenting today with complaint of chest pain, thoracic back pain and shortness of breath.  This has been gradually worsening over 2 weeks.  More significant today.  Patient's oxygen saturations are normal on room air without tachycardia and stable blood pressure.  Patient does have prior history of PE and has been off anticoagulation for approximately a year and a half.  This was after a surgery and she does not take OCPs or use tobacco products.  However given complaints and symptoms concern for PE and will do a D-dimer.  Patient's EKG is within normal limits and low suspicion that this is ACS today.  She reports the pain today has been pretty constant and we will do a troponin to ensure no evidence of heart strain or atypical ACS.  Also patient symptoms could be related to a GI source.  She has been having GI issues for several months now and is planning an endoscopy and colonoscopy soon this could be reflux esophagitis.  Labs and chest x-ray are pending.  10:46 PM CBC, CMP and CXR wnl.  D-dimer mildly elevated and trop pending.  Will get CTA to further evaluate.  Final Clinical Impression(s) / ED Diagnoses Final diagnoses:  None    Rx / DC Orders ED Discharge Orders    None       Gwyneth Sprout, MD 03/29/20 2247

## 2020-03-29 NOTE — ED Triage Notes (Signed)
Reports having chest tightness on and off for the last two weeks.  On Monday felt weak and chest felt tight once again.  Now reports tightness continues and now having pain in left mid back.  Also endorses feeling SOB.  Hx of PE.

## 2020-04-10 ENCOUNTER — Encounter: Payer: Self-pay | Admitting: Gastroenterology

## 2020-04-14 ENCOUNTER — Encounter: Payer: Self-pay | Admitting: Gastroenterology

## 2020-04-14 ENCOUNTER — Other Ambulatory Visit: Payer: Self-pay

## 2020-04-14 ENCOUNTER — Ambulatory Visit (AMBULATORY_SURGERY_CENTER): Payer: No Typology Code available for payment source | Admitting: Gastroenterology

## 2020-04-14 VITALS — BP 111/81 | HR 82 | Temp 98.0°F | Resp 16 | Ht 62.0 in | Wt 154.0 lb

## 2020-04-14 DIAGNOSIS — R140 Abdominal distension (gaseous): Secondary | ICD-10-CM | POA: Diagnosis not present

## 2020-04-14 DIAGNOSIS — K2950 Unspecified chronic gastritis without bleeding: Secondary | ICD-10-CM | POA: Diagnosis not present

## 2020-04-14 DIAGNOSIS — K2970 Gastritis, unspecified, without bleeding: Secondary | ICD-10-CM | POA: Diagnosis not present

## 2020-04-14 DIAGNOSIS — R110 Nausea: Secondary | ICD-10-CM

## 2020-04-14 DIAGNOSIS — K5730 Diverticulosis of large intestine without perforation or abscess without bleeding: Secondary | ICD-10-CM | POA: Diagnosis not present

## 2020-04-14 DIAGNOSIS — R1084 Generalized abdominal pain: Secondary | ICD-10-CM | POA: Diagnosis present

## 2020-04-14 DIAGNOSIS — R194 Change in bowel habit: Secondary | ICD-10-CM

## 2020-04-14 HISTORY — PX: COLONOSCOPY: SHX174

## 2020-04-14 HISTORY — PX: UPPER GASTROINTESTINAL ENDOSCOPY: SHX188

## 2020-04-14 MED ORDER — SODIUM CHLORIDE 0.9 % IV SOLN
500.0000 mL | Freq: Once | INTRAVENOUS | Status: DC
Start: 2020-04-14 — End: 2020-04-14

## 2020-04-14 NOTE — Progress Notes (Signed)
To [facility], VSS. Report to [email or web address] 

## 2020-04-14 NOTE — Progress Notes (Signed)
Pt's states no medical or surgical changes since previsit or office visit.  ° °Vitals CW °

## 2020-04-14 NOTE — Op Note (Signed)
[facility] Patient Name: Michele Cohen Procedure Date: 04/14/2020 3:19 PM MRN: [id number] Endoscopist: Sherilyn Cooter L. Myrtie Cohen , MD Age: 40 Referring MD:  Date of Birth: 06-11-1980 Gender: Female Account #: [id number] Procedure:                Colonoscopy Indications:              Lower abdominal pain, Constipation Medicines:                Monitored Anesthesia Care Procedure:                Pre-Anesthesia Assessment:                           - Prior to the procedure, a History and Physical                            was performed, and patient medications and                            allergies were reviewed. The patient's tolerance of                            previous anesthesia was also reviewed. The risks                            and benefits of the procedure and the sedation                            options and risks were discussed with the patient.                            All questions were answered, and informed consent                            was obtained. Prior Anticoagulants: The patient has                            taken no previous anticoagulant or antiplatelet                            agents. ASA Grade Assessment: II - A patient with                            mild systemic disease. After reviewing the risks                            and benefits, the patient was deemed in                            satisfactory condition to undergo the procedure.                           After obtaining informed consent, the colonoscope  was passed under direct vision. Throughout the                            procedure, the patient's blood pressure, pulse, and                            oxygen saturations were monitored continuously. The                            Colonoscope was introduced through the anus and                            advanced to the the terminal ileum, with                            identification of the  appendiceal orifice and IC                            valve. The colonoscopy was performed without                            difficulty. The patient tolerated the procedure                            well. The quality of the bowel preparation was                            good. The terminal ileum, ileocecal valve,                            appendiceal orifice, and rectum were photographed. Scope In: 3:31:48 PM Scope Out: 3:42:21 PM Scope Withdrawal Time: 0 hours 7 minutes 37 seconds  Total Procedure Duration: 0 hours 10 minutes 33 seconds  Findings:                 The perianal and digital rectal examinations were                            normal.                           The terminal ileum appeared normal.                           Multiple diverticula were found in the left colon                            (with associated tortuosity) and right colon.                           The exam was otherwise without abnormality on                            direct and retroflexion views. Complications:            No immediate  complications. Estimated Blood Loss:     Estimated blood loss: none. Impression:               - The examined portion of the ileum was normal.                           - Diverticulosis in the left colon and in the right                            colon.                           - The examination was otherwise normal on direct                            and retroflexion views.                           - No specimens collected. Recommendation:           - Patient has a contact number available for                            emergencies. The signs and symptoms of potential                            delayed complications were discussed with the                            patient. Return to normal activities tomorrow.                            Written discharge instructions were provided to the                            patient.                           - Resume  previous diet.                           - Continue present medications.                           - Repeat colonoscopy in 10 years for screening                            purposes.                           - See the other procedure note for documentation of                            additional recommendations.                           - Breath testing to evaluate for SIBO.                           -  Anorectal manometry Michele Pritchard L. Myrtie Neither, MD 04/14/2020 3:54:36 PM This report has been signed electronically.

## 2020-04-14 NOTE — Progress Notes (Signed)
Called to room to assist during endoscopic procedure.  Patient ID and intended procedure confirmed with present staff. Received instructions for my participation in the procedure from the performing physician.  

## 2020-04-14 NOTE — Patient Instructions (Signed)
Information on diverticulosis given to you today.  Await pathology results.  Resume previous diet and medications.  Repeat colonoscopy in 10 years.  Follow up to be scheduled.  YOU HAD AN ENDOSCOPIC PROCEDURE TODAY AT THE [facility]:   Refer to the procedure report that was given to you for any specific questions about what was found during the examination.  If the procedure report does not answer your questions, please call your gastroenterologist to clarify.  If you requested that your care partner not be given the details of your procedure findings, then the procedure report has been included in a sealed envelope for you to review at your convenience later.  YOU SHOULD EXPECT: Some feelings of bloating in the abdomen. Passage of more gas than usual.  Walking can help get rid of the air that was put into your GI tract during the procedure and reduce the bloating. If you had a lower endoscopy (such as a colonoscopy or flexible sigmoidoscopy) you may notice spotting of blood in your stool or on the toilet paper. If you underwent a bowel prep for your procedure, you may not have a normal bowel movement for a few days.  Please Note:  You might notice some irritation and congestion in your nose or some drainage.  This is from the oxygen used during your procedure.  There is no need for concern and it should clear up in a day or so.  SYMPTOMS TO REPORT IMMEDIATELY:   Following lower endoscopy (colonoscopy or flexible sigmoidoscopy):  Excessive amounts of blood in the stool  Significant tenderness or worsening of abdominal pains  Swelling of the abdomen that is new, acute  Fever of 100F or higher   Following upper endoscopy (EGD)  Vomiting of blood or coffee ground material  New chest pain or pain under the shoulder blades  Painful or persistently difficult swallowing  New shortness of breath  Fever of 100F or higher  Black, tarry-looking stools  For urgent or emergent  issues, a gastroenterologist can be reached at any hour by calling [phone]. Do not use MyChart messaging for urgent concerns.    DIET:  We do recommend a small meal at first, but then you may proceed to your regular diet.  Drink plenty of fluids but you should avoid alcoholic beverages for 24 hours.  ACTIVITY:  You should plan to take it easy for the rest of today and you should NOT DRIVE or use heavy machinery until tomorrow (because of the sedation medicines used during the test).    FOLLOW UP: Our staff will call the number listed on your records 48-72 hours following your procedure to check on you and address any questions or concerns that you may have regarding the information given to you following your procedure. If we do not reach you, we will leave a message.  We will attempt to reach you two times.  During this call, we will ask if you have developed any symptoms of COVID 19. If you develop any symptoms (ie: fever, flu-like symptoms, shortness of breath, cough etc.) before then, please call [phone].  If you test positive for Covid 19 in the 2 weeks post procedure, please call and report this information to [place].    If any biopsies were taken you will be contacted by phone or by letter within the next 1-3 weeks.  Please call us at [phone] if you have not heard about the biopsies in 3 weeks.    SIGNATURES/CONFIDENTIALITY:  You and/or your care partner have signed paperwork which will be entered into your electronic medical record.  These signatures attest to the fact that that the information above on your After Visit Summary has been reviewed and is understood.  Full responsibility of the confidentiality of this discharge information lies with you and/or your care-partner.

## 2020-04-14 NOTE — Op Note (Signed)
[facility] Patient Name: Michele Cohen Procedure Date: 04/14/2020 3:18 PM MRN: [id number] Endoscopist: Michele Cohen , MD Age: 40 Referring MD:  Date of Birth: 1980-08-30 Gender: Female Account #: [id number] Procedure:                Upper GI endoscopy Indications:              Nausea Medicines:                Monitored Anesthesia Care Procedure:                Pre-Anesthesia Assessment:                           - Prior to the procedure, a History and Physical                            was performed, and patient medications and                            allergies were reviewed. The patient's tolerance of                            previous anesthesia was also reviewed. The risks                            and benefits of the procedure and the sedation                            options and risks were discussed with the patient.                            All questions were answered, and informed consent                            was obtained. Prior Anticoagulants: The patient has                            taken no previous anticoagulant or antiplatelet                            agents. ASA Grade Assessment: II - A patient with                            mild systemic disease. After reviewing the risks                            and benefits, the patient was deemed in                            satisfactory condition to undergo the procedure.                           After obtaining informed consent, the endoscope was  passed under direct vision. Throughout the                            procedure, the patient's blood pressure, pulse, and                            oxygen saturations were monitored continuously. The                            Endoscope was introduced through the mouth, and                            advanced to the second part of duodenum. The upper                            GI endoscopy was accomplished without  difficulty.                            The patient tolerated the procedure well. Scope In: Scope Out: Findings:                 The esophagus was normal.                           The entire examined stomach was normal. Biopsies                            were taken with a cold forceps for histology.                           The cardia and gastric fundus were normal on                            retroflexion. (Hill grade 3)                           The examined duodenum was normal. Complications:            No immediate complications. Estimated Blood Loss:     Estimated blood loss was minimal. Impression:               - Normal esophagus.                           - Normal stomach. Biopsied.                           - Normal examined duodenum. Recommendation:           - Patient has a contact number available for                            emergencies. The signs and symptoms of potential                            delayed complications were discussed with the  patient. Return to normal activities tomorrow.                            Written discharge instructions were provided to the                            patient.                           - Resume previous diet.                           - Continue present medications.                           - Await pathology results.                           - See the other procedure note for documentation of                            additional recommendations. Michele Chicoine L. Myrtie Neither, MD 04/14/2020 3:56:55 PM This report has been signed electronically.

## 2020-04-15 ENCOUNTER — Telehealth: Payer: Self-pay

## 2020-04-15 ENCOUNTER — Other Ambulatory Visit: Payer: Self-pay

## 2020-04-15 DIAGNOSIS — K5900 Constipation, unspecified: Secondary | ICD-10-CM

## 2020-04-15 NOTE — Telephone Encounter (Signed)
Per 04/14/20 procedure note  - Breath testing to evaluate for SIBO, anorectal manometry  Order form completed and faxed to [facility] at [phone].  Spoke with patient, she is aware that the SIBO breath test order has been faxed to [facility] and they will be in contact with her soon. Advised that I will leave the kit at the receptionist desk for her to pick up at her convenience between 8 AM - 4:30 PM, Monday through Friday. Patient is aware that if she has any questions on how to collect the sample she will need to call [facility].   Patient states that she will be available April 8th for the anorectal manometry. Patient is scheduled for an anorectal manometry on Friday, 05/15/20 at 8:30 AM. COVID test is scheduled for Tuesday, 05/12/20 at 8 AM. Patient is aware that I will send this information to her via My Chart as well. Patient verbalized understanding of all information and had no concerns at the end of the call.

## 2020-04-16 ENCOUNTER — Telehealth: Payer: Self-pay

## 2020-04-16 NOTE — Telephone Encounter (Signed)
  Follow up Call-  Call back number 04/14/2020  Post procedure Call Back phone  # [phone]  Permission to leave phone message Yes  Some recent data might be hidden     Patient questions:  Do you have a fever, pain , or abdominal swelling? No. Pain Score  0 *  Have you tolerated food without any problems? Yes.    Have you been able to return to your normal activities? Yes.    Do you have any questions about your discharge instructions: Diet   No. Medications  No. Follow up visit  No.  Do you have questions or concerns about your Care? No.  Actions: * If pain score is 4 or above: No action needed, pain <4.  1. Have you developed a fever since your procedure? no  2.   Have you had an respiratory symptoms (SOB or cough) since your procedure? no  3.   Have you tested positive for COVID 19 since your procedure no  4.   Have you had any family members/close contacts diagnosed with the COVID 19 since your procedure?  no   If yes to any of these questions please route to Laverna Peace, RN and Karlton Lemon, RN

## 2020-05-06 ENCOUNTER — Other Ambulatory Visit: Payer: Self-pay

## 2020-05-06 ENCOUNTER — Emergency Department (INDEPENDENT_AMBULATORY_CARE_PROVIDER_SITE_OTHER)
Admission: EM | Admit: 2020-05-06 | Discharge: 2020-05-06 | Disposition: A | Discharge status: Home / Self Care | Payer: No Typology Code available for payment source | Source: Home / Self Care | Attending: Family Medicine | Admitting: Family Medicine

## 2020-05-06 ENCOUNTER — Encounter: Payer: Self-pay | Admitting: Emergency Medicine

## 2020-05-06 DIAGNOSIS — M7581 Other shoulder lesions, right shoulder: Secondary | ICD-10-CM | POA: Diagnosis not present

## 2020-05-06 DIAGNOSIS — M25511 Pain in right shoulder: Secondary | ICD-10-CM | POA: Diagnosis not present

## 2020-05-06 MED ORDER — METHYLPREDNISOLONE 4 MG PO TBPK: ORAL_TABLET | ORAL | 0 refills | Status: DC

## 2020-05-06 MED ORDER — TIZANIDINE HCL 4 MG PO TABS: 4.0000 mg | ORAL_TABLET | Freq: Every day | ORAL | 0 refills | Status: DC

## 2020-05-06 NOTE — Discharge Instructions (Signed)
Take Medrol Dosepak first.  Take all of day 1 today.  After this follow the schedule.  This is an anti-inflammatory to reduce pain and swelling around your right shoulder After the Medrol is complete take Aleve 2 pills in the morning and 2 at night with food as needed for pain Take muscle relaxer at night Try some stretching and rotator cuff exercises at home  See sports medicine if not improving in a week

## 2020-05-06 NOTE — ED Provider Notes (Signed)
[facility]    CSN: [id number] Arrival date & time: 05/06/20  1539      History   Chief Complaint Chief Complaint  Patient presents with  . Shoulder Pain    HPI Michele Cohen is a 40 y.o. female.   HPI   Patient is here for shoulder pain.  Hurts in the right shoulder.  It radiates down to the right deltoid area.  She has had no accident or injury.  No exercise.  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  She does not do a lot of overhead reach.  Pain is keeping her awake at night.  Is been getting progressively worse over it 100-month period of time.  Patient denies any ongoing medical problems.  She is in good health.  Past Medical History:  Diagnosis Date  . Anemia   . Concussion   . Diverticulosis   . Headache disorder   . History of benign breast tumor   . Lumbar radiculopathy   . Pulmonary embolism (HCC) 2019  . Renal cyst   . Stress headaches     Patient Active Problem List   Diagnosis Date Noted  . 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 BRBPR (bright red blood per rectum) 03/13/2017  . Dysuria 03/13/2017  . Headache 03/13/2017  . Pulmonary embolus (HCC) 03/13/2017  . S/P hysterectomy 03/13/2017  . 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 Shortness of breath 03/13/2017  . Vaginitis 03/13/2017  . Breast lump 07/20/2015  . Dense breasts 07/20/2015  . Family history of breast cancer 07/20/2015    Past Surgical History:  Procedure Laterality Date  . ABDOMINAL HYSTERECTOMY    . BREAST CYST EXCISION    . COLONOSCOPY  04/14/2020   2020 and 2005  . fallopian tube      removal   . UPPER GASTROINTESTINAL ENDOSCOPY  04/14/2020   2006    OB History   No obstetric history on file.      Home Medications    Prior to Admission medications   Medication Sig Start Date End Date Taking? Authorizing Provider  methylPREDNISolone (MEDROL DOSEPAK) 4 MG TBPK tablet tad 05/06/20  Yes Eustace Moore, MD  tiZANidine (ZANAFLEX) 4 MG tablet Take  1-2 tablets (4-8 mg total) by mouth at bedtime. 05/06/20  Yes Eustace Moore, MD  ondansetron (ZOFRAN ODT) 4 MG disintegrating tablet Take 1 tablet (4 mg total) by mouth every 8 (eight) hours as needed. 01/31/19   Vanetta Mulders, MD  polyethylene glycol powder (MIRALAX) 17 GM/SCOOP powder Take 17 g by mouth daily as needed for moderate constipation. 10/23/19   Bing Neighbors, FNP    Family History Family History  Problem Relation Age of Onset  . Breast cancer Mother   . Heart attack Mother   . Diabetes Mother   . Hypertension Mother   . Heart failure Father   . Diabetes Father   . Hypertension Father   . Pancreatic cancer Maternal Grandfather   . Colon cancer Paternal Grandfather 32       70s ?  Marland Kitchen Colon polyps Paternal Grandfather   . Esophageal cancer Neg Hx   . Rectal cancer Neg Hx   . Stomach cancer Neg Hx     Social History Social History   Tobacco Use  . Smoking status: Never Smoker  . Smokeless tobacco: Never Used  Vaping Use  . Vaping Use: Never used  Substance Use Topics  . Alcohol use: No    Comment: occasional  .  Drug use: Never     Allergies   Aztreonam, Phenazopyridine, Shellfish allergy, and Zolpidem tartrate   Review of Systems Review of Systems See HPI  Physical Exam Triage Vital Signs ED Triage Vitals  Enc Vitals Group     BP 05/06/20 1553 123/87     Pulse Rate 05/06/20 1553 88     Resp 05/06/20 1553 16     Temp 05/06/20 1553 98.7 F (37.1 C)     Temp Source 05/06/20 1553 Oral     SpO2 05/06/20 1553 98 %     Weight --      Height --      Head Circumference --      Peak Flow --      Pain Score 05/06/20 1550 10     Pain Loc --      Pain Edu? --      Excl. in GC? --    No data found.  Updated Vital Signs BP 123/87 (BP Location: Left Arm)   Pulse 88   Temp 98.7 F (37.1 C) (Oral)   Resp 16   LMP 12/15/2011   SpO2 98%       Physical Exam Constitutional:      General: She is not in acute distress.    Appearance: She  is well-developed and normal weight.  HENT:     Head: Normocephalic and atraumatic.     Mouth/Throat:     Comments: Mask is in place Eyes:     Conjunctiva/sclera: Conjunctivae normal.     Pupils: Pupils are equal, round, and reactive to light.  Cardiovascular:     Rate and Rhythm: Normal rate.  Pulmonary:     Effort: Pulmonary effort is normal. No respiratory distress.  Abdominal:     General: There is no distension.     Palpations: Abdomen is soft.  Musculoskeletal:        General: Normal range of motion.     Cervical back: Normal range of motion.     Comments: Right shoulder has mild tenderness.  Mild tenderness in the upper body of the right trapezius.  Full range of motion of neck.  Strength sensation range of motion and reflexes are normal in both upper extremities and symmetric with the exception of abduction of right shoulder with limitation also in internal rotation.  Negative empty can test  Skin:    General: Skin is warm and dry.  Neurological:     Mental Status: She is alert.      UC Treatments / Results  Labs (all labs ordered are listed, but only abnormal results are displayed) Labs Reviewed - No data to display  EKG   Radiology No results found.  Procedures Procedures (including critical care time)  Medications Ordered in UC Medications - No data to display  Initial Impression / Assessment and Plan / UC Course  I have reviewed the triage vital signs and the nursing notes.  Pertinent labs & imaging results that were available during my care of the patient were reviewed by me and considered in my medical decision making (see chart for details).     *Patient has pain with abduction and internal rotation of the shoulder.  I believe she has rotator cuff tendinitis.  We will treat with prednisone and home exercises.  Follow-up with sports medicine if she fails to improve Final Clinical Impressions(s) / UC Diagnoses   Final diagnoses:  Rotator cuff  tendinitis, right  Acute pain of right shoulder  Discharge Instructions     Take Medrol Dosepak first.  Take all of day 1 today.  After this follow the schedule.  This is an anti-inflammatory to reduce pain and swelling around your right shoulder After the Medrol is complete take Aleve 2 pills in the morning and 2 at night with food as needed for pain Take muscle relaxer at night Try some stretching and rotator cuff exercises at home  See sports medicine if not improving in a week   ED Prescriptions    Medication Sig Dispense Auth. Provider   methylPREDNISolone (MEDROL DOSEPAK) 4 MG TBPK tablet tad 21 tablet Eustace Moore, MD   tiZANidine (ZANAFLEX) 4 MG tablet Take 1-2 tablets (4-8 mg total) by mouth at bedtime. 20 tablet Eustace Moore, MD     PDMP not reviewed this encounter.   Eustace Moore, MD 05/06/20 [id number]

## 2020-05-06 NOTE — ED Triage Notes (Signed)
Patient presents to Urgent Care with complaints of right arm pain since x 2 months, progressive worseing. Patient reports right shoulder pain, radiating down the right deltoid. Worst with movement and at rest. Limited mobility. Pain with laying on the arm. Taking Tylenol, Advil, Ibuprofen.

## 2020-05-12 ENCOUNTER — Other Ambulatory Visit (HOSPITAL_COMMUNITY): Payer: No Typology Code available for payment source

## 2020-05-12 NOTE — Telephone Encounter (Signed)
Patient called and stated she totally forgot she was scheduled for a procedure 05/15/20 and would need to reschedule so she can take the time off work and prepare.

## 2020-05-12 NOTE — Telephone Encounter (Signed)
Lm on vm for patient to return call to discuss changed appt information.   Ano rectal manometry rescheduled to Wednesday, 06/10/20 at 8:30 AM. Pre-screening COVID test is Monday, 06/08/20 at 8:00 AM.

## 2020-05-13 NOTE — Telephone Encounter (Signed)
Lm on vm for patient to return call 

## 2020-05-15 NOTE — Telephone Encounter (Signed)
Lm on vm for patient to return call 

## 2020-05-19 NOTE — Telephone Encounter (Signed)
No return call received. Sent letter via my chart and mailed a letter to patient with new appointment information.

## 2020-06-08 ENCOUNTER — Other Ambulatory Visit (HOSPITAL_COMMUNITY): Payer: No Typology Code available for payment source

## 2020-06-09 NOTE — Progress Notes (Signed)
Talked with patient regarding AR scheduled  For Wednesday May 3.  Patient stated that no one told her about the appointment for tomorrow. Patient rescheduled for June. Patient stated the month of may was not good for her. Patient chose June 24 due to her schedule. She was offered other days in the month.

## 2020-07-30 NOTE — Progress Notes (Signed)
Called patient and she was not aware of appointment and she needs to cancel it. I informed her that she needs to call Dr. Myrtie Neither office to reschedule.

## 2020-07-31 ENCOUNTER — Ambulatory Visit (HOSPITAL_COMMUNITY)
Admission: RE | Admit: 2020-07-31 | Payer: No Typology Code available for payment source | Source: Home / Self Care | Admitting: Gastroenterology

## 2020-07-31 ENCOUNTER — Encounter (HOSPITAL_COMMUNITY): Admission: RE | Payer: Self-pay | Source: Home / Self Care

## 2020-07-31 SURGERY — MANOMETRY, ANORECTAL
Anesthesia: Choice

## 2020-11-20 ENCOUNTER — Emergency Department (HOSPITAL_BASED_OUTPATIENT_CLINIC_OR_DEPARTMENT_OTHER)
Admission: EM | Admit: 2020-11-20 | Discharge: 2020-11-20 | Disposition: A | Discharge status: Home / Self Care | Payer: No Typology Code available for payment source | Attending: Emergency Medicine | Admitting: Emergency Medicine

## 2020-11-20 ENCOUNTER — Other Ambulatory Visit: Payer: Self-pay

## 2020-11-20 ENCOUNTER — Emergency Department (HOSPITAL_BASED_OUTPATIENT_CLINIC_OR_DEPARTMENT_OTHER): Payer: No Typology Code available for payment source

## 2020-11-20 ENCOUNTER — Encounter (HOSPITAL_BASED_OUTPATIENT_CLINIC_OR_DEPARTMENT_OTHER): Payer: Self-pay | Admitting: Emergency Medicine

## 2020-11-20 DIAGNOSIS — R1032 Left lower quadrant pain: Secondary | ICD-10-CM | POA: Diagnosis present

## 2020-11-20 DIAGNOSIS — K5792 Diverticulitis of intestine, part unspecified, without perforation or abscess without bleeding: Secondary | ICD-10-CM | POA: Diagnosis not present

## 2020-11-20 DIAGNOSIS — R109 Unspecified abdominal pain: Secondary | ICD-10-CM

## 2020-11-20 LAB — COMPREHENSIVE METABOLIC PANEL
ALT: 28 U/L (ref 0–44)
AST: 18 U/L (ref 15–41)
Albumin: 4.3 g/dL (ref 3.5–5.0)
Alkaline Phosphatase: 58 U/L (ref 38–126)
Anion gap: 6 (ref 5–15)
BUN: 10 mg/dL (ref 6–20)
CO2: 28 mmol/L (ref 22–32)
Calcium: 9.2 mg/dL (ref 8.9–10.3)
Chloride: 102 mmol/L (ref 98–111)
Creatinine, Ser: 0.92 mg/dL (ref 0.44–1.00)
GFR, Estimated: >60 mL/min (ref >60)
Glucose, Bld: 90 mg/dL (ref 70–99)
Potassium: 4.4 mmol/L (ref 3.5–5.1)
Sodium: 136 mmol/L (ref 135–145)
Total Bilirubin: 0.5 mg/dL (ref 0.3–1.2)
Total Protein: 7.6 g/dL (ref 6.5–8.1)

## 2020-11-20 LAB — URINALYSIS, ROUTINE W REFLEX MICROSCOPIC
Bilirubin Urine: NEGATIVE
Glucose, UA: NEGATIVE mg/dL
Hgb urine dipstick: NEGATIVE
Ketones, ur: NEGATIVE mg/dL
Leukocytes,Ua: NEGATIVE
Nitrite: NEGATIVE
Protein, ur: NEGATIVE mg/dL
Specific Gravity, Urine: >1.03 — ABNORMAL HIGH (ref 1.005–1.030)
pH: 6 (ref 5.0–8.0)

## 2020-11-20 LAB — CBC
HCT: 43.5 % (ref 36.0–46.0)
Hemoglobin: 14.6 g/dL (ref 12.0–15.0)
MCH: 29 pg (ref 26.0–34.0)
MCHC: 33.6 g/dL (ref 30.0–36.0)
MCV: 86.3 fL (ref 80.0–100.0)
Platelets: 261 10*3/uL (ref 150–400)
RBC: 5.04 MIL/uL (ref 3.87–5.11)
RDW: 12.5 % (ref 11.5–15.5)
WBC: 4 10*3/uL (ref 4.0–10.5)
nRBC: 0 % (ref 0.0–0.2)

## 2020-11-20 LAB — LIPASE, BLOOD: Lipase: 43 U/L (ref 11–51)

## 2020-11-20 IMAGING — CT CT RENAL STONE PROTOCOL
2 of 4 series · 16 of 46 positions shown, 18 images · non-contrast
Comparison: [DATE]

CLINICAL DATA: One month of worsening severe back and suprapubic
pain with hematuria

EXAM:
CT ABDOMEN AND PELVIS WITHOUT CONTRAST
TECHNIQUE: Multidetector CT imaging of the abdomen and pelvis was performed
following the standard protocol without IV contrast.

[Series 2: axial st · axial · 0.80mm/px · z∈[+591,+986]mm · 13 of 87 slices shown, 15 images]
[im 4/87  soft-tissue]
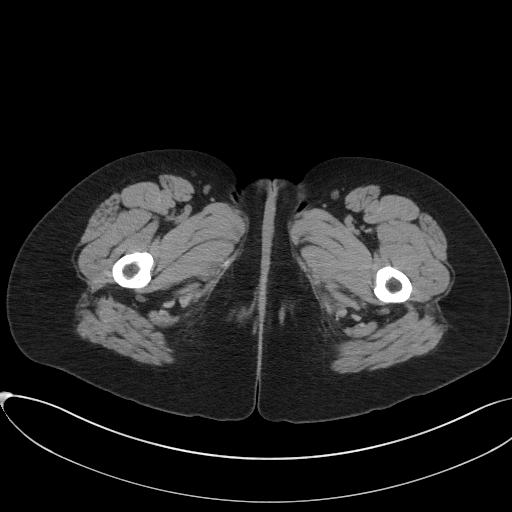
[im 4/87  bone]
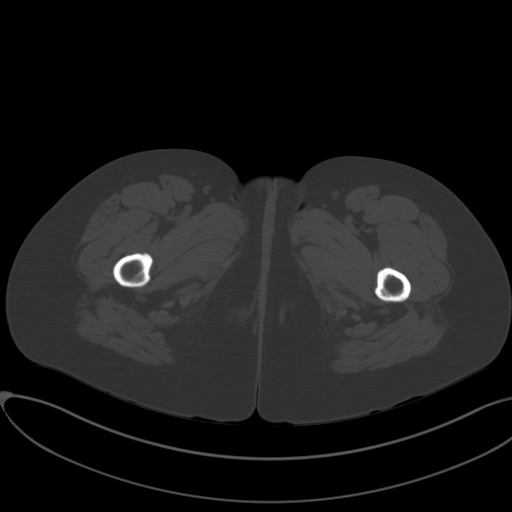
[im 11/87  soft-tissue]
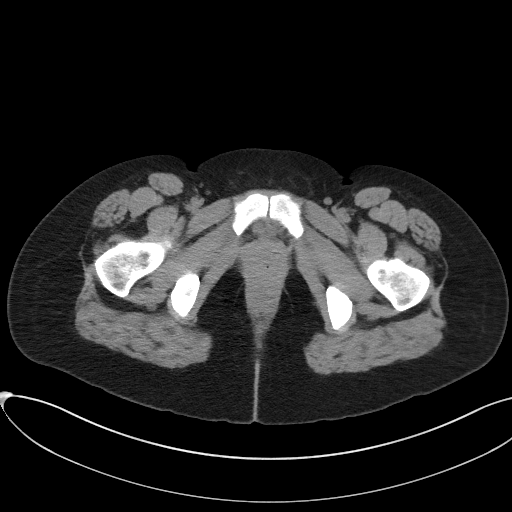
[im 18/87  soft-tissue]
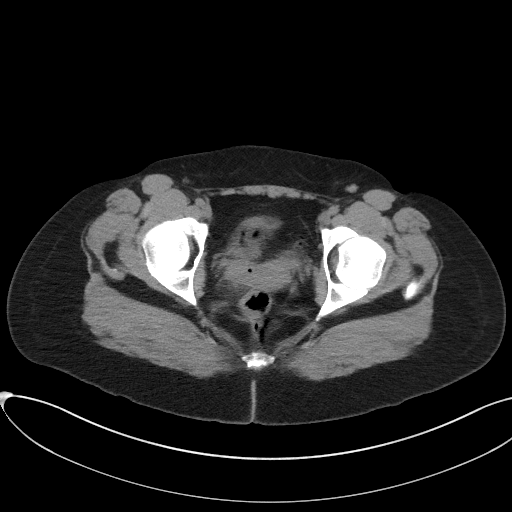
[im 25/87  soft-tissue]
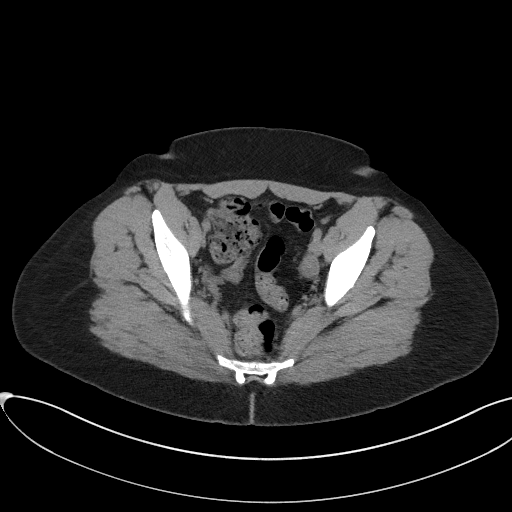
[im 31/87  soft-tissue]
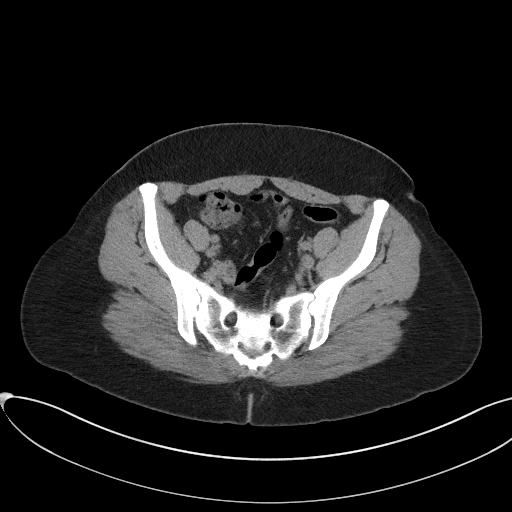
[im 38/87  soft-tissue]
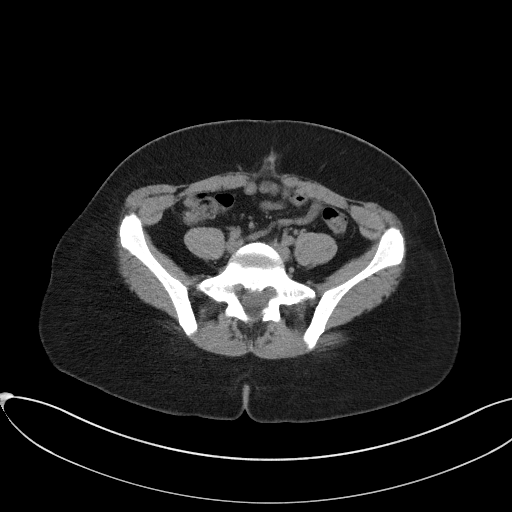
[im 45/87  soft-tissue]
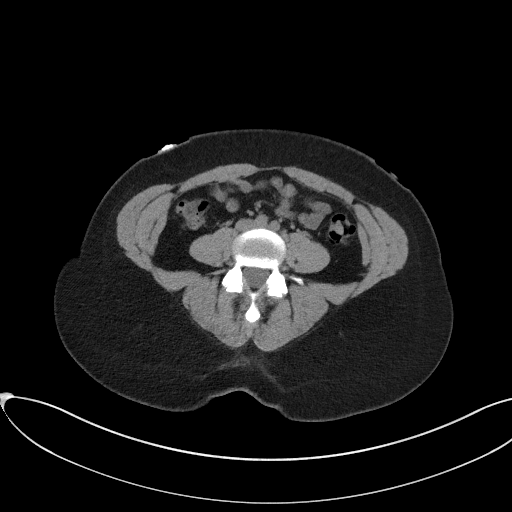
[im 49/87  soft-tissue]
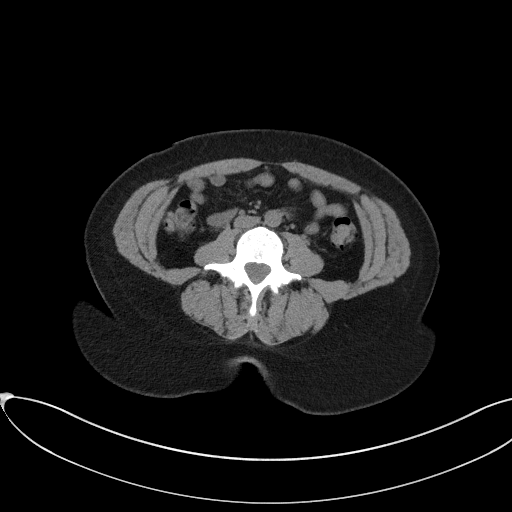
[im 56/87  soft-tissue]
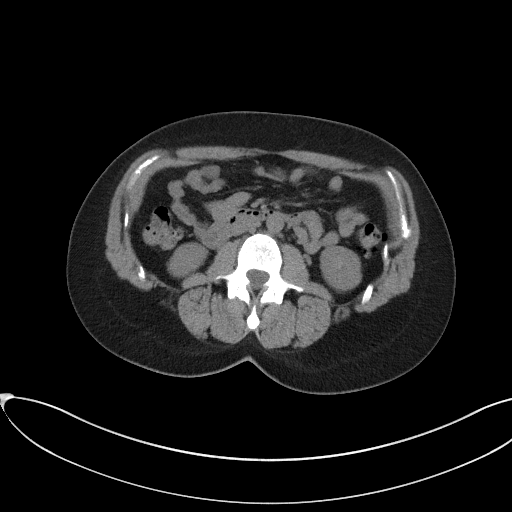
[im 56/87  bone]
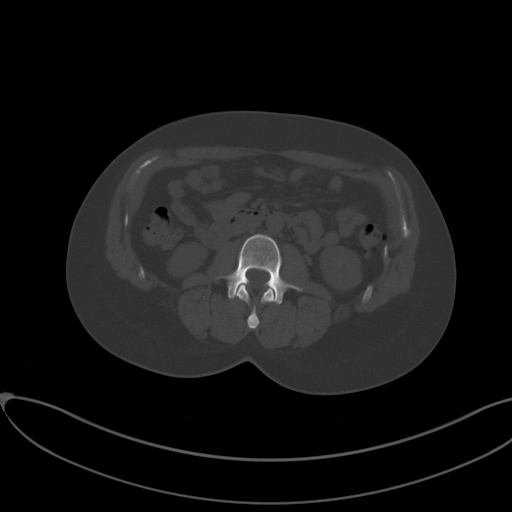
[im 62/87  soft-tissue]
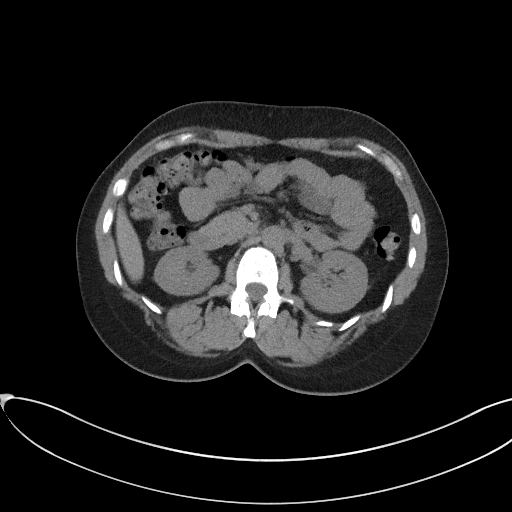
[im 69/87  soft-tissue]
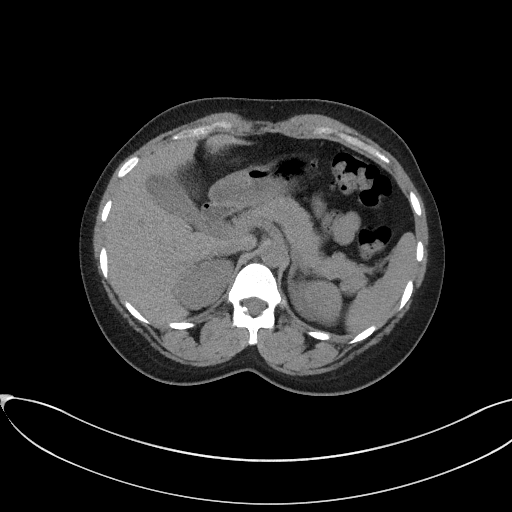
[im 76/87  soft-tissue]
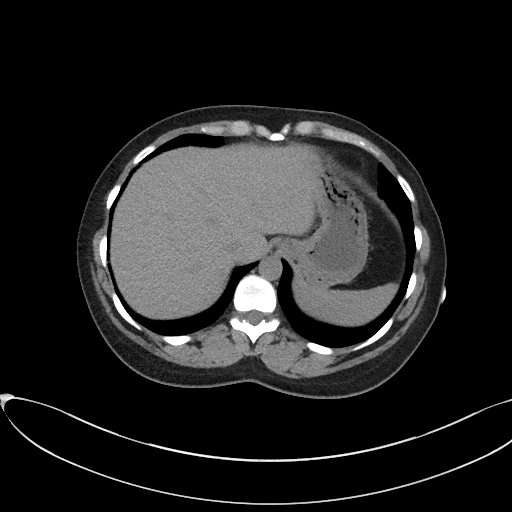
[im 83/87  soft-tissue]
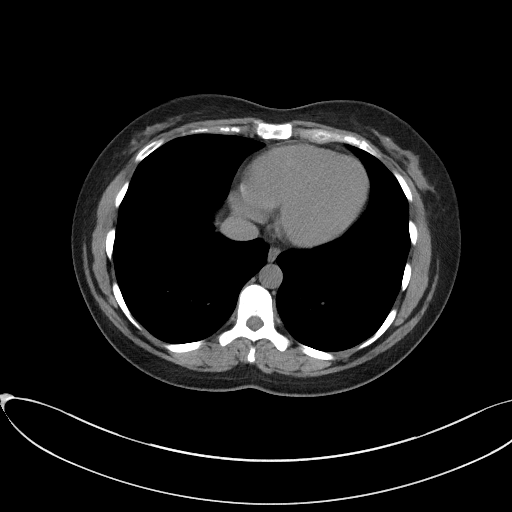

[Series 5: coronal st · coronal · 0.65mm/px · 3 of 101 slices shown]
[im 34/101  soft-tissue]
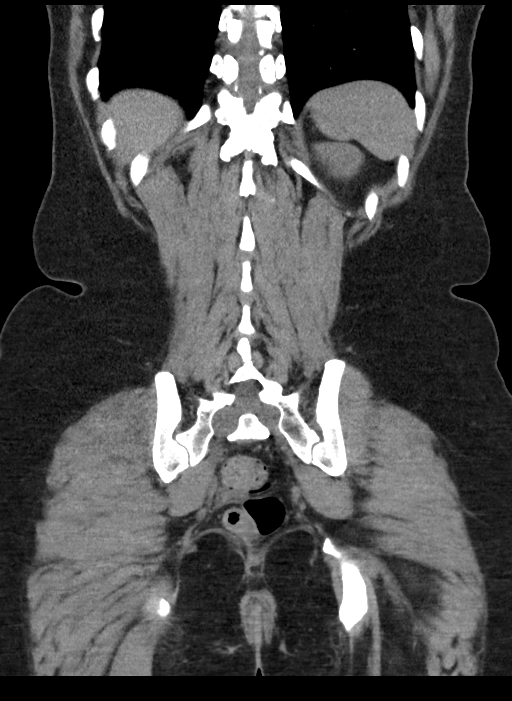
[im 45/101  soft-tissue]
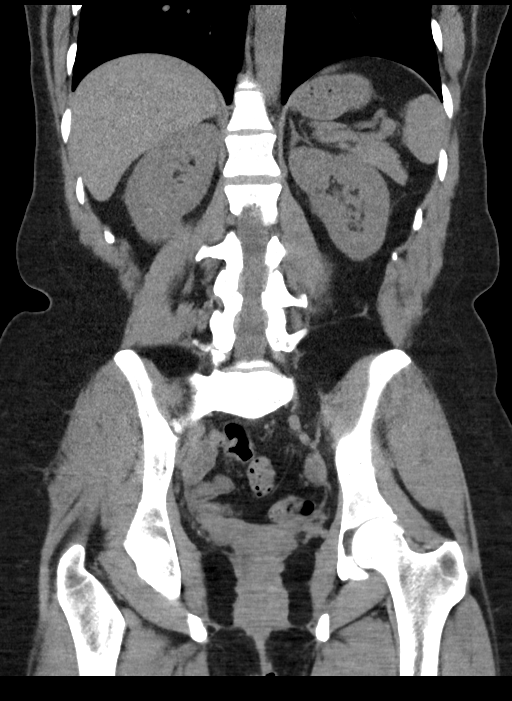
[im 56/101  soft-tissue]
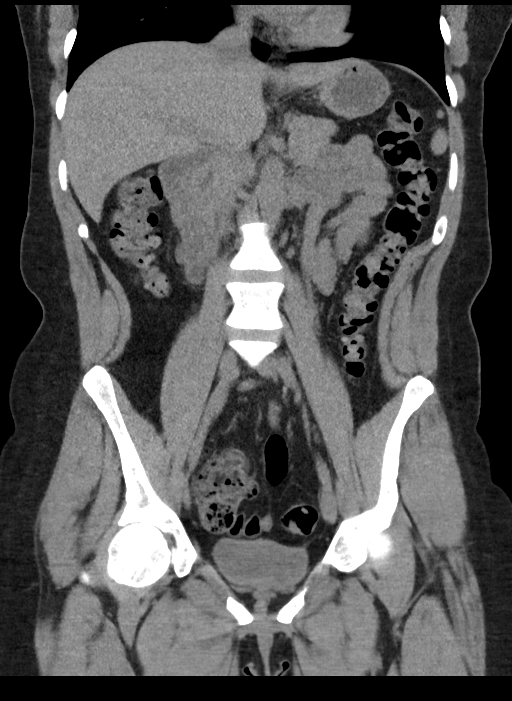

[16 of 46 positions shown; findings below may reference images not displayed]

FINDINGS: Lower chest: No acute abnormality.

Hepatobiliary: Unremarkable noncontrast appearance of the hepatic
parenchyma. Gallbladder is unremarkable. No biliary ductal dilation.

Pancreas: No pancreatic ductal dilatation or surrounding
inflammatory changes.

Spleen: Within normal limits.

Adrenals/Urinary Tract: Bilateral adrenal glands are unremarkable.
No hydronephrosis. Punctate nonobstructive bilateral renal stones
measuring up to 1-2 mm. No obstructive ureteral or bladder calculi
visualized. Urinary bladder is decompressed.

Stomach/Bowel: Stomach is unremarkable for degree of distension. No
pathologic dilation of small large bowel. The appendix and terminal
ileum are normal. Extensive colonic diverticulosis with very mild
inflammation along short segment of sigmoid colon.

Vascular/Lymphatic: No significant vascular findings are present. No
enlarged abdominal or pelvic lymph nodes.

Reproductive: Status post hysterectomy. No adnexal masses.

Other: No walled off fluid collections. No pneumoperitoneum. No
portal venous gas.

Musculoskeletal: No acute or significant osseous findings.
IMPRESSION: 1. Extensive colonic diverticulosis with very mild inflammation
along short segment of sigmoid colon, which may represent mild acute
diverticulitis. No evidence of perforation or abscess formation.
2. Punctate nonobstructive bilateral renal stones. No obstructive
ureteral or bladder calculi visualized.

## 2020-11-20 MED ORDER — ONDANSETRON 4 MG PO TBDP
4.0000 mg | ORAL_TABLET | Freq: Once | ORAL | Status: AC | PRN
  Administered 2020-11-20: 4 mg via ORAL
  Filled 2020-11-20: qty 1

## 2020-11-20 MED ORDER — AMOXICILLIN-POT CLAVULANATE 875-125 MG PO TABS: 1.0000 | ORAL_TABLET | Freq: Two times a day (BID) | ORAL | 0 refills | Status: DC

## 2020-11-20 NOTE — ED Notes (Signed)
Pt now c/o chest pain while in waiting room. EKG performed.

## 2020-11-20 NOTE — ED Provider Notes (Signed)
[facility] EMERGENCY DEPARTMENT Provider Note   CSN: [id number] Arrival date & time: 11/20/20  1629     History Chief Complaint  Patient presents with   Abdominal Pain    Michele Cohen is a 40 y.o. female.  The history is provided by the patient.  Abdominal Pain Pain location:  LLQ Pain quality: aching   Pain radiates to:  Does not radiate Pain severity:  Mild Onset quality:  Gradual Timing:  Constant Progression:  Unchanged Chronicity:  New Context: not previous surgeries   Relieved by:  Nothing Worsened by:  Nothing Associated symptoms: no chest pain, no chills, no cough, no dysuria, no fever, no hematuria, no shortness of breath, no sore throat and no vomiting   Risk factors: has not had multiple surgeries       Past Medical History:  Diagnosis Date   Anemia    Concussion    Diverticulosis    Headache disorder    History of benign breast tumor    Lumbar radiculopathy    Pulmonary embolism (HCC) 2019   Renal cyst    Stress headaches     Patient Active Problem List   Diagnosis Date Noted   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   BRBPR (bright red blood per rectum) 03/13/2017   Dysuria 03/13/2017   Headache 03/13/2017   Pulmonary embolus (HCC) 03/13/2017   S/P hysterectomy 03/13/2017   Septic thrombophlebitis 03/13/2017   Shortness of breath 03/13/2017   Vaginitis 03/13/2017   Breast lump 07/20/2015   Dense breasts 07/20/2015   Family history of breast cancer 07/20/2015    Past Surgical History:  Procedure Laterality Date   ABDOMINAL HYSTERECTOMY     BREAST CYST EXCISION     COLONOSCOPY  04/14/2020   2020 and 2005   fallopian tube      removal    UPPER GASTROINTESTINAL ENDOSCOPY  04/14/2020   2006     OB History   No obstetric history on file.     Family History  Problem Relation Age of Onset   Breast cancer Mother    Heart attack Mother    Diabetes Mother    Hypertension Mother    Heart failure Father    Diabetes  Father    Hypertension Father    Pancreatic cancer Maternal Grandfather    Colon cancer Paternal Grandfather 7       25s ?   Colon polyps Paternal Grandfather    Esophageal cancer Neg Hx    Rectal cancer Neg Hx    Stomach cancer Neg Hx     Social History   Tobacco Use   Smoking status: Never   Smokeless tobacco: Never  Vaping Use   Vaping Use: Never used  Substance Use Topics   Alcohol use: No    Comment: occasional   Drug use: Never    Home Medications Prior to Admission medications   Medication Sig Start Date End Date Taking? Authorizing Provider  amoxicillin-clavulanate (AUGMENTIN) 875-125 MG tablet Take 1 tablet by mouth 2 (two) times daily for 10 days. 11/20/20 11/30/20 Yes Keen Ewalt, DO  methylPREDNISolone (MEDROL DOSEPAK) 4 MG TBPK tablet tad 05/06/20   Eustace Moore, MD  ondansetron (ZOFRAN ODT) 4 MG disintegrating tablet Take 1 tablet (4 mg total) by mouth every 8 (eight) hours as needed. 01/31/19   Vanetta Mulders, MD  polyethylene glycol powder (MIRALAX) 17 GM/SCOOP powder Take 17 g by mouth daily as needed for moderate constipation. 10/23/19   Joaquin Courts  S, FNP  tiZANidine (ZANAFLEX) 4 MG tablet Take 1-2 tablets (4-8 mg total) by mouth at bedtime. 05/06/20   Eustace Moore, MD    Allergies    Aztreonam, Phenazopyridine, Shellfish allergy, and Zolpidem tartrate  Review of Systems   Review of Systems  Constitutional:  Negative for chills and fever.  HENT:  Negative for ear pain and sore throat.   Eyes:  Negative for pain and visual disturbance.  Respiratory:  Negative for cough and shortness of breath.   Cardiovascular:  Negative for chest pain and palpitations.  Gastrointestinal:  Positive for abdominal pain. Negative for vomiting.  Genitourinary:  Negative for dysuria and hematuria.  Musculoskeletal:  Negative for arthralgias and back pain.  Skin:  Negative for color change and rash.  Neurological:  Negative for seizures and syncope.   All other systems reviewed and are negative.  Physical Exam Updated Vital Signs BP 124/82 (BP Location: Right Arm)   Pulse 75   Temp 98.2 F (36.8 C) (Oral)   Resp 16   Ht 5\' 2"  (1.575 m)   Wt 68 kg   LMP 12/15/2011   SpO2 100%   BMI 27.44 kg/m   Physical Exam Vitals and nursing note reviewed.  Constitutional:      General: Michele Cohen.    Appearance: Michele Cohen.  HENT:     Head: Normocephalic and atraumatic.  Eyes:     Conjunctiva/sclera: Conjunctivae normal.  Cardiovascular:     Rate and Rhythm: Normal rate and regular rhythm.     Heart sounds: No murmur heard. Pulmonary:     Effort: Pulmonary effort is normal. No respiratory Cohen.     Breath sounds: Normal breath sounds.  Abdominal:     Palpations: Abdomen is soft.     Tenderness: There is abdominal tenderness in the left upper quadrant and left lower quadrant.  Musculoskeletal:     Cervical back: Neck supple.  Skin:    General: Skin is warm and dry.     Capillary Refill: Capillary refill takes less than 2 seconds.  Neurological:     General: No focal deficit present.     Mental Status: Michele Cohen.  Psychiatric:        Mood and Affect: Mood normal.    ED Results / Procedures / Treatments   Labs (all labs ordered are listed, but only abnormal results are displayed) Labs Reviewed  URINALYSIS, ROUTINE W REFLEX MICROSCOPIC - Abnormal; Notable for the following components:      Result Value   Specific Gravity, Urine >1.030 (*)    All other components within normal limits  LIPASE, BLOOD  COMPREHENSIVE METABOLIC PANEL  CBC    EKG EKG Interpretation  Date/Time:  Friday November 20 2020 17:45:33 EDT Ventricular Rate:  87 PR Interval:  138 QRS Duration: 72 QT Interval:  346 QTC Calculation: 416 R Axis:   74 Text Interpretation: Normal sinus rhythm Normal ECG Confirmed by [id number] ([id number]) on 11/20/2020 6:13:00 PM  Radiology CT Renal Stone Study  Result Date:  11/20/2020 CLINICAL DATA:  One month of worsening severe back and suprapubic pain with hematuria EXAM: CT ABDOMEN AND PELVIS WITHOUT CONTRAST TECHNIQUE: Multidetector CT imaging of the abdomen and pelvis was performed following the standard protocol without IV contrast. COMPARISON:  January 31, 2019 FINDINGS: Lower chest: No acute abnormality. Hepatobiliary: Unremarkable noncontrast appearance of the hepatic parenchyma. Gallbladder is unremarkable. No biliary ductal dilation. Pancreas: No pancreatic ductal dilatation or surrounding inflammatory  changes. Spleen: Within normal limits. Adrenals/Urinary Tract: Bilateral adrenal glands are unremarkable. No hydronephrosis. Punctate nonobstructive bilateral renal stones measuring up to 1-2 mm. No obstructive ureteral or bladder calculi visualized. Urinary bladder is decompressed. Stomach/Bowel: Stomach is unremarkable for degree of distension. No pathologic dilation of small large bowel. The appendix and terminal ileum are normal. Extensive colonic diverticulosis with very mild inflammation along short segment of sigmoid colon. Vascular/Lymphatic: No significant vascular findings are present. No enlarged abdominal or pelvic lymph nodes. Reproductive: Status post hysterectomy. No adnexal masses. Other: No walled off fluid collections. No pneumoperitoneum. No portal venous gas. Musculoskeletal: No acute or significant osseous findings. IMPRESSION: 1. Extensive colonic diverticulosis with very mild inflammation along short segment of sigmoid colon, which may represent mild acute diverticulitis. No evidence of perforation or abscess formation. 2. Punctate nonobstructive bilateral renal stones. No obstructive ureteral or bladder calculi visualized. Electronically Signed   By: Maudry Mayhew M.D.   On: 11/20/2020 19:42    Procedures Procedures   Medications Ordered in ED Medications  ondansetron (ZOFRAN-ODT) disintegrating tablet 4 mg (4 mg Oral Given 11/20/20 1644)     ED Course  I have reviewed the triage vital signs and the nursing notes.  Pertinent labs & imaging results that were available during my care of the patient were reviewed by me and considered in my medical decision making (see chart for details).    MDM Rules/Calculators/A&P                           Tarri Guilfoil is here with left-sided abdominal pain.  Has been having cramping pain throughout Michele Cohen abdomen and low back but worse in the left lower side.  Saw OB/GYN with unremarkable work-up.  Concern for diverticulitis versus kidney stone versus UTI.  Lab work showed no significant anemia, electrolyte ab Mody, kidney injury.  Urinalysis negative for infection.  CT scan showed mild acute diverticulitis.  Will treat with antibiotics.  We will have Michele Cohen follow-up with Michele Cohen primary care doctor.  Michele Cohen understands return precautions.  Discharged in good condition.  This chart was dictated using voice recognition software.  Despite best efforts to proofread,  errors can occur which can change the documentation meaning.   Final Clinical Impression(s) / ED Diagnoses Final diagnoses:  Abdominal pain, unspecified abdominal location  Diverticulitis    Rx / DC Orders ED Discharge Orders          Ordered    amoxicillin-clavulanate (AUGMENTIN) 875-125 MG tablet  2 times daily        11/20/20 1959             Virgina Norfolk, DO 11/20/20 2001

## 2020-11-20 NOTE — Discharge Instructions (Signed)
Please return if you have worsening abdominal pain, fever.

## 2020-11-20 NOTE — ED Triage Notes (Signed)
Pt presents to ED POC. Pt c/o severe back pain and suprapubic abd pain, hematuria, nausea. Pt reports that pain has been going on for month but worsened yesterday.

## 2020-11-25 ENCOUNTER — Encounter: Payer: Self-pay | Admitting: Medical-Surgical

## 2020-11-25 ENCOUNTER — Ambulatory Visit (INDEPENDENT_AMBULATORY_CARE_PROVIDER_SITE_OTHER): Payer: No Typology Code available for payment source | Admitting: Medical-Surgical

## 2020-11-25 VITALS — BP 123/87 | HR 86 | Resp 20 | Ht 62.0 in | Wt 150.0 lb

## 2020-11-25 DIAGNOSIS — M2550 Pain in unspecified joint: Secondary | ICD-10-CM

## 2020-11-25 DIAGNOSIS — M7918 Myalgia, other site: Secondary | ICD-10-CM | POA: Diagnosis not present

## 2020-11-25 DIAGNOSIS — Z09 Encounter for follow-up examination after completed treatment for conditions other than malignant neoplasm: Secondary | ICD-10-CM | POA: Diagnosis not present

## 2020-11-25 DIAGNOSIS — Z7689 Persons encountering health services in other specified circumstances: Secondary | ICD-10-CM | POA: Diagnosis not present

## 2020-11-25 MED ORDER — PREGABALIN 25 MG PO CAPS: ORAL_CAPSULE | ORAL | 0 refills | Status: DC

## 2020-11-25 NOTE — Progress Notes (Signed)
New Patient Office Visit  Subjective:  Patient ID: Michele Cohen, female    DOB: [birth year]  Age: 40 y.o. MRN: [id number]  CC:  Chief Complaint  Patient presents with   Establish Care    HPI Michele Cohen presents to establish care. 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 Recently relocated from [place] and is now getting all her appointments taken care of as she establishes new providers. Has had a long history of fatigue, body aches, bowel issues, muscle spasms, and tingling. Was working with her previous PCP to investigate a possible explanation for her symptoms but this was put on hold and did not find any answers. There was consideration for possible autoimmune disease vs fibromyalgia. She has tried a couple of different medications including Cymbalta and Gabapentin but did not tolerate them. Is interested in resuming the workup process and possibly finding some relief for her chronic pain.   Has had some new back pain issues lately. Went to the ED where they did a CT and found diverticulitis. Has been prescribed antibiotics but just picked them up yesterday and has not taken the first dose yet.   Past Medical History:  Diagnosis Date   Anemia    Concussion    Diverticulosis    Headache disorder    History of benign breast tumor    Lumbar radiculopathy    Pulmonary embolism (HCC) 2019   Renal cyst    Stress headaches     Past Surgical History:  Procedure Laterality Date   ABDOMINAL HYSTERECTOMY     BREAST CYST EXCISION     COLONOSCOPY  04/14/2020   2020 and 2005   fallopian tube      removal    UPPER GASTROINTESTINAL ENDOSCOPY  04/14/2020   2006    Family History  Problem Relation Age of Onset   Breast cancer Mother    Heart attack Mother    Diabetes Mother    Hypertension Mother    Heart failure Father    Diabetes Father    Hypertension Father    Pancreatic cancer Maternal Grandfather    Colon  cancer Paternal Grandfather 77       69s ?   Colon polyps Paternal Grandfather    Esophageal cancer Neg Hx    Rectal cancer Neg Hx    Stomach cancer Neg Hx     Social History   Socioeconomic History   Marital status: Married    Spouse name: Not on file   Number of children: Not on file   Years of education: Not on file   Highest education level: Not on file  Occupational History   Not on file  Tobacco Use   Smoking status: Never   Smokeless tobacco: Never  Vaping Use   Vaping Use: Never used  Substance and Sexual Activity   Alcohol use: No    Comment: occasional   Drug use: Never   Sexual activity: Yes    Birth control/protection: None  Other Topics Concern   Not on file  Social History Narrative   Not on file   Social Determinants of Health   Financial Resource Strain: Not on file  Food Insecurity: Not on file  Transportation Needs: Not on file  Physical Activity: Not on file  Stress: Not on file  Social Connections: Not on file  Intimate Partner Violence: Not on file    ROS Review of Systems  Constitutional:  Positive for fatigue.  Negative for chills, fever and unexpected weight change.  HENT:  Negative for congestion, rhinorrhea, sinus pressure and sore throat.   Respiratory:  Negative for cough, chest tightness and shortness of breath.   Cardiovascular:  Negative for chest pain, palpitations and leg swelling.  Gastrointestinal:  Positive for abdominal pain, constipation and nausea. Negative for diarrhea and vomiting.  Endocrine: Negative for cold intolerance and heat intolerance.  Genitourinary:  Positive for hematuria. Negative for dysuria, frequency, urgency, vaginal bleeding and vaginal discharge.  Musculoskeletal:  Positive for arthralgias, back pain, myalgias and neck pain.  Skin:  Negative for rash and wound.  Neurological:  Positive for dizziness, light-headedness and headaches.  Hematological:  Does not bruise/bleed easily.   Psychiatric/Behavioral:  Negative for dysphoric mood, self-injury, sleep disturbance and suicidal ideas. The patient is not nervous/anxious.    Objective:   Today's Vitals: BP 123/87 (BP Location: Left Arm, Patient Position: Sitting, Cuff Size: Normal)   Pulse 86   Resp 20   Ht 5\' 2"  (1.575 m)   Wt 150 lb (68 kg)   LMP 12/15/2011   SpO2 100%   BMI 27.44 kg/m   Physical Exam Vitals reviewed.  Constitutional:      General: She is not in acute distress.    Appearance: Normal appearance. She is not ill-appearing.  HENT:     Head: Normocephalic and atraumatic.  Cardiovascular:     Rate and Rhythm: Normal rate and regular rhythm.     Pulses: Normal pulses.     Heart sounds: Normal heart sounds. No murmur heard.   No friction rub. No gallop.  Pulmonary:     Effort: Pulmonary effort is normal. No respiratory distress.     Breath sounds: Normal breath sounds. No wheezing.  Skin:    General: Skin is warm and dry.  Neurological:     Mental Status: She is alert and oriented to person, place, and time.  Psychiatric:        Mood and Affect: Mood normal.        Behavior: Behavior normal.        Thought Content: Thought content normal.        Judgment: Judgment normal.    Assessment & Plan:   1. Encounter to establish care Reviewed available information and discussed care concerns with patient.   2. Arthralgia, unspecified joint 3. Myalgia, multiple sites Some workup has been completed but unclear where it was stopped. Would like to get patient's medical records for review then make a plan going forward. Patient verbalized agreement with the plan. Discussed medications used to treat widespread body pain. She is open to suggestions so trialing Lyrica with a slow taper from 25mg  daily to 25mg  BID then finally 50mg  BID.   4. Hospital discharge follow-up Recommend completing the antibiotics prescribed for diverticulitis. Unclear if this is contributing to her back pain but it  certainly won't hurt to treat it and see.   Outpatient Encounter Medications as of 11/25/2020  Medication Sig   pregabalin (LYRICA) 25 MG capsule Take 1 capsule (25 mg total) by mouth daily for 7 days, THEN 1 capsule (25 mg total) 2 (two) times daily for 7 days, THEN 2 capsules (50 mg total) 2 (two) times daily for 16 days.   [DISCONTINUED] amoxicillin-clavulanate (AUGMENTIN) 875-125 MG tablet Take 1 tablet by mouth 2 (two) times daily for 10 days.   [DISCONTINUED] methylPREDNISolone (MEDROL DOSEPAK) 4 MG TBPK tablet tad   [DISCONTINUED] ondansetron (ZOFRAN ODT) 4 MG disintegrating tablet Take  1 tablet (4 mg total) by mouth every 8 (eight) hours as needed.   [DISCONTINUED] polyethylene glycol powder (MIRALAX) 17 GM/SCOOP powder Take 17 g by mouth daily as needed for moderate constipation.   [DISCONTINUED] tiZANidine (ZANAFLEX) 4 MG tablet Take 1-2 tablets (4-8 mg total) by mouth at bedtime.   No facility-administered encounter medications on file as of 11/25/2020.    Follow-up: Return in about 4 weeks (around 12/23/2020) for body aches follow up.   Thayer Ohm, DNP, APRN, FNP-BC [facility]

## 2021-02-05 ENCOUNTER — Encounter: Payer: Self-pay | Admitting: Medical-Surgical

## 2021-02-05 ENCOUNTER — Other Ambulatory Visit: Payer: Self-pay

## 2021-02-05 ENCOUNTER — Ambulatory Visit (INDEPENDENT_AMBULATORY_CARE_PROVIDER_SITE_OTHER): Payer: No Typology Code available for payment source | Admitting: Medical-Surgical

## 2021-02-05 VITALS — BP 111/77 | HR 90 | Resp 20 | Ht 62.0 in | Wt 152.0 lb

## 2021-02-05 DIAGNOSIS — E559 Vitamin D deficiency, unspecified: Secondary | ICD-10-CM

## 2021-02-05 DIAGNOSIS — M549 Dorsalgia, unspecified: Secondary | ICD-10-CM

## 2021-02-05 DIAGNOSIS — L299 Pruritus, unspecified: Secondary | ICD-10-CM

## 2021-02-05 DIAGNOSIS — M2550 Pain in unspecified joint: Secondary | ICD-10-CM | POA: Diagnosis not present

## 2021-02-05 DIAGNOSIS — M7918 Myalgia, other site: Secondary | ICD-10-CM

## 2021-02-05 DIAGNOSIS — R748 Abnormal levels of other serum enzymes: Secondary | ICD-10-CM | POA: Diagnosis not present

## 2021-02-05 MED ORDER — MELOXICAM 15 MG PO TABS: 15.0000 mg | ORAL_TABLET | Freq: Every day | ORAL | 0 refills | Status: DC | PRN

## 2021-02-05 NOTE — Progress Notes (Signed)
°  HPI with pertinent ROS:   CC: Myalgia follow-up  HPI: Pleasant 40 year old female presenting today for follow-up on myalgias.  She was originally seen approximately 2 months ago to establish care.  At that time, she reported a history of generalized myalgias along with fatigue.  She had previously been worked up for potential autoimmune disorders but we did not have her records available.  These records are now available in our system and show comprehensive lab work, negative for autoimmune concerns.  Today, she reports noting that her myalgias have improved overall however she does still have issues with her low back and upper thoracic area.  She did not start the Lyrica that was previously prescribed.  Notes that she got sick and then forgot to pick it up from the pharmacy.  Takes Tylenol on an as-needed basis.  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  She is doing some exercising and stretching which does seem to help.  She had a recent massage that was quite intense and left her sober for a couple of days.  Has been itching all over for the last couple of weeks.  Notes that the itch almost feels tingly but she is not sure.  Also has ringing in her right ear.  No new cosmetics, chemicals, materials, medications, foods, or environmental exposures.  Does have 1 dog who goes outside for toileting but is not exposed to potential environmental chemicals.  No recent lawn care or pesticides used in their yard.   I reviewed the past medical history, family history, social history, surgical history, and allergies today and no changes were needed.  Please see the problem list section below in epic for further details.   Physical exam:   General: Well Developed, well nourished, and in no acute distress.  Neuro: Alert and oriented x3.  HEENT: Normocephalic, atraumatic.  Skin: Warm and dry. Cardiac: Regular rate and rhythm, no murmurs  rubs or gallops, no lower extremity edema.  Respiratory: Clear to auscultation bilaterally. Not using accessory muscles, speaking in full sentences.  Impression and Recommendations:    1. Myalgia, multiple sites 2. Arthralgia, unspecified joint Somewhat improved.  She does have significant back issues at the moment but feel this is partially related to ergonomics while working from home. Recommend continuing regular exercise and incorporating stretching. Ok to use Tylenol and/or anti-inflammatories. Discussed using topical agents, heat, and ice.   3. Vitamin D deficiency Checking vitamin D - VITAMIN D 25 Hydroxy (Vit-D Deficiency, Fractures)  4. Elevated vitamin B12 level Checking B12. - Vitamin B12  5. Itching Checking labs as below. Unclear etiology. Discussed using a daily antihistamine such as Xyzal, Zyrtec, Claritin, or Allegra.  Also consider using Benadryl at night.  Unclear whether the itching is more of a nerve related issue or if it is allergic in nature. Consider using low dose Gabapentin if the above is unhelpful. Consider dry skin. Reviewed recommendations for luke warm showers followed by application of emollients.  - CBC with Differential/Platelet - COMPLETE METABOLIC PANEL WITH GFR - TSH  Return in about 3 months (around 05/06/2021) for fatigue/myalgias follow up. ___________________________________________ Thayer Ohm, DNP, APRN, FNP-BC Primary Care and Sports Medicine [facility] [place]

## 2021-02-06 LAB — VITAMIN D 25 HYDROXY (VIT D DEFICIENCY, FRACTURES): Vit D, 25-Hydroxy: 19 ng/mL — ABNORMAL LOW (ref 30–100)

## 2021-02-06 LAB — COMPLETE METABOLIC PANEL WITH GFR
AG Ratio: 1.8 (calc) (ref 1.0–2.5)
ALT: 27 U/L (ref 6–29)
AST: 19 U/L (ref 10–30)
Albumin: 4.4 g/dL (ref 3.6–5.1)
Alkaline phosphatase (APISO): 58 U/L (ref 31–125)
BUN: 9 mg/dL (ref 7–25)
CO2: 27 mmol/L (ref 20–32)
Calcium: 9.5 mg/dL (ref 8.6–10.2)
Chloride: 105 mmol/L (ref 98–110)
Creat: 0.83 mg/dL (ref 0.50–0.99)
Globulin: 2.5 g/dL (calc) (ref 1.9–3.7)
Glucose, Bld: 88 mg/dL (ref 65–99)
Potassium: 4.6 mmol/L (ref 3.5–5.3)
Sodium: 140 mmol/L (ref 135–146)
Total Bilirubin: 0.4 mg/dL (ref 0.2–1.2)
Total Protein: 6.9 g/dL (ref 6.1–8.1)
eGFR: 90 mL/min/{1.73_m2} (ref >=60)

## 2021-02-06 LAB — CBC WITH DIFFERENTIAL/PLATELET
Absolute Monocytes: 250 cells/uL (ref 200–950)
Basophils Absolute: 29 cells/uL (ref 0–200)
Basophils Relative: 0.7 %
Eosinophils Absolute: 90 cells/uL (ref 15–500)
Eosinophils Relative: 2.2 %
HCT: 41.5 % (ref 35.0–45.0)
Hemoglobin: 13.7 g/dL (ref 11.7–15.5)
Lymphs Abs: 914 cells/uL (ref 850–3900)
MCH: 28.9 pg (ref 27.0–33.0)
MCHC: 33 g/dL (ref 32.0–36.0)
MCV: 87.6 fL (ref 80.0–100.0)
MPV: 10.5 fL (ref 7.5–12.5)
Monocytes Relative: 6.1 %
Neutro Abs: 2817 cells/uL (ref 1500–7800)
Neutrophils Relative %: 68.7 %
Platelets: 213 10*3/uL (ref 140–400)
RBC: 4.74 10*6/uL (ref 3.80–5.10)
RDW: 12.2 % (ref 11.0–15.0)
Total Lymphocyte: 22.3 %
WBC: 4.1 10*3/uL (ref 3.8–10.8)

## 2021-02-06 LAB — TSH: TSH: 0.99 mIU/L

## 2021-02-06 LAB — VITAMIN B12: Vitamin B-12: 872 pg/mL (ref 200–1100)

## 2021-02-09 ENCOUNTER — Telehealth: Payer: Self-pay

## 2021-02-09 ENCOUNTER — Other Ambulatory Visit: Payer: Self-pay | Admitting: Medical-Surgical

## 2021-02-09 DIAGNOSIS — M545 Low back pain, unspecified: Secondary | ICD-10-CM

## 2021-02-09 NOTE — Telephone Encounter (Signed)
Pt called and asked for a x-ray of her back. Pt states she talked to you about this at her recent appointment 12/30. Please advise, thanks.

## 2021-02-09 NOTE — Telephone Encounter (Signed)
Xray order placed.

## 2021-02-11 ENCOUNTER — Ambulatory Visit (INDEPENDENT_AMBULATORY_CARE_PROVIDER_SITE_OTHER): Payer: No Typology Code available for payment source

## 2021-02-11 ENCOUNTER — Other Ambulatory Visit: Payer: Self-pay | Admitting: Medical-Surgical

## 2021-02-11 ENCOUNTER — Other Ambulatory Visit: Payer: Self-pay

## 2021-02-11 ENCOUNTER — Ambulatory Visit: Payer: No Typology Code available for payment source

## 2021-02-11 DIAGNOSIS — M549 Dorsalgia, unspecified: Secondary | ICD-10-CM

## 2021-02-11 DIAGNOSIS — M545 Low back pain, unspecified: Secondary | ICD-10-CM

## 2021-02-11 IMAGING — DX DG THORACIC SPINE 2V
3 series · 3 of 3 positions shown · non-contrast
Comparison: None.

CLINICAL DATA: Mid back pain.

EXAM:
THORACIC SPINE 2 VIEWS

[t-spine ap]
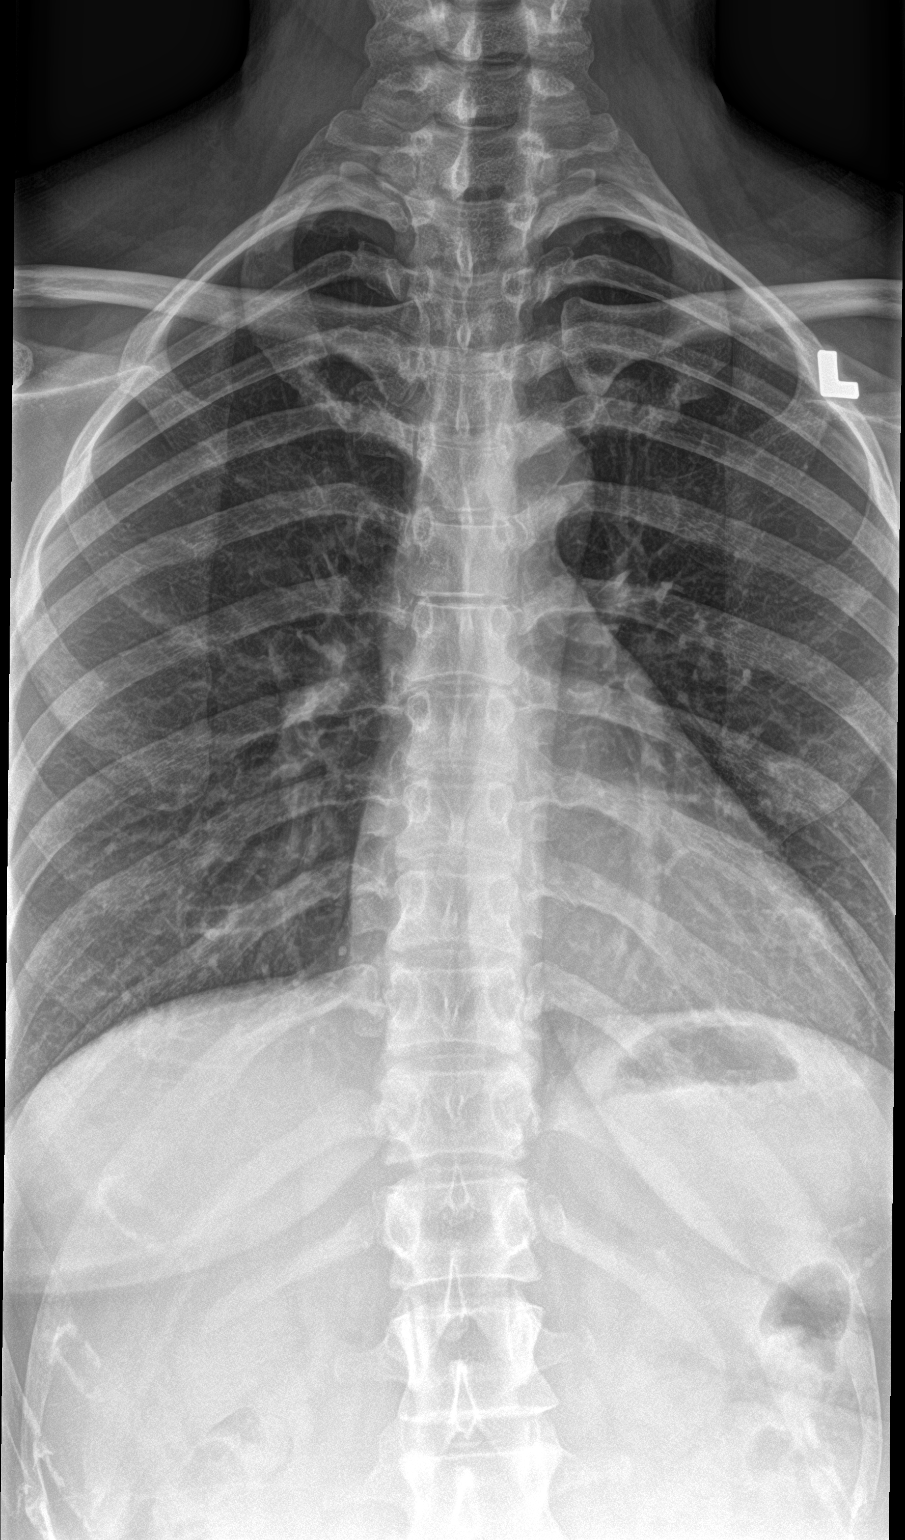

[t-spine lat]
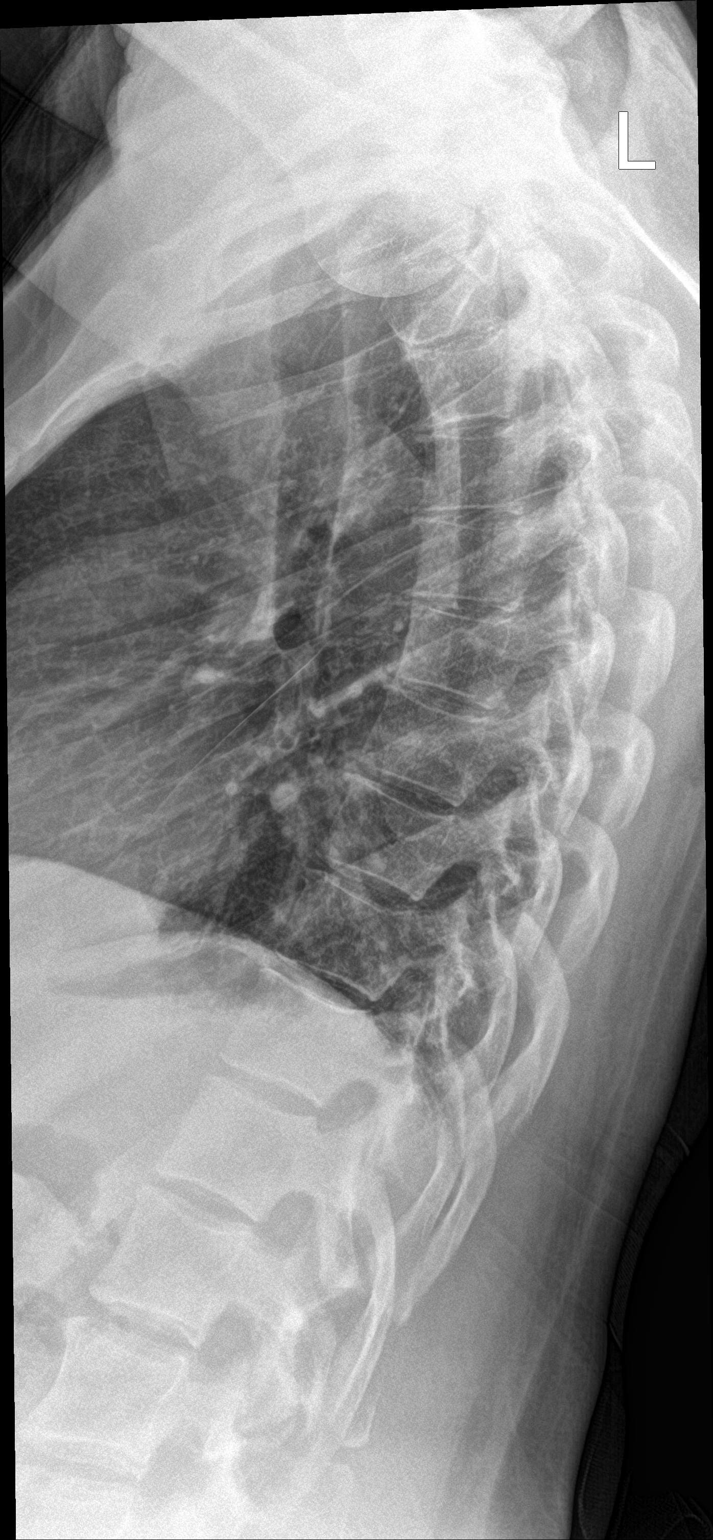

[t-spine swimmers]
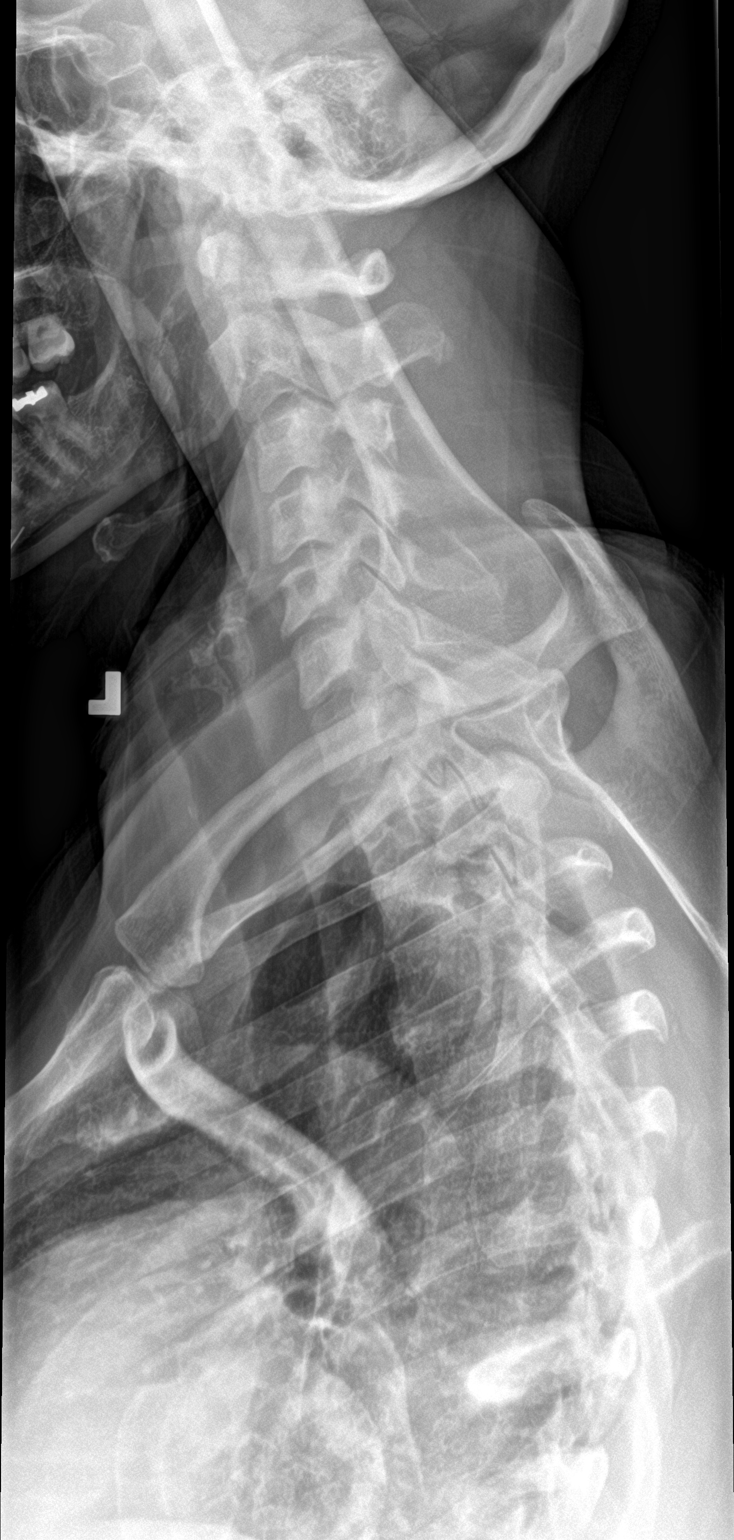

[3 of 3 positions shown; findings below may reference images not displayed]

FINDINGS: Minimal broad-based dextroscoliotic curvature of the thoracic spine.
The alignment is otherwise maintained. Vertebral body heights are
maintained. No significant disc space narrowing. No evidence of
fracture, focal bone abnormality or bone destruction. Posterior
elements appear intact. There is no paravertebral soft tissue
abnormality.
IMPRESSION: Minimal broad-based dextroscoliotic curvature of the thoracic spine.
Otherwise unremarkable radiographic appearance.

## 2021-02-11 IMAGING — DX DG LUMBAR SPINE COMPLETE 4+V
5 series · 5 of 5 positions shown · non-contrast
Comparison: None.

CLINICAL DATA: Back pain.

EXAM:
LUMBAR SPINE - COMPLETE 4+ VIEW

[l-spine ap]
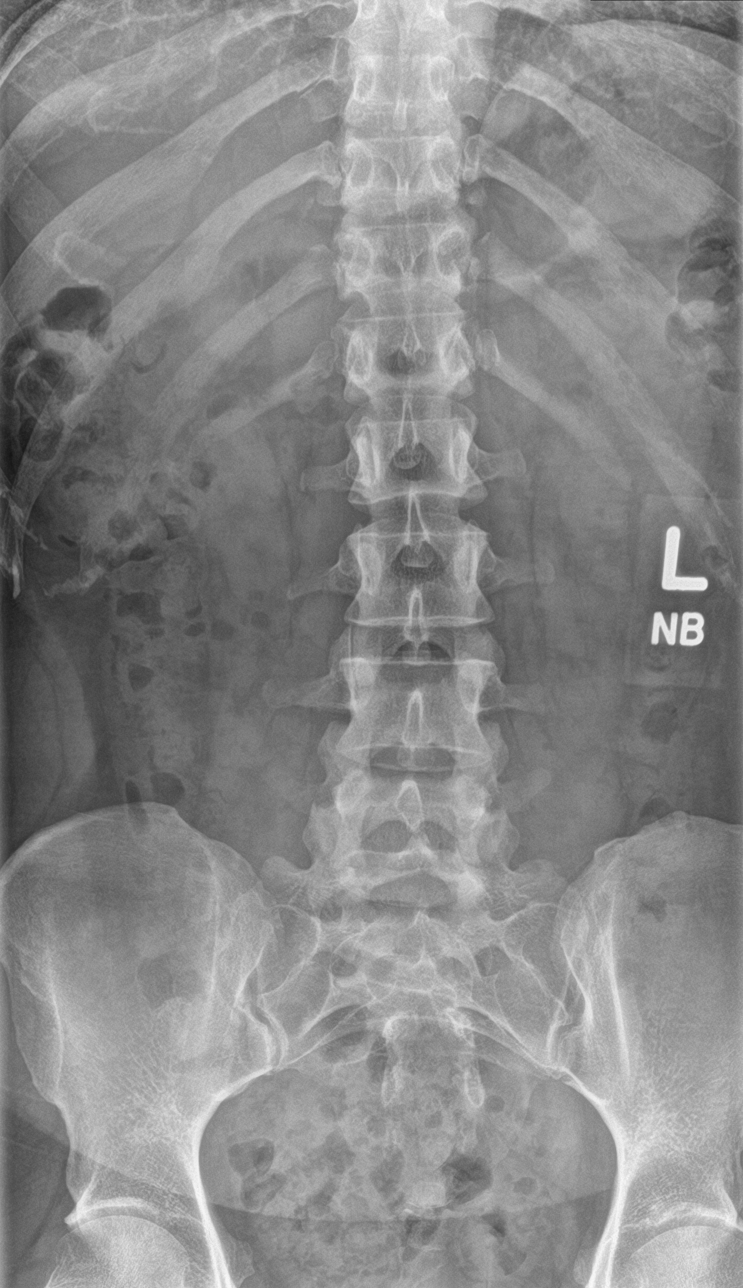

[l-spine obl (1 of 2)]
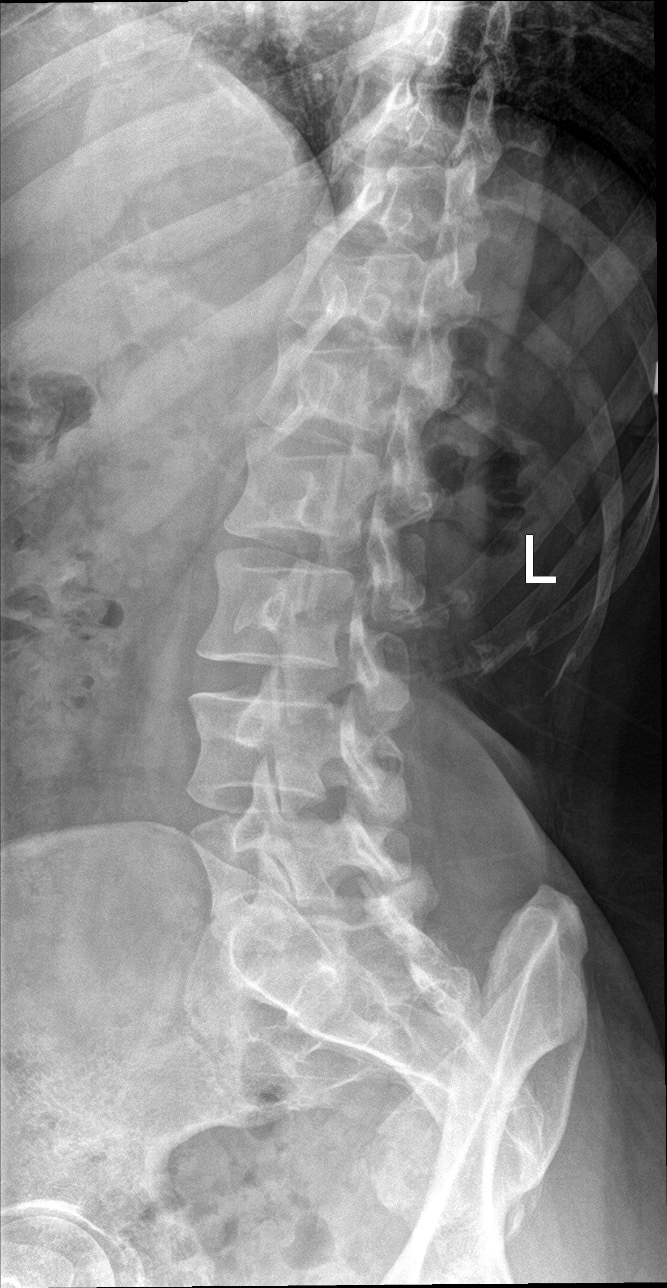

[l-spine obl (2 of 2)]
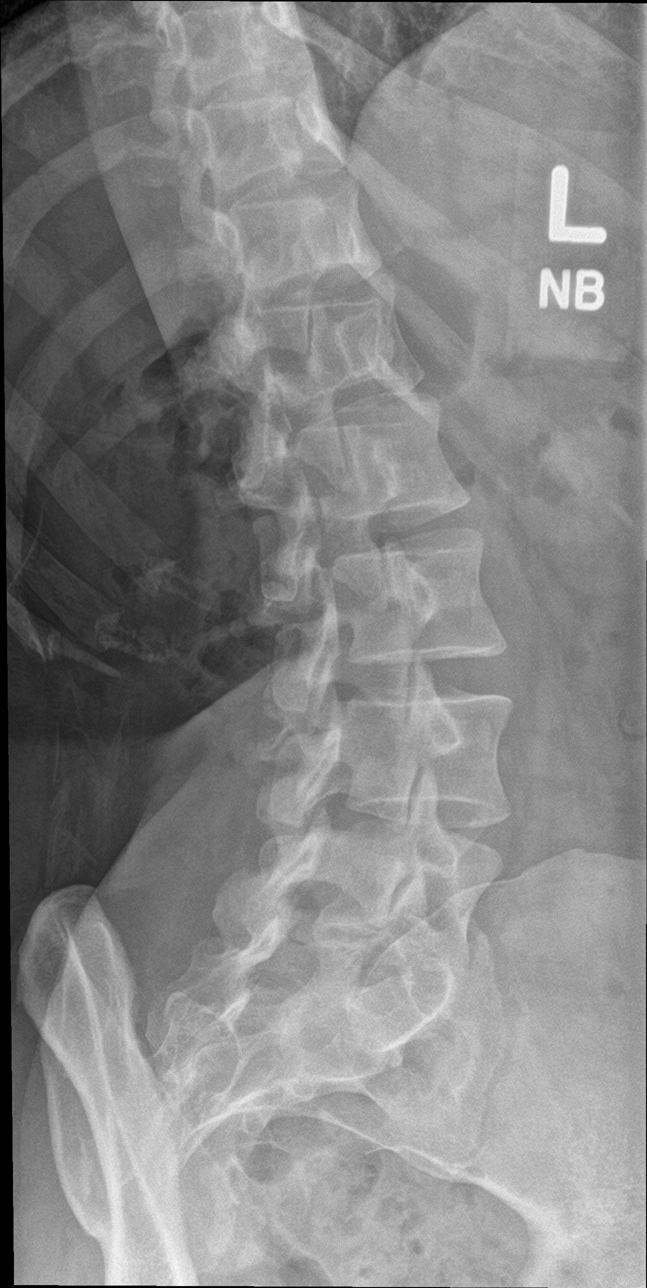

[l-spine spot]
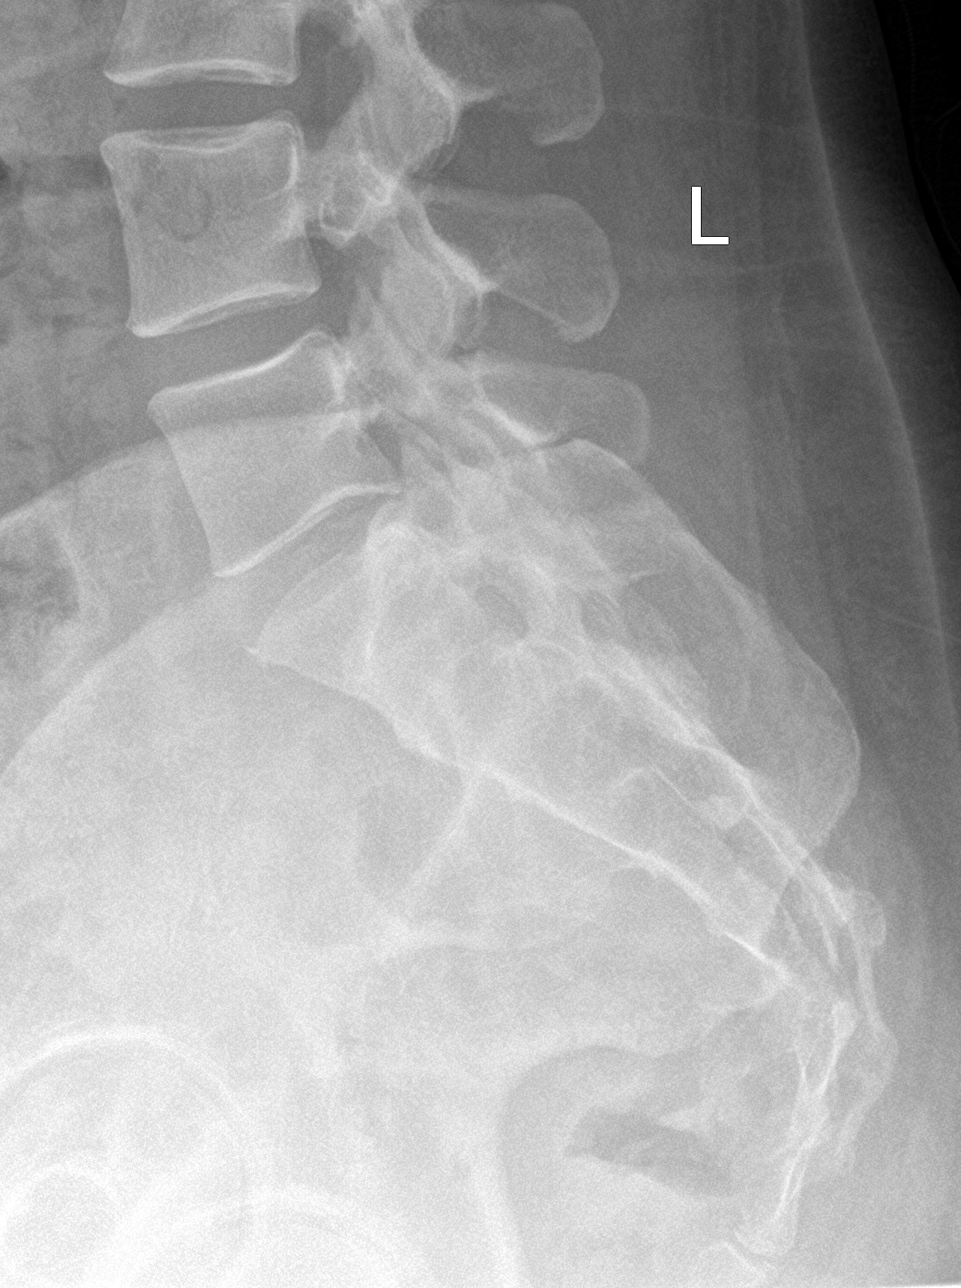

[l-spine lat]
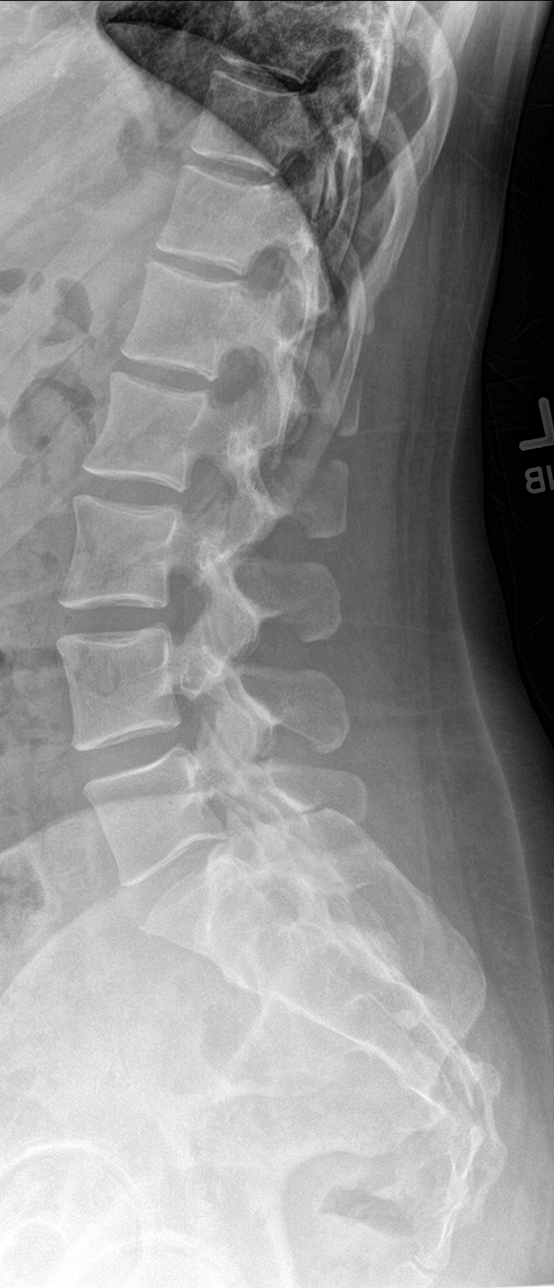

[5 of 5 positions shown; findings below may reference images not displayed]

FINDINGS: There is no evidence of lumbar spine fracture. Alignment is normal.
Intervertebral disc spaces are maintained.
IMPRESSION: Negative.

## 2021-02-11 NOTE — Addendum Note (Signed)
Addended byChristen Butter on: 02/11/2021 04:05 PM   Modules accepted: Orders

## 2021-02-22 ENCOUNTER — Ambulatory Visit
Payer: No Typology Code available for payment source | Attending: Medical-Surgical | Admitting: Rehabilitative and Restorative Service Providers"

## 2021-02-25 ENCOUNTER — Ambulatory Visit: Payer: No Typology Code available for payment source | Admitting: Physical Therapy

## 2021-03-07 ENCOUNTER — Other Ambulatory Visit: Payer: Self-pay | Admitting: Medical-Surgical

## 2021-04-23 ENCOUNTER — Other Ambulatory Visit: Payer: Self-pay

## 2021-04-23 ENCOUNTER — Ambulatory Visit (INDEPENDENT_AMBULATORY_CARE_PROVIDER_SITE_OTHER): Payer: No Typology Code available for payment source

## 2021-04-23 ENCOUNTER — Encounter: Payer: Self-pay | Admitting: Medical-Surgical

## 2021-04-23 ENCOUNTER — Ambulatory Visit (INDEPENDENT_AMBULATORY_CARE_PROVIDER_SITE_OTHER): Payer: No Typology Code available for payment source | Admitting: Medical-Surgical

## 2021-04-23 VITALS — BP 118/86 | HR 101 | Resp 20 | Ht 62.0 in | Wt 148.7 lb

## 2021-04-23 DIAGNOSIS — R0781 Pleurodynia: Secondary | ICD-10-CM | POA: Diagnosis not present

## 2021-04-23 DIAGNOSIS — F419 Anxiety disorder, unspecified: Secondary | ICD-10-CM

## 2021-04-23 DIAGNOSIS — J019 Acute sinusitis, unspecified: Secondary | ICD-10-CM

## 2021-04-23 DIAGNOSIS — Z86711 Personal history of pulmonary embolism: Secondary | ICD-10-CM

## 2021-04-23 MED ORDER — AZITHROMYCIN 250 MG PO TABS: ORAL_TABLET | ORAL | 0 refills | Status: AC

## 2021-04-23 MED ORDER — SERTRALINE HCL 50 MG PO TABS: ORAL_TABLET | ORAL | 3 refills | Status: DC

## 2021-04-23 NOTE — Progress Notes (Signed)
?  HPI with pertinent ROS:  ? ?CC: back pain, SOB ? ?HPI: ?Pleasant 41 year old female presenting today for the following: ? ?For the past week she has been experiencing sinus congestion with PND and a globus sensation. She has also had a frontal headache daily for the past week. A couple of times she has had periods of shortness of breath that came on suddenly. This got better with sitting up and taking deep, slow breaths for several minutes. Has also noted a nagging discomfort along the posterior left chest at the mid thoracic level most notable when taking a deep breath. The area is not tender to touch or aggravated by movements. No recent injury or trauma to the area.  ? ?Is planning to go on a trip soon where she will be flying there and back. She has a history of a PE requiring anticoagulation for 6 months. Also notes having several abdominal blood clots as well. Wonders if she should get some compression socks for the flight and if there is anything else she should do to help prevent blood clots.  ? ?Has been more anxious lately and thinks it may be time to go back on something to help manage this. Has been going through a lot lately and feels a bit stressed out. Previously took Sertraline 100mg  daily but this left her feeling sleepy/groggy. Thinks she might like to restart the medication but at a lower dose. Denies SI/HI.  ? ?I reviewed the past medical history, family history, social history, surgical history, and allergies today and no changes were needed.  Please see the problem list section below in epic for further details. ? ? ?Physical exam:  ? ?General: Well Developed, well nourished, and in no acute distress.  ?Neuro: Alert and oriented x3.  ?HEENT: Normocephalic, atraumatic, pupils equal round reactive to light, neck supple, no masses, no lymphadenopathy, thyroid nonpalpable. + PND, - maxillary/frontal facial pain ?Skin: Warm and dry. ?Cardiac: Regular rate and rhythm, no murmurs rubs or gallops, no  lower extremity edema.  ?Respiratory: Clear to auscultation bilaterally. Not using accessory muscles, speaking in full sentences. ? ?Impression and Recommendations:   ? ?1. Acute non-recurrent sinusitis, unspecified location ?After 1 week of symptoms and with her upcoming flight, we will go ahead and treat with Azithromycin 500mg  today followed by 250mg  daily for the next 4 days. Increase PO fluids. Consider decongestants or Mucinex.  ? ?2. Pleuritic chest pain ?Chest x-ray. Low suspicion for pneumonia or costochondritis. Lungs CTA. No MSK tenderness on exam. ?- DG Chest 2 View; Future ? ?3. History of pulmonary embolism ?Recommend compression socks. Letter written to provide to the regarding the need for frequent position changes and ambulation to prevent blood clots while on her trip.  ? ?4. Anxiety ?Start sertraline 25mg  daily x 8 days then increase to 50mg  daily.  ? ?Return for follow up as scheduled. ?___________________________________________ ? , DNP, APRN, FNP-BC ?Primary Care and Sports Medicine ?[facility] ?

## 2021-05-06 ENCOUNTER — Ambulatory Visit (INDEPENDENT_AMBULATORY_CARE_PROVIDER_SITE_OTHER): Payer: Self-pay | Admitting: Medical-Surgical

## 2021-05-06 DIAGNOSIS — Z91199 Patient's noncompliance with other medical treatment and regimen due to unspecified reason: Secondary | ICD-10-CM

## 2021-05-06 NOTE — Progress Notes (Signed)
   Complete physical exam  Patient: Michele Cohen   DOB: 11/27/2000   41 y.o. Female  MRN: [id number]  Subjective:    No chief complaint on file.   Michele Cohen is a 41 y.o. female who presents today for a complete physical exam. She reports consuming a {diet types:17450} diet. {types:19826} She generally feels {DESC; WELL/FAIRLY WELL/POORLY:18703}. She reports sleeping {DESC; WELL/FAIRLY WELL/POORLY:18703}. She {does/does not:200015} have additional problems to discuss today.    Most recent fall risk assessment:    08/04/2021   10:42 AM  Fall Risk   Falls in the past year? 0  Number falls in past yr: 0  Injury with Fall? 0  Risk for fall due to : No Fall Risks  Follow up Falls evaluation completed     Most recent depression screenings:    08/04/2021   10:42 AM 06/25/2020   10:46 AM  PHQ 2/9 Scores  PHQ - 2 Score 0 0  PHQ- 9 Score 5     {VISON DENTAL STD PSA (Optional):27386}  {History (Optional):23778}  Patient Care Team: Jenalee Trevizo, NP as PCP - General (Nurse Practitioner)   Outpatient Medications Prior to Visit  Medication Sig   fluticasone (FLONASE) 50 MCG/ACT nasal spray Place 2 sprays into both nostrils in the morning and at bedtime. After 7 days, reduce to once daily.   norgestimate-ethinyl estradiol (SPRINTEC 28) 0.25-35 MG-MCG tablet Take 1 tablet by mouth daily.   Nystatin POWD Apply liberally to affected area 2 times per day   spironolactone (ALDACTONE) 100 MG tablet Take 1 tablet (100 mg total) by mouth daily.   No facility-administered medications prior to visit.    ROS        Objective:     There were no vitals taken for this visit. {Vitals History (Optional):23777}  Physical Exam   No results found for any visits on 09/09/21. {Show previous labs (optional):23779}    Assessment & Plan:    Routine Health Maintenance and Physical Exam  Immunization History  Administered Date(s) Administered   DTaP 02/10/1999, 04/08/1999,  06/17/1999, 03/02/2000, 09/16/2003   Hepatitis A 07/13/2007, 07/18/2008   Hepatitis B 11/28/1998, 01/05/1999, 06/17/1999   HiB (PRP-OMP) 02/10/1999, 04/08/1999, 06/17/1999, 03/02/2000   IPV 02/10/1999, 04/08/1999, 12/06/1999, 09/16/2003   Influenza,inj,Quad PF,6+ Mos 10/18/2013   Influenza-Unspecified 01/18/2012   MMR 12/05/2000, 09/16/2003   Meningococcal Polysaccharide 07/18/2011   Pneumococcal Conjugate-13 03/02/2000   Pneumococcal-Unspecified 06/17/1999, 08/31/1999   Tdap 07/18/2011   Varicella 12/06/1999, 07/13/2007    Health Maintenance  Topic Date Due   HIV Screening  Never done   Hepatitis C Screening  Never done   INFLUENZA VACCINE  09/07/2021   PAP-Cervical Cytology Screening  09/09/2021 (Originally 11/27/2019)   PAP SMEAR-Modifier  09/09/2021 (Originally 11/27/2019)   TETANUS/TDAP  09/09/2021 (Originally 07/17/2021)   HPV VACCINES  Discontinued   COVID-19 Vaccine  Discontinued    Discussed health benefits of physical activity, and encouraged her to engage in regular exercise appropriate for her age and condition.  Problem List Items Addressed This Visit   None Visit Diagnoses     Annual physical exam    -  Primary   Cervical cancer screening       Need for Tdap vaccination          No follow-ups on file.     Shellye Zandi, NP   

## 2021-05-10 ENCOUNTER — Other Ambulatory Visit: Payer: Self-pay | Admitting: General Surgery

## 2021-05-10 DIAGNOSIS — Z803 Family history of malignant neoplasm of breast: Secondary | ICD-10-CM

## 2021-05-16 ENCOUNTER — Other Ambulatory Visit: Payer: Self-pay | Admitting: Medical-Surgical

## 2021-06-07 HISTORY — PX: BREAST BIOPSY: SHX20

## 2021-06-15 ENCOUNTER — Ambulatory Visit
Admission: RE | Admit: 2021-06-15 | Discharge: 2021-06-15 | Disposition: A | Discharge status: Home / Self Care | Payer: No Typology Code available for payment source | Source: Ambulatory Visit | Attending: General Surgery | Admitting: General Surgery

## 2021-06-15 DIAGNOSIS — Z803 Family history of malignant neoplasm of breast: Secondary | ICD-10-CM

## 2021-06-15 IMAGING — MR MR BREAST BILAT WO/W CM
13 of 14 series · 32 of 48 positions shown · IV contrast (gadavist)
Comparison: None Available.

CLINICAL DATA: High-risk screening MRI. Strong family history of
breast cancer.

EXAM:
BILATERAL BREAST MRI WITH AND WITHOUT CONTRAST
TECHNIQUE: Multiplanar, multisequence MR images of both breasts were obtained
prior to and following the intravenous administration of 6 ml of
Gadavist

[Series 2: t2_tirm_tra ipat (a-p) · axial · 3.0mm · 0.70mm/px · 1 of 47 slices shown]
[im 1/47]
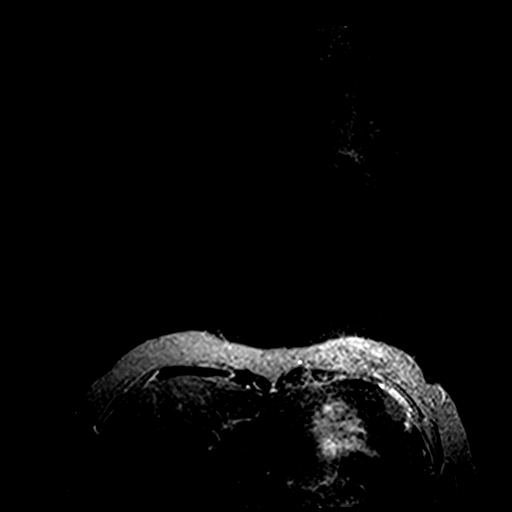

[Series 3: fl3d pre-cm no · axial · non-contrast · 1.2mm · 0.89mm/px · z∈[-45,+79]mm · 2 of 104 slices shown]
[im 1/104]
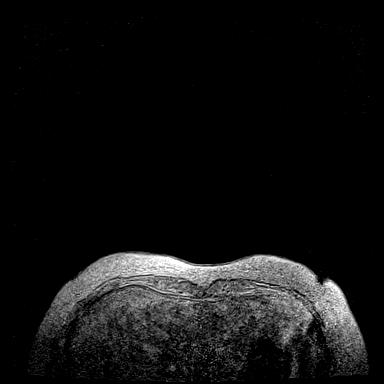
[im 104/104]
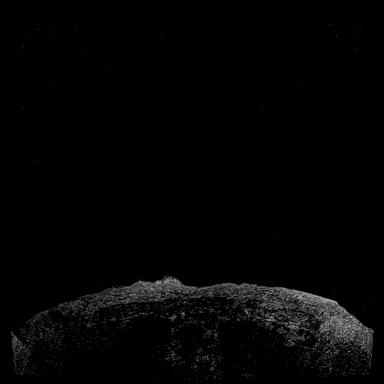

[Series 4: fl3d pre-cm · axial · non-contrast · 1.2mm · 0.89mm/px · z∈[-45,+79]mm · 3 of 104 slices shown]
[im 1/104]
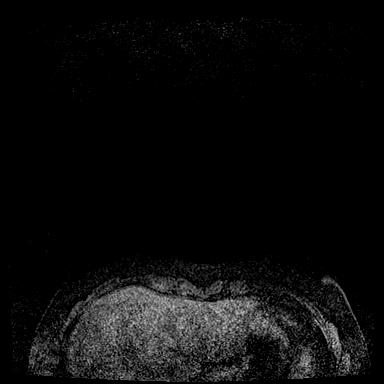
[im 52/104]
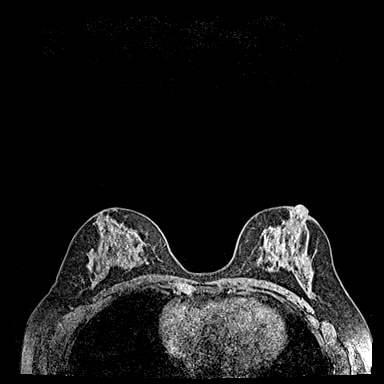
[im 104/104]
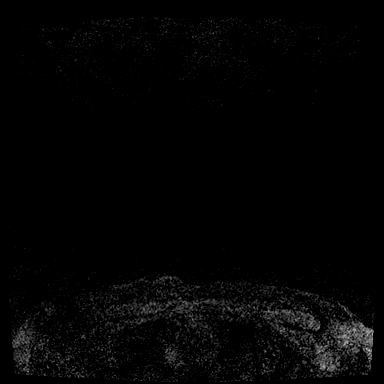

[Series 5: fl3d post-cm 20 · axial · 1.2mm · 0.89mm/px · z∈[-45,+79]mm · 3 of 104 slices shown (1 of 3)]
[im 1/104]
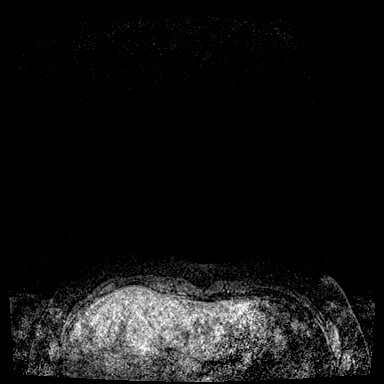
[im 52/104]
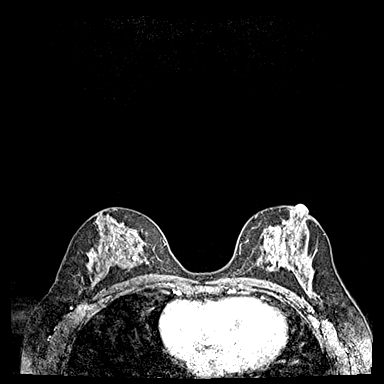
[im 104/104]
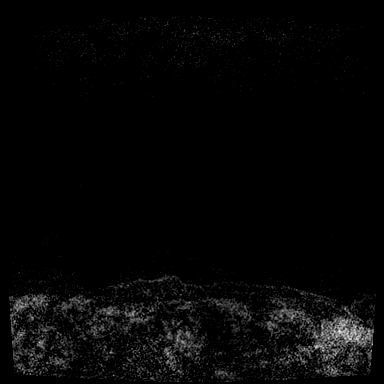

[Series 6: fl3d post-cm 20 · axial · 1.2mm · 0.89mm/px · z∈[-45,+79]mm · 3 of 104 slices shown (2 of 3)]
[im 1/104]
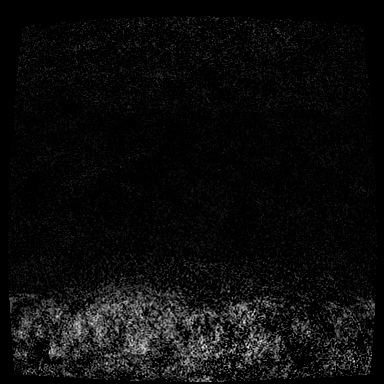
[im 52/104]
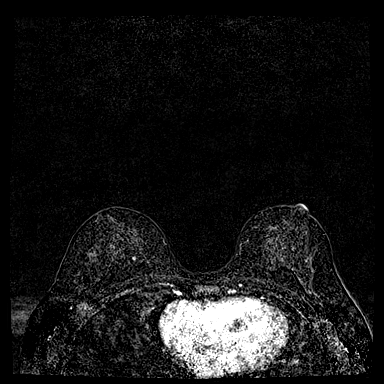
[im 104/104]
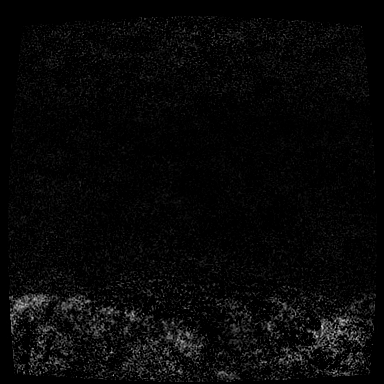

[Series 7: fl3d post-cm 20 · axial · 124.8mm · 0.89mm/px · 1 of 1 slices shown (3 of 3)]
[im 1/1]
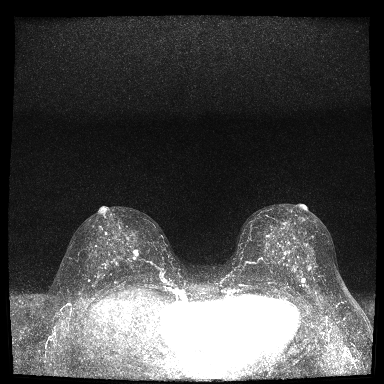

[Series 8: fl3d post-cm 3 · axial · 1.2mm · 0.89mm/px · z∈[-45,+79]mm · 3 of 104 slices shown (1 of 3)]
[im 1/104]
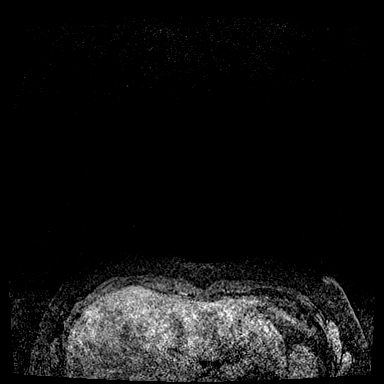
[im 52/104]
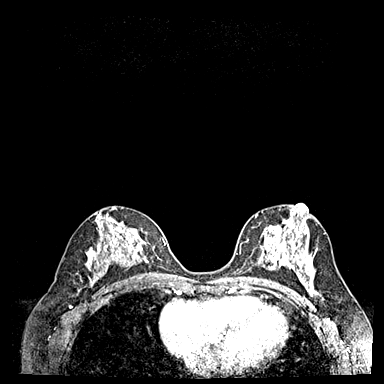
[im 104/104]
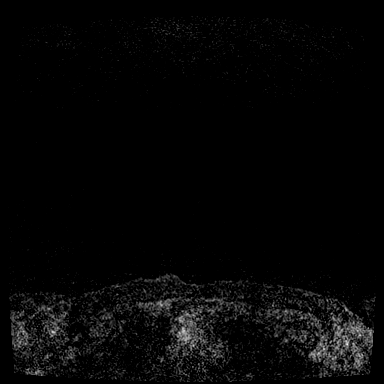

[Series 9: fl3d post-cm 3 · axial · 1.2mm · 0.89mm/px · z∈[-45,+79]mm · 3 of 104 slices shown (2 of 3)]
[im 1/104]
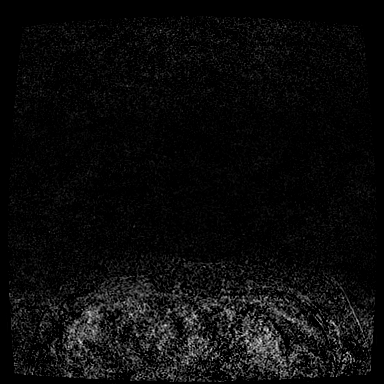
[im 52/104]
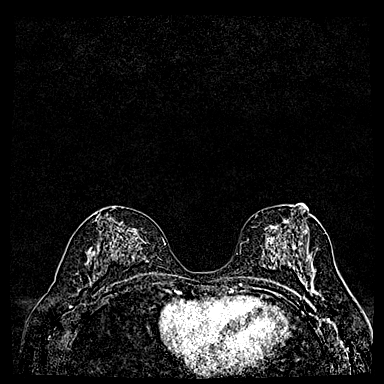
[im 104/104]
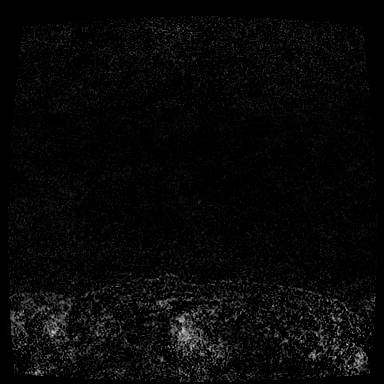

[Series 10: fl3d post-cm 3 · axial · 124.8mm · 0.89mm/px · 1 of 1 slices shown (3 of 3)]
[im 1/1]
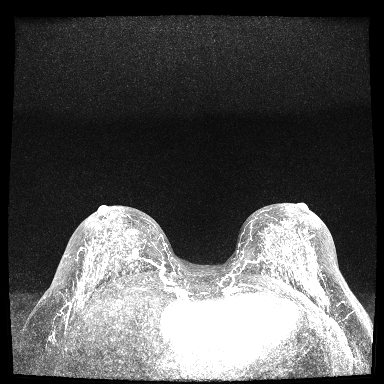

[Series 11: fl3d post-cm 5 · axial · 1.2mm · 0.89mm/px · z∈[-45,+79]mm · 3 of 104 slices shown (1 of 3)]
[im 1/104]
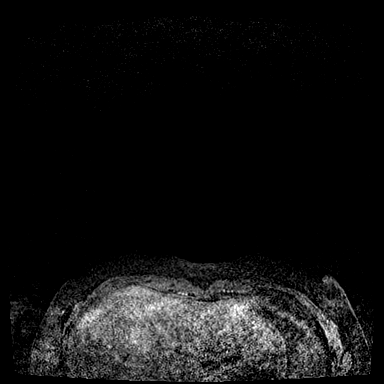
[im 52/104]
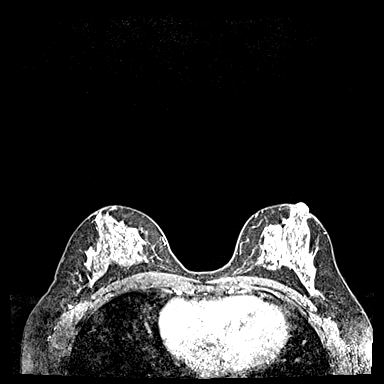
[im 104/104]
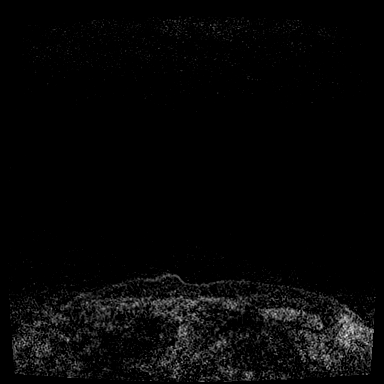

[Series 12: fl3d post-cm 5 · axial · 1.2mm · 0.89mm/px · z∈[-45,+79]mm · 3 of 104 slices shown (2 of 3)]
[im 1/104]
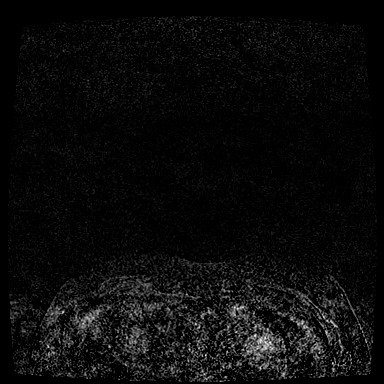
[im 52/104]
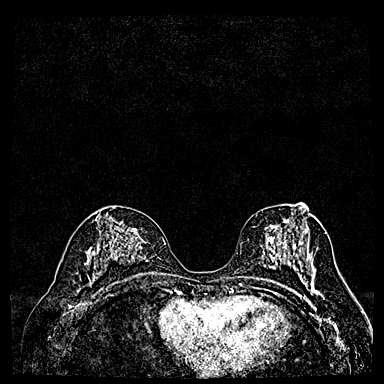
[im 104/104]
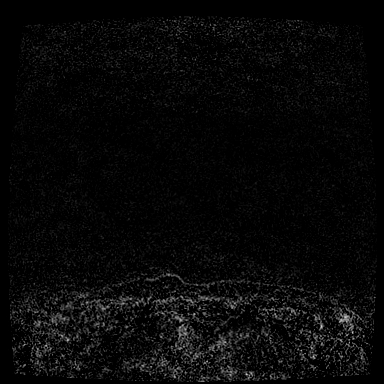

[Series 13: fl3d post-cm 5 · axial · 124.8mm · 0.89mm/px · 1 of 1 slices shown (3 of 3)]
[im 1/1]
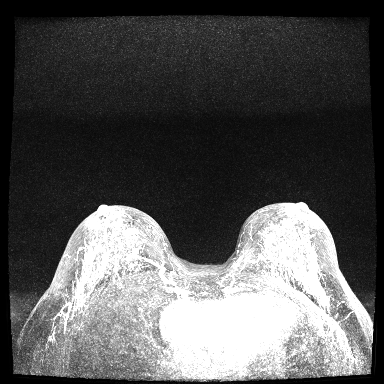

[(id) pre-cm · axial · non-contrast · 1.2mm · 0.89mm/px · z∈[-45,+23]mm · 5 of 416 slices shown]
[im 1/416]
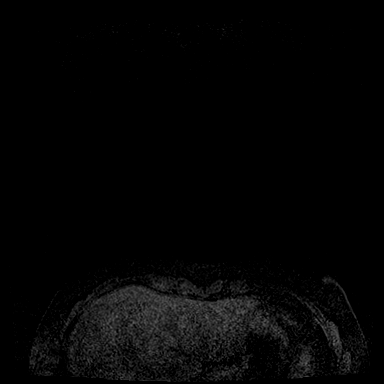
[im 76/416]
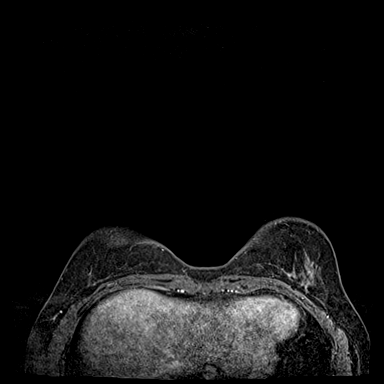
[im 114/416]
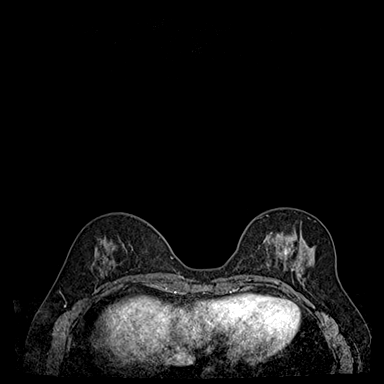
[im 189/416]
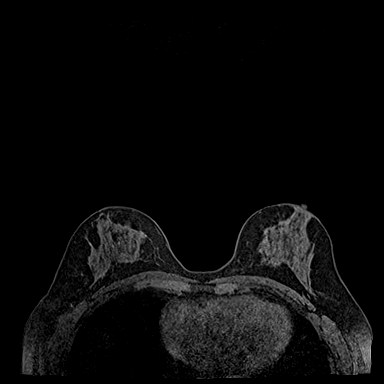
[im 227/416]
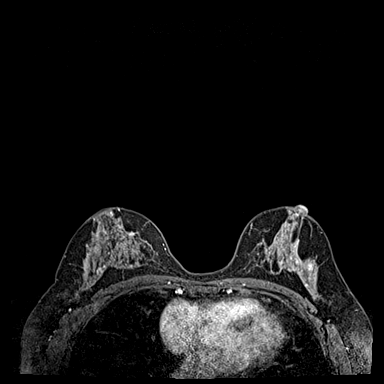

[32 of 48 positions shown; findings below may reference images not displayed]

Three-dimensional MR images were rendered by post-processing of the
original MR data on an independent workstation. The
three-dimensional MR images were interpreted, and findings are
reported in the following complete MRI report for this study. Three
dimensional images were evaluated at the independent interpreting
workstation using the DynaCAD thin client.
FINDINGS: Breast composition: c. Heterogeneous fibroglandular tissue.

Background parenchymal enhancement: Moderate.

Right breast: There is an indeterminate enhancing 6 mm mass in the
lower-inner quadrant of the right breast (series 6, image 70). There
are 2 smaller similar enhancing masses measuring 2 mm (series 6,
image 52) and 4 mm (series 6, image 59).

Left breast: There is an indeterminate enhancing 6 mm mass in the
posterior third of the lower outer quadrant of the left breast
(series 6, image 83).

Lymph nodes: No abnormal appearing lymph nodes.

Ancillary findings:  None.
IMPRESSION: Indeterminate 6 mm enhancing masses in the lower-inner quadrant of
the right breast (series 6, image 70) and in the lower outer
quadrant of the left breast (series 6, image 83). 2 additional
smaller indeterminate enhancing masses in the lower-inner quadrant
of the right breast are identified.

RECOMMENDATION:
MR guided core biopsies of the enhancing 6 mm masses in the
lower-inner quadrant of the right (series 6, image 70) breast and
lower outer quadrant of the left breast (series 6, image 83) is
recommended. If both masses are benign I would recommend short-term
interval follow-up MRI to ensure stability of the 2 additional
smaller masses in the lower-inner quadrant of the right breast. If
either biopsy is positive for malignancy MR guided core biopsy of
the 2 additional enhancing masses in the right breast would be
suggested.

BI-RADS CATEGORY  4: Suspicious.

## 2021-06-15 MED ORDER — GADOBUTROL 1 MMOL/ML IV SOLN
6.0000 mL | Freq: Once | INTRAVENOUS | Status: AC | PRN
  Administered 2021-06-15: 6 mL via INTRAVENOUS

## 2021-06-17 ENCOUNTER — Ambulatory Visit: Payer: No Typology Code available for payment source | Admitting: Medical-Surgical

## 2021-06-17 ENCOUNTER — Other Ambulatory Visit: Payer: Self-pay | Admitting: General Surgery

## 2021-06-17 DIAGNOSIS — R9389 Abnormal findings on diagnostic imaging of other specified body structures: Secondary | ICD-10-CM

## 2021-06-23 ENCOUNTER — Ambulatory Visit (INDEPENDENT_AMBULATORY_CARE_PROVIDER_SITE_OTHER): Payer: No Typology Code available for payment source | Admitting: Medical-Surgical

## 2021-06-23 ENCOUNTER — Encounter: Payer: Self-pay | Admitting: Medical-Surgical

## 2021-06-23 VITALS — BP 106/75 | HR 93 | Resp 20 | Ht 62.0 in | Wt 148.4 lb

## 2021-06-23 DIAGNOSIS — F419 Anxiety disorder, unspecified: Secondary | ICD-10-CM | POA: Insufficient documentation

## 2021-06-23 MED ORDER — TRIAZOLAM 0.25 MG PO TABS: ORAL_TABLET | ORAL | 0 refills | Status: DC

## 2021-06-23 NOTE — Progress Notes (Signed)
? ?Established Patient Office Visit ? ?Subjective   ?Patient ID: Michele Cohen, female    DOB: 25-Nov-1980  Age: 41 y.o. MRN: [id number] ? ?Chief Complaint  ?Patient presents with  ? Follow-up  ? ? ?HPI ?Pleasant 41 year old female presenting today for follow-up on mood.  She has been taking Zoloft 50 mg daily, tolerating well without side effects.  Notes that her symptoms are very well controlled however she does still have some fatigue.  Her fatigue is not constant and only happens approximately 3 days/week.  When she does feel fatigued, she notes that around 930 to 10:00 in the morning she just feels like she has to lay her head down and close her eyes.  Has not been able to find anything that truly contributes.  Sleep is restorative and she does not have any history or suspicion for sleep apnea. ? ?Having a biopsy on both breasts on Friday and is requesting something for preprocedure anxiolysis. ? ?Review of Systems  ?Constitutional:  Positive for malaise/fatigue. Negative for chills and fever.  ?Respiratory:  Negative for cough, shortness of breath and wheezing.   ?Cardiovascular:  Negative for chest pain, palpitations and leg swelling.  ?Neurological:  Negative for dizziness and headaches.  ?Psychiatric/Behavioral:  Negative for depression and suicidal ideas. The patient is not nervous/anxious and does not have insomnia.   ? ?  ?Objective:  ?  ? ?BP 106/75 (BP Location: Right Arm, Cuff Size: Normal)   Pulse 93   Resp 20   Ht 5\' 2"  (1.575 m)   Wt 148 lb 6.4 oz (67.3 kg)   LMP 12/15/2011   SpO2 96%   BMI 27.14 kg/m?  ? ? ?Physical Exam ?Vitals reviewed.  ?Constitutional:   ?   General: She is not in acute distress. ?   Appearance: Normal appearance.  ?HENT:  ?   Head: Normocephalic and atraumatic.  ?Cardiovascular:  ?   Rate and Rhythm: Normal rate and regular rhythm.  ?   Pulses: Normal pulses.  ?   Heart sounds: Normal heart sounds. No murmur heard. ?  No friction rub. No gallop.  ?Pulmonary:  ?    Effort: Pulmonary effort is normal. No respiratory distress.  ?   Breath sounds: Normal breath sounds. No wheezing.  ?Skin: ?   General: Skin is warm and dry.  ?Neurological:  ?   Mental Status: She is alert and oriented to person, place, and time.  ?Psychiatric:     ?   Mood and Affect: Mood normal.     ?   Behavior: Behavior normal.     ?   Thought Content: Thought content normal.     ?   Judgment: Judgment normal.  ? ?No results found for any visits on 06/23/21. ? ? ? ?The ASCVD Risk score (Arnett DK, et al., 2019) failed to calculate for the following reasons: ?  Cannot find a previous HDL lab ?  Cannot find a previous total cholesterol lab ? ?  ?Assessment & Plan:  ? ?Problem List Items Addressed This Visit   ? ?  ? Other  ? Anxiety - Primary  ?  Very stable today.  PHQ-9 score of 2 with GAD-7 score of 0.  Since this is well controlled, continue sertraline 50 mg daily.  Do not feel that the medication is contributing since the fatigue is not occurring every day.  Patient agreeable to the plan.  Discussed possibly adding Wellbutrin to address intermittent fatigue.  She would like to think  about this.  Sending in triazolam for preprocedural anxiety management for her biopsy.  Discussed that she should have a driver while taking this medication.  She states her cousin is going to take her. ? ?  ?  ? ?Return in about 6 months (around 12/24/2021) for mood follow up.  ? ?___________________________________________ ?Thayer Ohm, DNP, APRN, FNP-BC ?Primary Care and Sports Medicine ?[facility] ? ? ?

## 2021-06-23 NOTE — Assessment & Plan Note (Signed)
Very stable today.  PHQ-9 score of 2 with GAD-7 score of 0.  Since this is well controlled, continue sertraline 50 mg daily.  Do not feel that the medication is contributing since the fatigue is not occurring every day.  Patient agreeable to the plan.  Discussed possibly adding Wellbutrin to address intermittent fatigue.  She would like to think about this.  Sending in triazolam for preprocedural anxiety management for her biopsy.  Discussed that she should have a driver while taking this medication.  She states her cousin is going to take her. ?

## 2021-06-25 ENCOUNTER — Ambulatory Visit
Admission: RE | Admit: 2021-06-25 | Discharge: 2021-06-25 | Disposition: A | Discharge status: Home / Self Care | Payer: No Typology Code available for payment source | Source: Ambulatory Visit | Attending: General Surgery | Admitting: General Surgery

## 2021-06-25 ENCOUNTER — Other Ambulatory Visit: Payer: Self-pay | Admitting: General Surgery

## 2021-06-25 DIAGNOSIS — R9389 Abnormal findings on diagnostic imaging of other specified body structures: Secondary | ICD-10-CM

## 2021-06-25 IMAGING — MG MM BREAST LOCALIZATION CLIP
6 of 10 series · 6 of 30 positions shown · non-contrast
Comparison: Previous exam(s).

CLINICAL DATA: Status post MR guided core biopsy LEFT breast.

EXAM:
3D DIAGNOSTIC LEFT MAMMOGRAM POST MRI BIOPSY

[L CC synth-2D (1 of 4)]
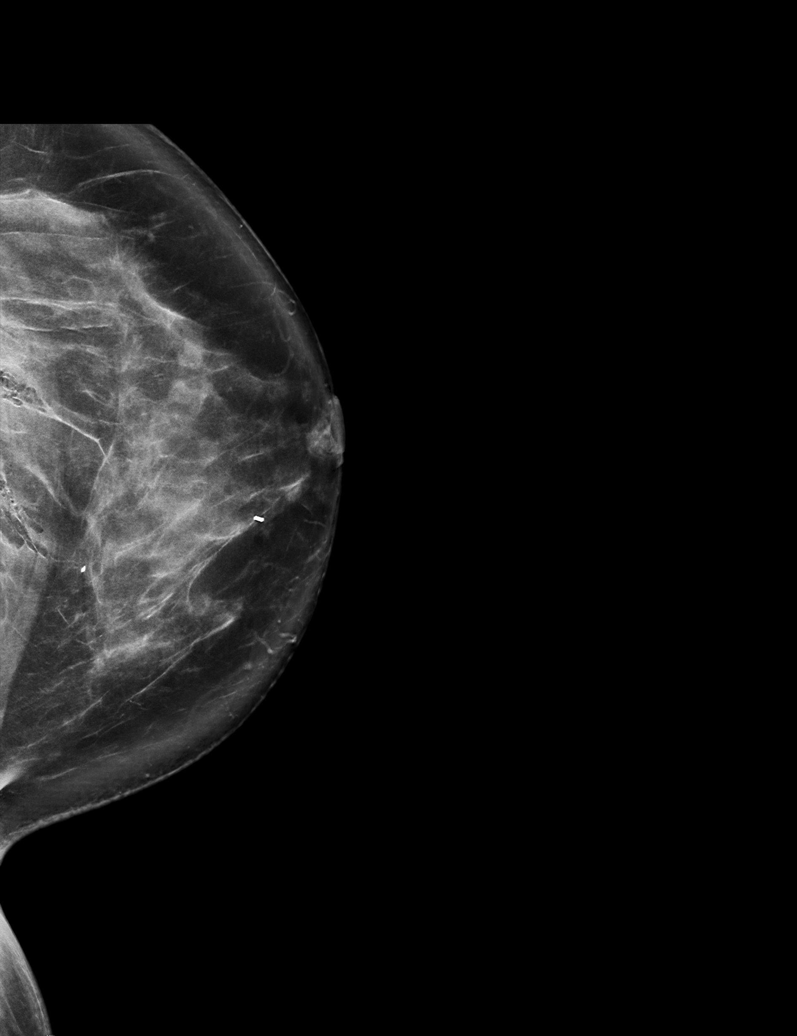

[L CC synth-2D (2 of 4)]
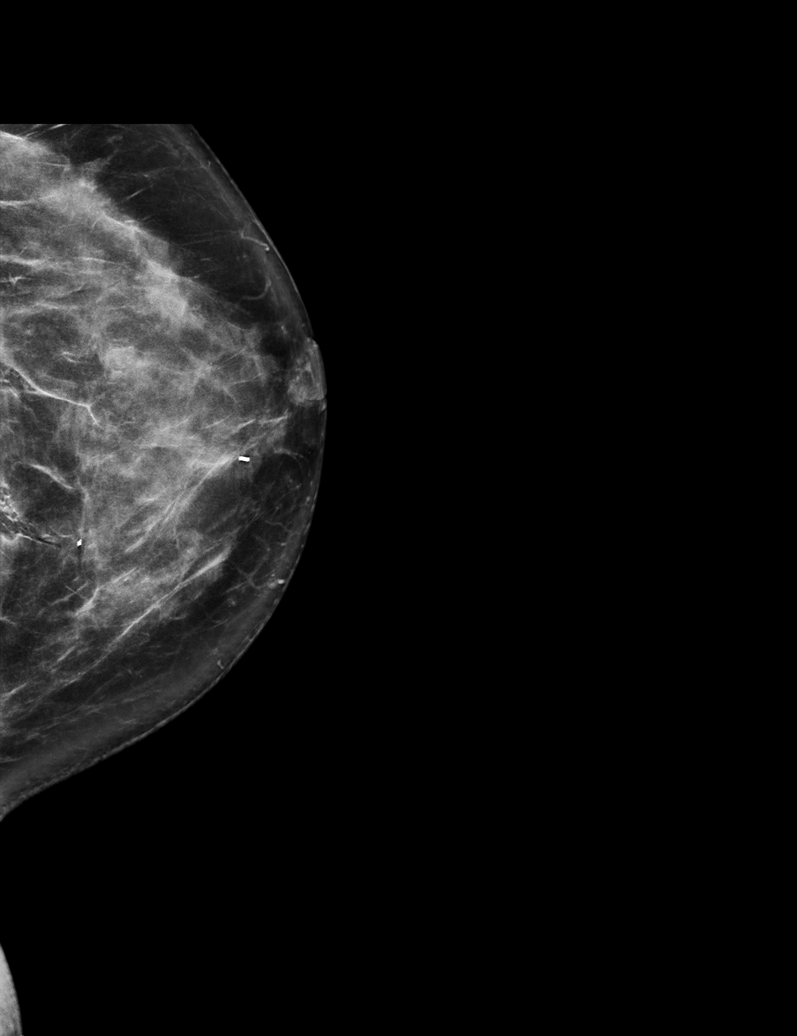

[L CC synth-2D (3 of 4)]
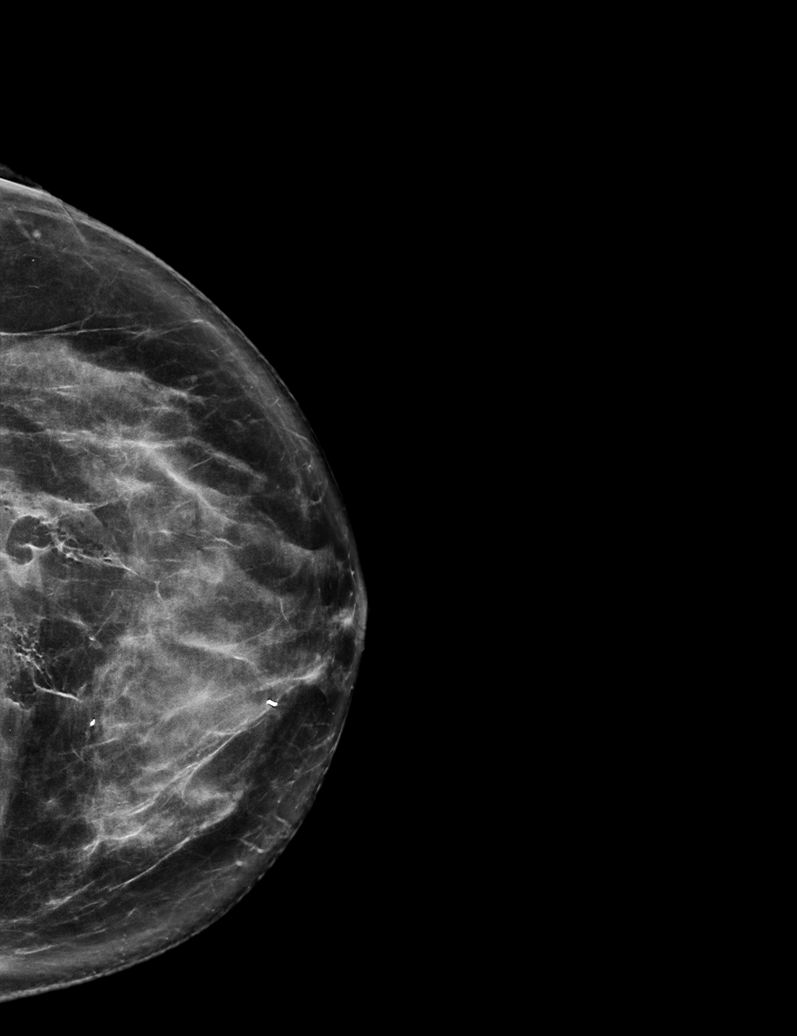

[L ML synth-2D]
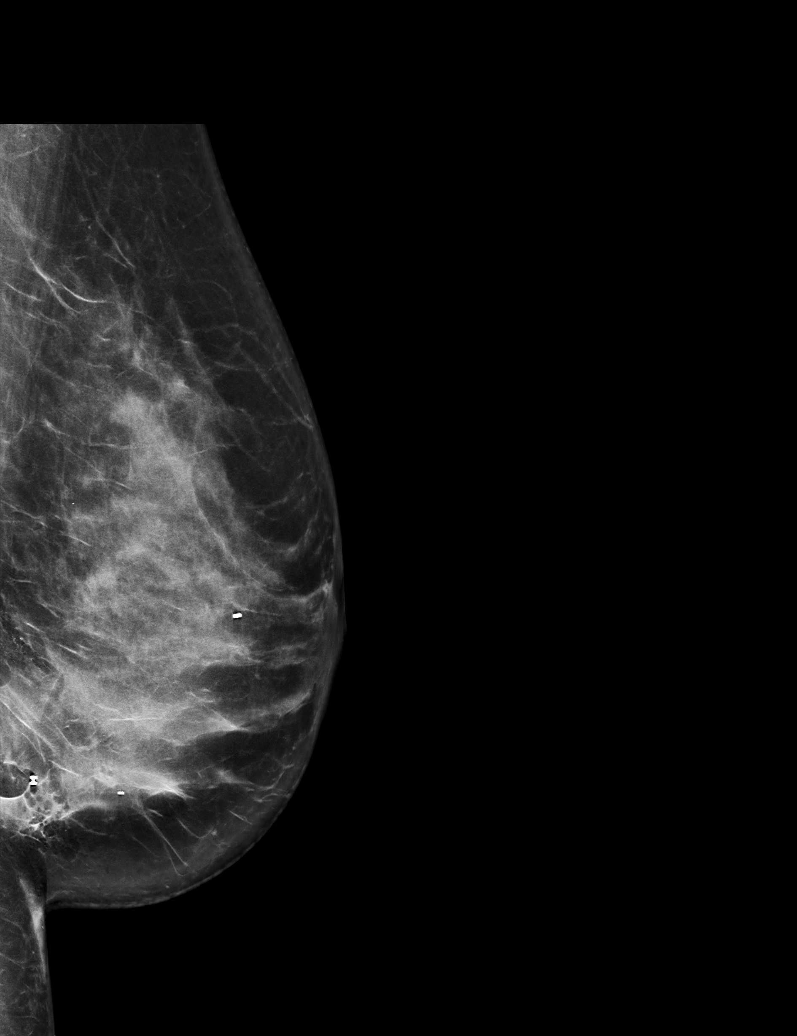

[L CC synth-2D (4 of 4)]
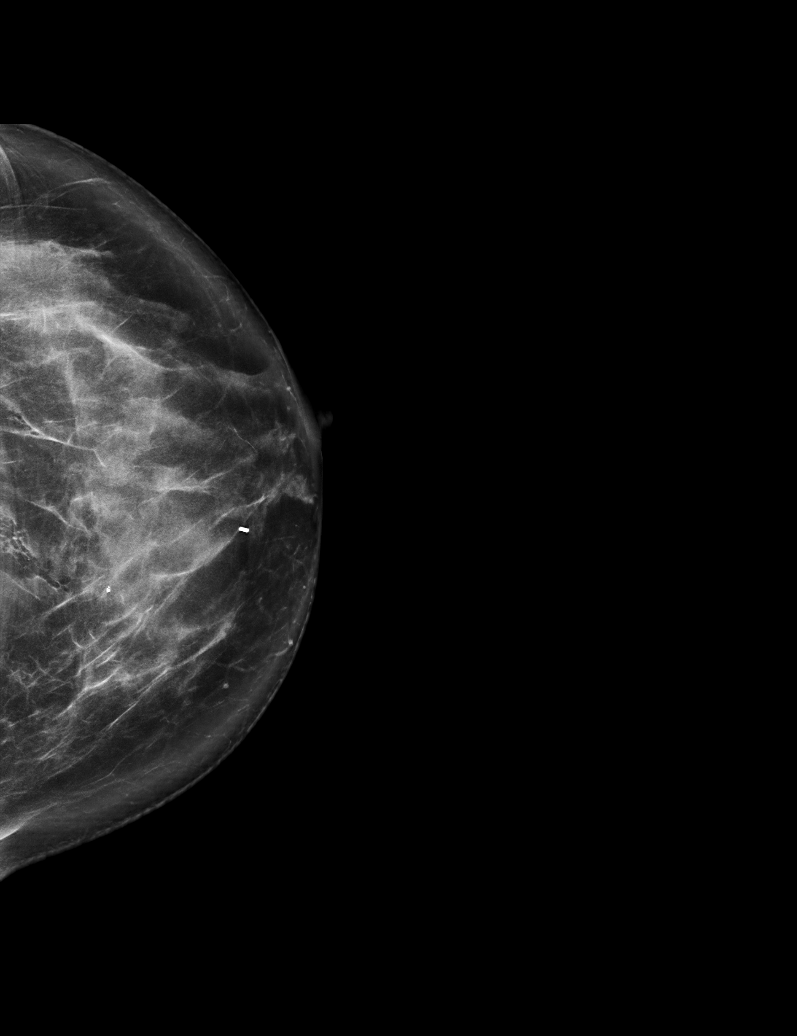

[L CC tomo · tomo slice 37/72.0]
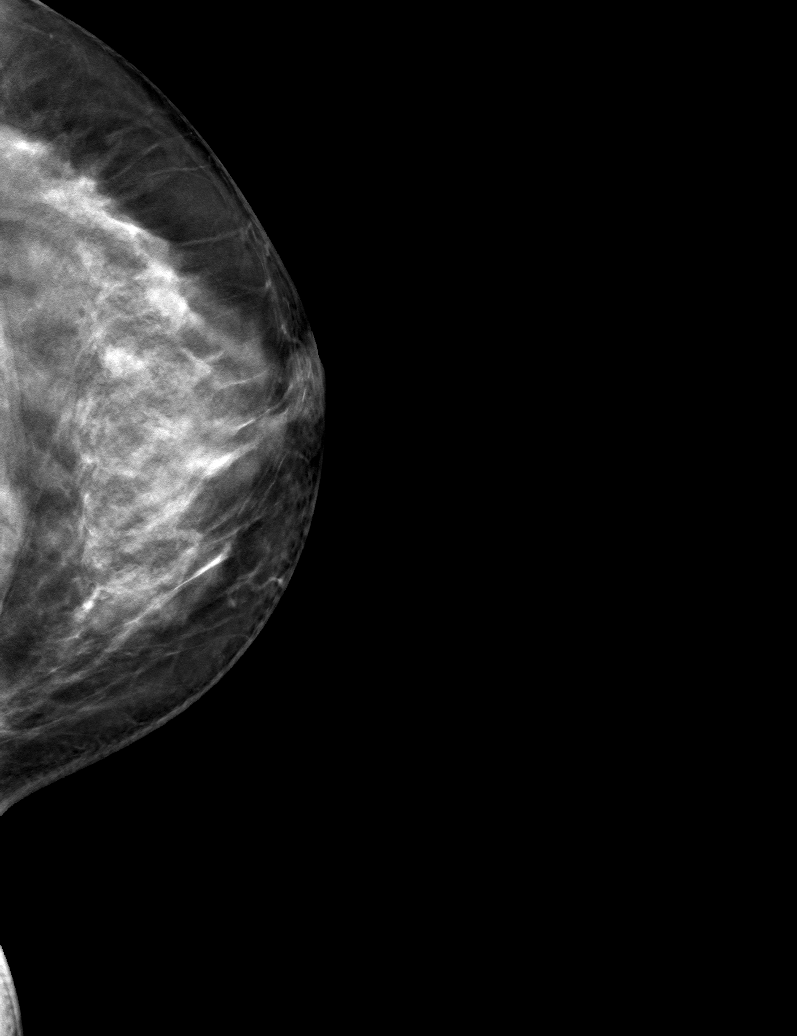

[6 of 30 positions shown; findings below may reference images not displayed]

FINDINGS: 3D Mammographic images were obtained following MRI guided biopsy of
enhancing mass in the posterior LOWER OUTER QUADRANT of the LEFT
breast and placement of a barbell shaped clip. The biopsy marking
clip is in expected position at the site of biopsy, only seen in the
true LATERAL projection.
IMPRESSION: Appropriate positioning of the barbell shaped biopsy marking clip at
the site of biopsy in the posterior LOWER OUTER QUADRANT LEFT
breast.

Final Assessment: Post Procedure Mammograms for Marker Placement

## 2021-06-25 IMAGING — MG MM BREAST LOCALIZATION CLIP
4 series · 4 of 12 positions shown · non-contrast
Comparison: Previous exam(s).

CLINICAL DATA: Status post MR guided core biopsy of 2 sites in the
RIGHT breast.

EXAM:
3D DIAGNOSTIC RIGHT MAMMOGRAM POST MRI BIOPSY x2

[R ML synth-2D]
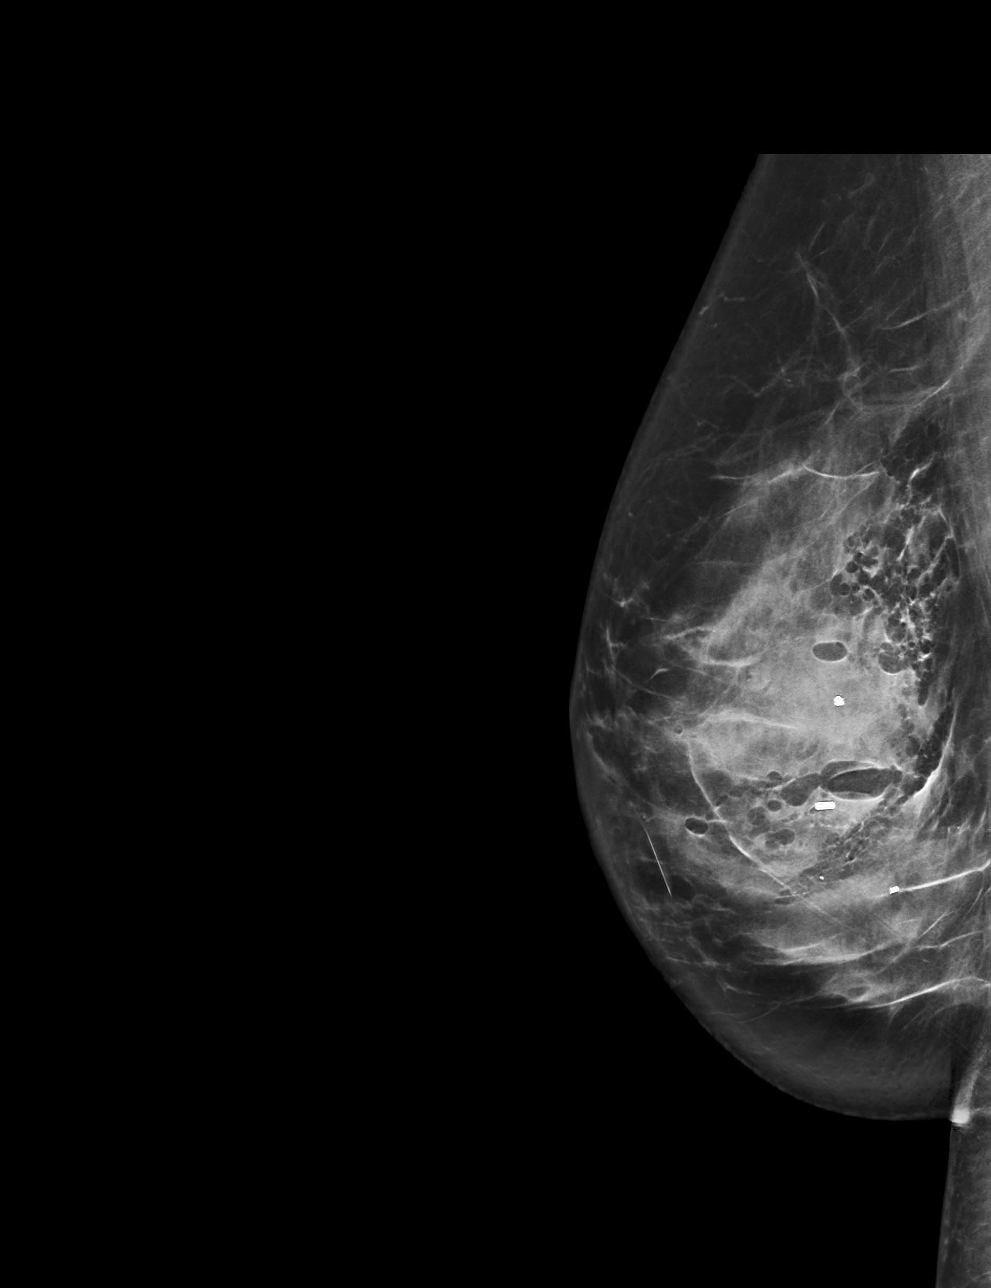

[R CC synth-2D]
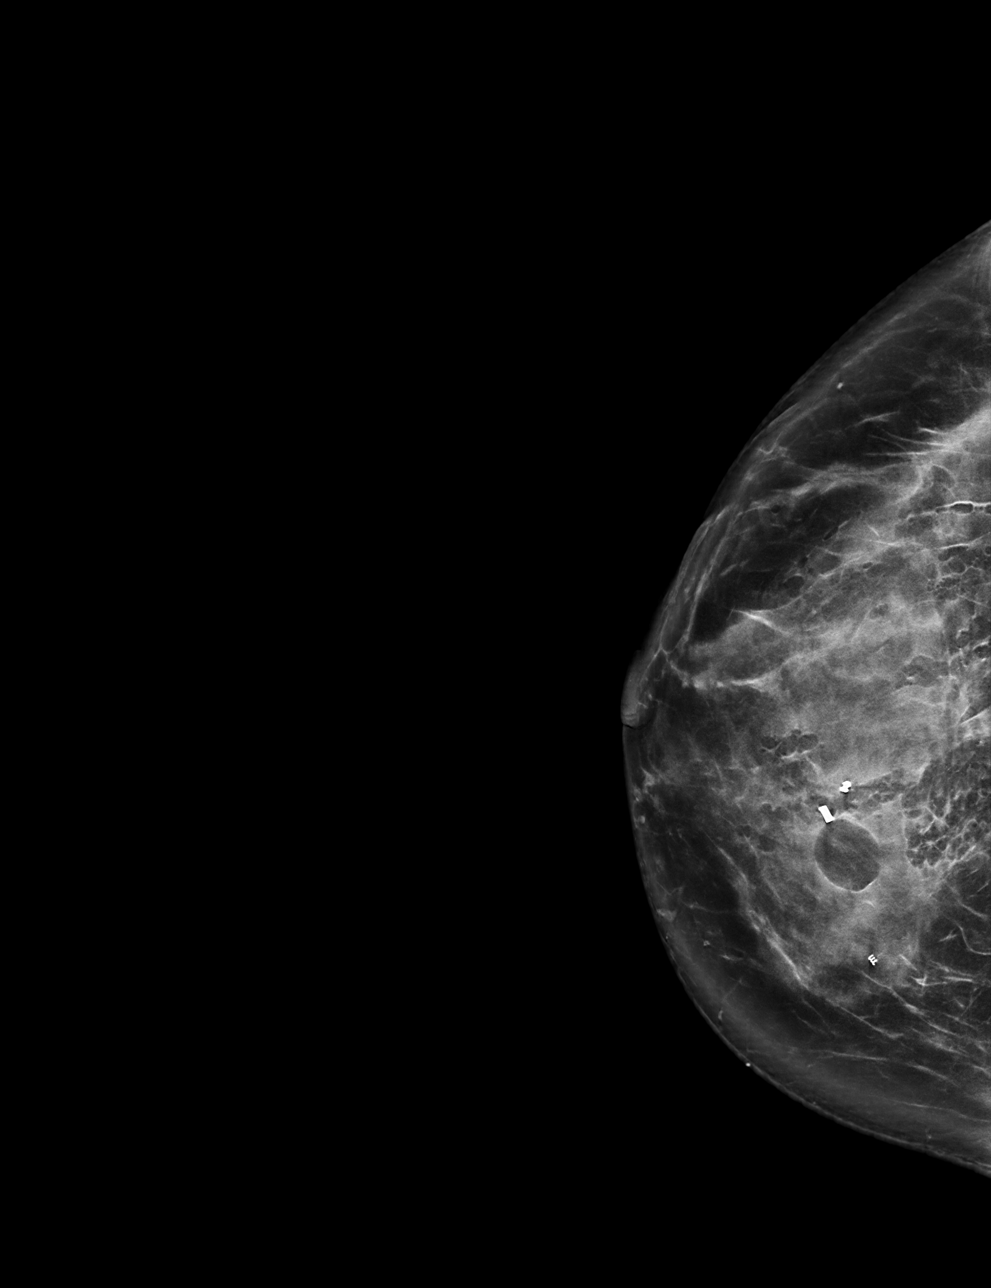

[R ML tomo · tomo slice 39/78.0]
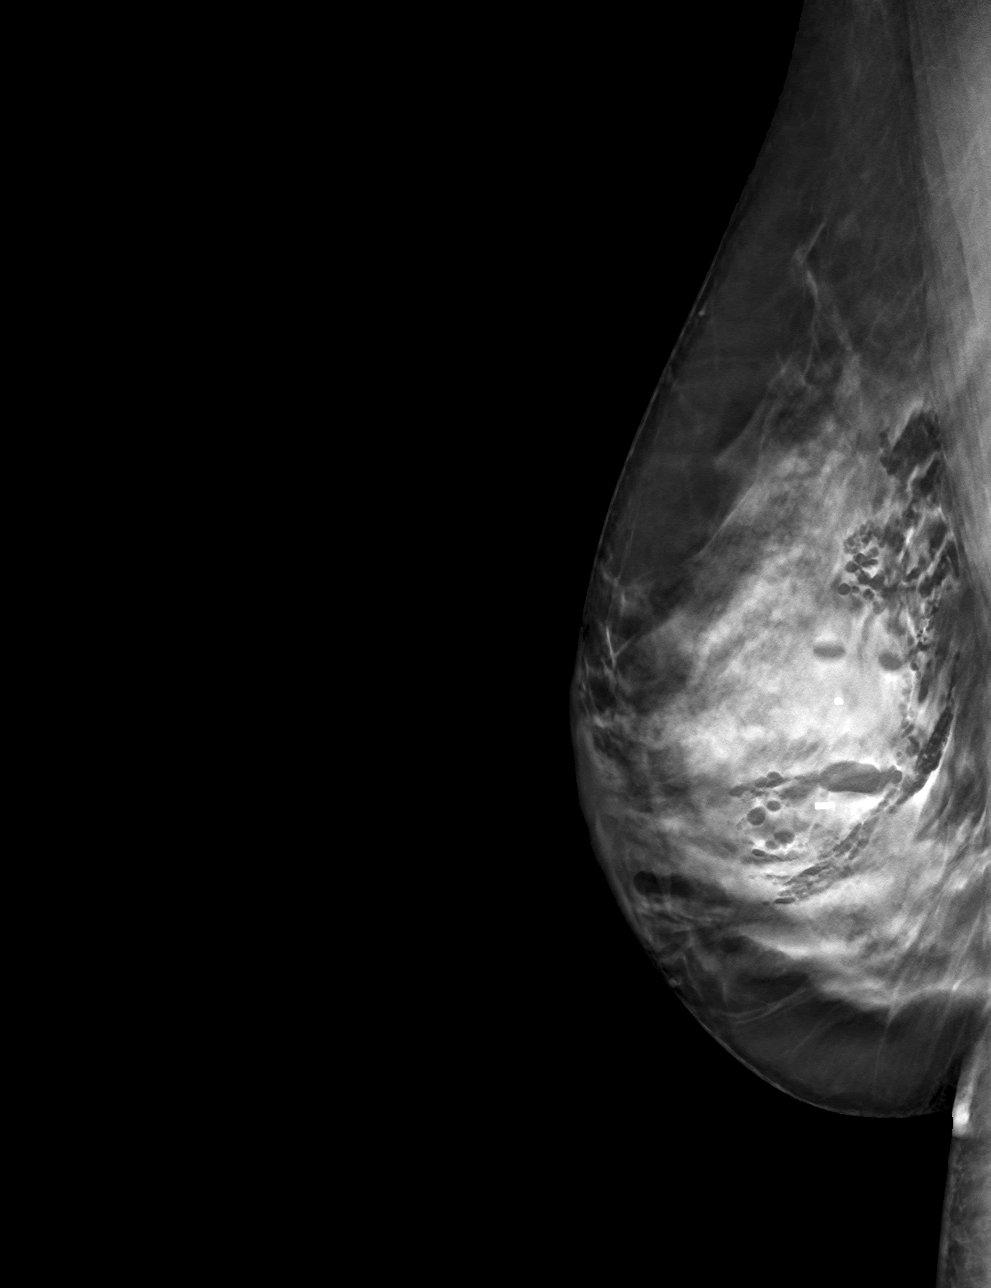

[R CC tomo · tomo slice 39/77.0]
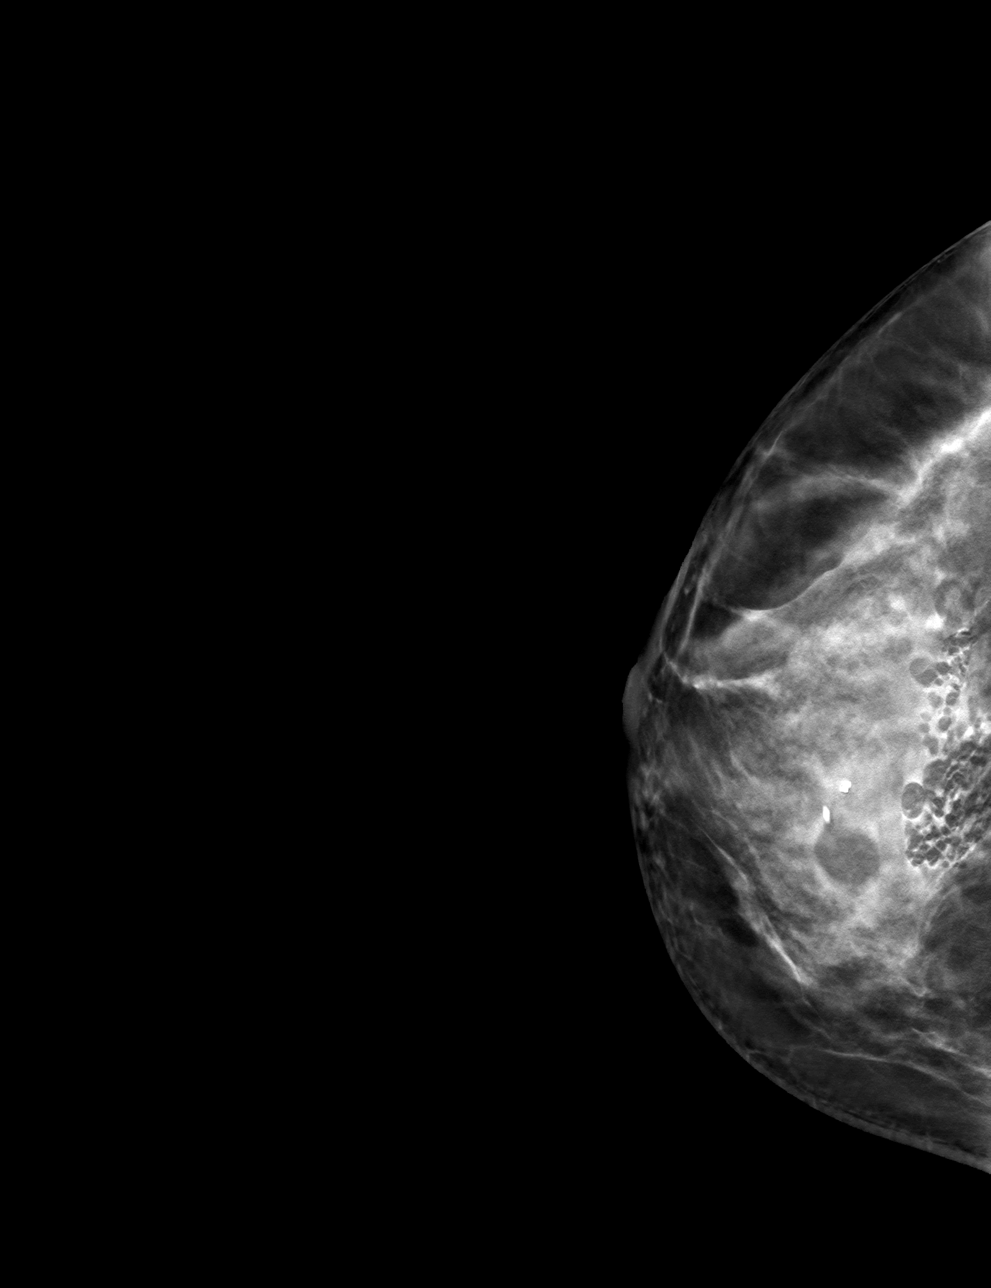

[4 of 12 positions shown; findings below may reference images not displayed]

FINDINGS: 3D Mammographic images were obtained following MRI guided biopsy of
enhancing mass in the superior LOWER INNER QUADRANT of the RIGHT
breast and placement of a barbell clip. The biopsy marking clip is
in expected position at the site of biopsy.

Following biopsy of enhancement in the inferior LOWER INNER QUADRANT
of the RIGHT breast, cylinder-shaped clip was placed in is
identified in expected location.
IMPRESSION: Tissue marker clips are in the expected locations after biopsy.

Final Assessment: Post Procedure Mammograms for Marker Placement

## 2021-06-25 IMAGING — MR MR BREAST BX W LOC DEV 1ST LESION IMAGE BX SPEC MR GUIDE*L*
7 of 10 series · 29 of 48 positions shown · IV contrast (6 ml gadavist)
Comparison: Prior studies, most recent [DATE]
COMPARISON: Prior studies, most recent [DATE]

Addendum:
CLINICAL DATA: Patient presents for MR guided core biopsy of RIGHT
and LEFT breast. On recent MRI exam, patient has 3 enhancing masses
in the LOWER INNER QUADRANT of the RIGHT breast and a single
enhancing mass in the LOWER OUTER QUADRANT of the LEFT breast.
Family history of breast cancer. Patient's mother is positive for
the BRCA gene.

EXAM:
MR GUIDED CORE NEEDLE BIOPSY OF THE LEFT BREAST
MRI GUIDED CORE NEEDLE BIOPSY OF THE RIGHT BREAST x2
TECHNIQUE: Multiplanar, multisequence MR imaging of BOTH BREASTS was performed
both before and after administration of intravenous contrast.
CONTRAST:  6 ml of Gadavist

[Series 2: fiducial bilateral · sagittal · 2.0mm · 1.33mm/px · 4 of 160 slices shown]
[im 1/160]
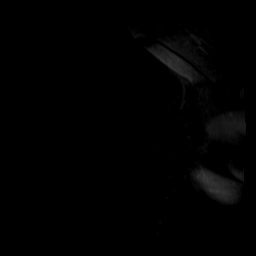
[im 54/160]
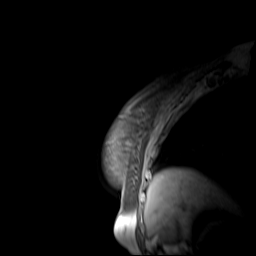
[im 107/160]
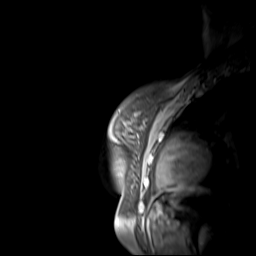
[im 160/160]
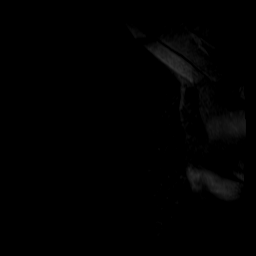

[Series 3: dynamic pre · axial · non-contrast · 1.3mm · 0.73mm/px · z∈[-127,+80]mm · 4 of 160 slices shown]
[im 1/160]
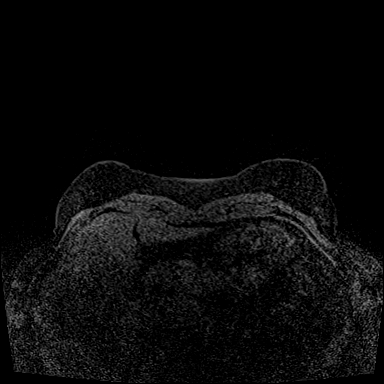
[im 54/160]
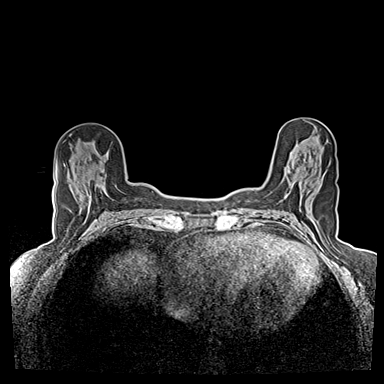
[im 107/160]
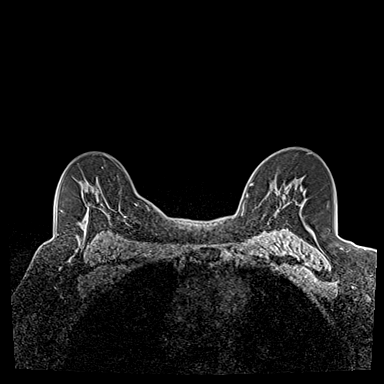
[im 160/160]
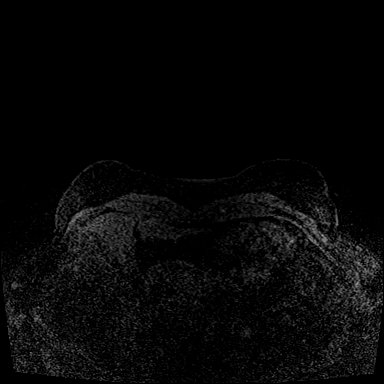

[Series 4: dynamic post 20 · axial · 1.3mm · 0.73mm/px · z∈[-127,+80]mm · 5 of 160 slices shown (1 of 2)]
[im 1/160]
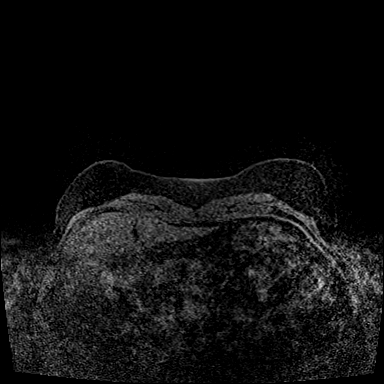
[im 40/160]
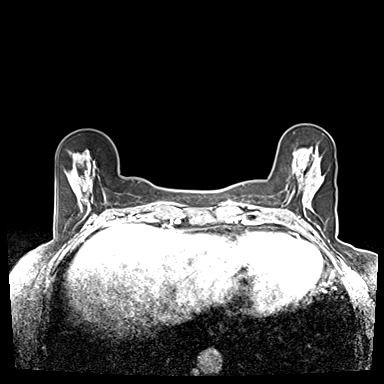
[im 80/160]
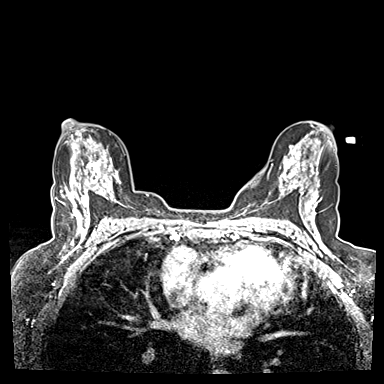
[im 120/160]
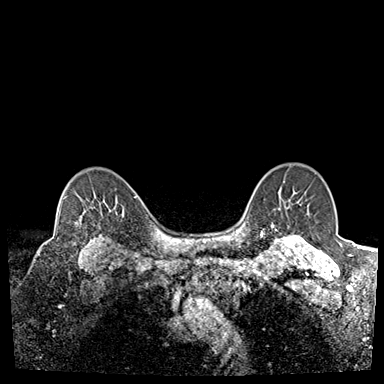
[im 160/160]
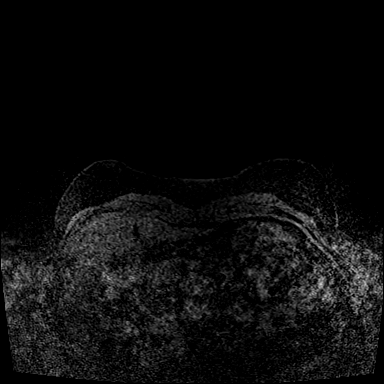

[Series 5: dynamic post 20 · axial · 1.3mm · 0.73mm/px · z∈[-127,+80]mm · 5 of 160 slices shown (2 of 2)]
[im 1/160]
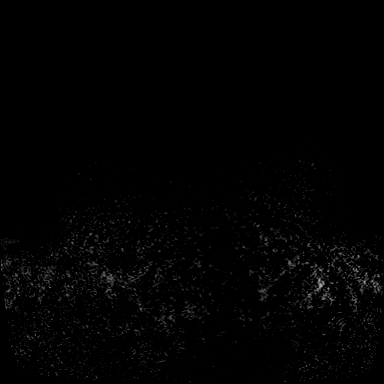
[im 40/160]
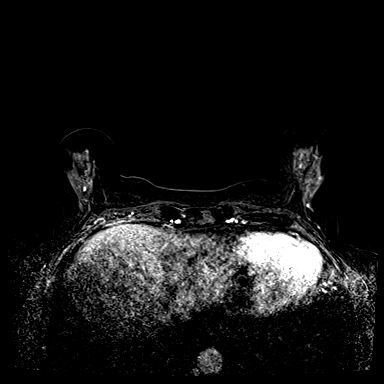
[im 80/160]
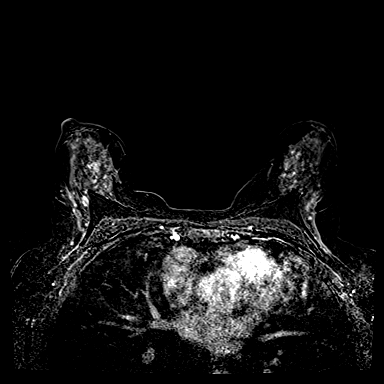
[im 120/160]
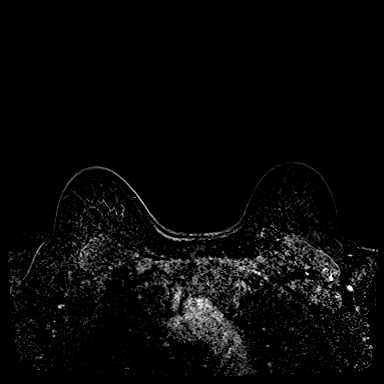
[im 160/160]
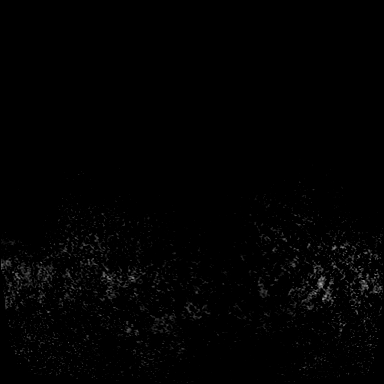

[Series 6: dynamic post 3 · axial · 1.3mm · 0.73mm/px · z∈[-127,+80]mm · 5 of 160 slices shown (1 of 2)]
[im 1/160]
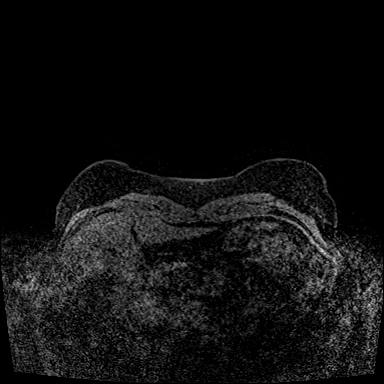
[im 40/160]
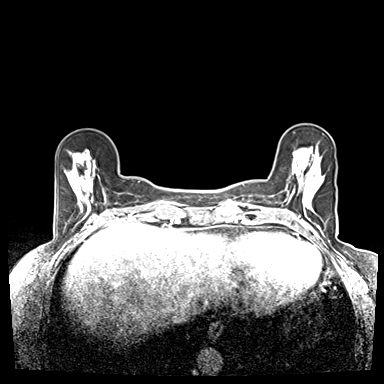
[im 80/160]
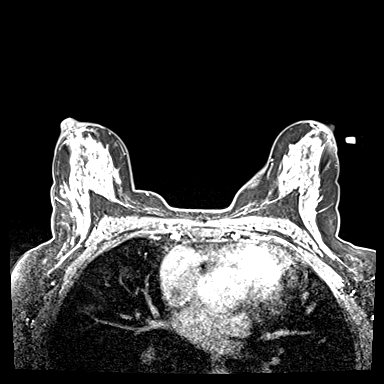
[im 120/160]
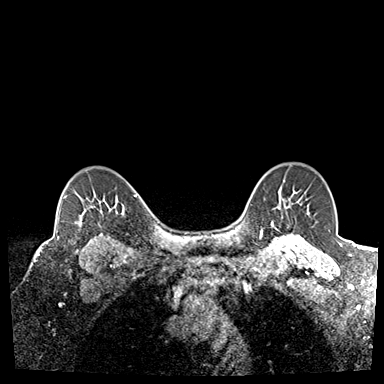
[im 160/160]
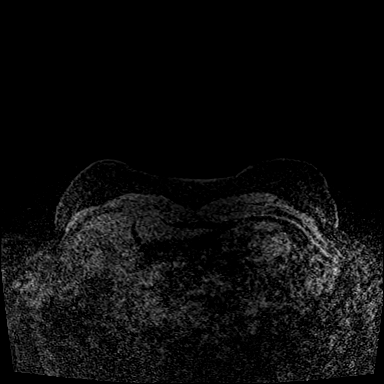

[Series 7: dynamic post 3 · axial · 1.3mm · 0.73mm/px · z∈[-127,+80]mm · 5 of 160 slices shown (2 of 2)]
[im 1/160]
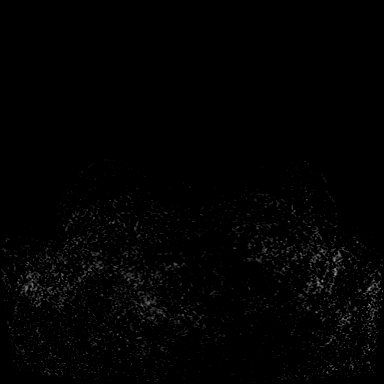
[im 40/160]
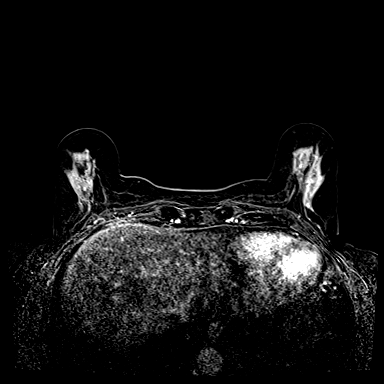
[im 80/160]
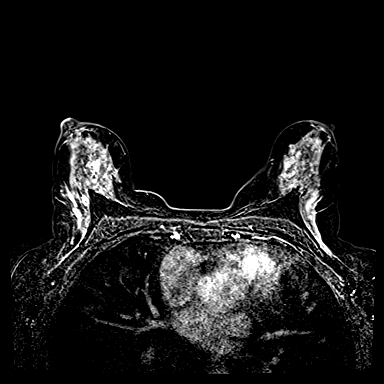
[im 120/160]
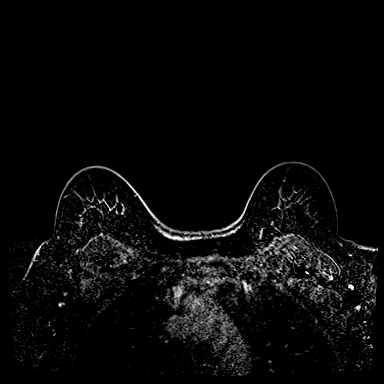
[im 160/160]
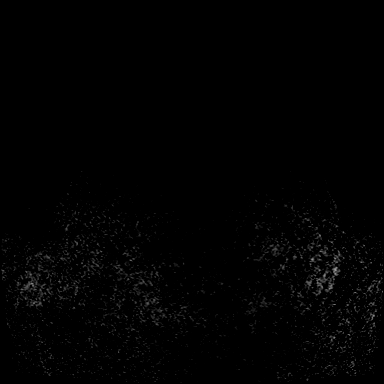

[Series 8: needle confirmation · axial · 1.3mm · 0.73mm/px · 1 of 160 slices shown]
[im 1/160]
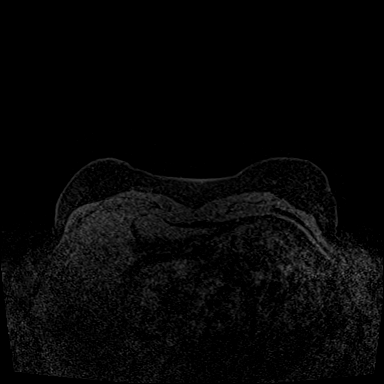

[29 of 48 positions shown; findings below may reference images not displayed]

FINDINGS: I met with the patient, and we discussed the procedure of MRI guided
biopsy, including risks, benefits, and alternatives. Specifically,
we discussed the risks of infection, bleeding, tissue injury, clip
migration, and inadequate sampling. Informed, written consent was
given. The usual time out protocol was performed immediately prior
to the procedure.

On initial images, 1 of the lesions previously noted in the LOWER
INNER QUADRANT of the RIGHT breast is no longer apparent. She

Site 1: Lesion quadrant: LOWER OUTER QUADRANT LEFT breast, barbell
clip

Using sterile technique and lidocaine and lidocaine with epinephrine
as local anesthetic, under MRI guidance, a 9 gauge vacuum assisted
device was used to perform core needle biopsy of enhancing mass
using a LATERAL to MEDIAL approach.

At the conclusion of the procedure, barbell tissue marker clip was
deployed into the biopsy cavity.

Site 2: LOWER INNER QUADRANT RIGHT breast, superior lesion, barbell
clip

Using sterile technique and lidocaine and lidocaine with epinephrine
as local anesthetic, under MRI guidance, a 9 gauge vacuum assisted
device was used to perform core needle biopsy of enhancing mass
using a LATERAL to MEDIAL approach.

At the conclusion of the procedure, barbell tissue marker clip was
deployed into the biopsy cavity.

Site 3: Lesion quadrant: LOWER INNER QUADRANT RIGHT breast, inferior
lesion, cylinder clip

Using sterile technique and lidocaine and lidocaine with epinephrine
as local anesthetic, under MRI guidance, a 9 gauge vacuum assisted
device was used to perform core needle biopsy of enhancing mass in
the LOWER INNER QUADRANT RIGHT breast, inferior lesion using a
LATERAL approach.

At the conclusion of the procedure, cylinder tissue marker clip was
deployed into the biopsy cavity.

Follow-up bilateral 2-view mammogram was performed and dictated
separately.
IMPRESSION: MRI guided biopsy of 1 lesion in the LEFT breast and 2 lesions in
the RIGHT breast. No apparent complications.

ADDENDUM:
Pathology revealed BENIGN BREAST TISSUE WITH FIBROADENOMA AND
ASSOCIATED COLUMNAR CELL CHANGE of the LEFT breast, lower outer
posterior, (barbell clip). This was found to be concordant by Dr.
GUNARAS.

Pathology revealed BENIGN BREAST TISSUE WITH FIBROADENOMATOID CHANGE
of the RIGHT breast, lower inner superior, (barbell clip). This was
found to be concordant by Dr. GUNARAS.

Pathology revealed BENIGN BREAST TISSUE WITH COLUMNAR CELL CHANGE,
FIBROADENOMATOID CHANGE, APOCRINE METAPLASIA AND FIBROCYSTIC CHANGE
of the RIGHT breast, lower inner inferior, (cylinder clip). This was
found to be concordant by Dr. GUNARAS.

Pathology results were discussed with the patient by telephone. The
patient reported doing well after the biopsies with tenderness at
the sites. Post biopsy instructions and care were reviewed and
questions were answered. The patient was encouraged to call The
direct phone number was provided.

The patient was instructed to return for a bilateral breast MRI in 6
months, per protocol, and to continue with annual screening
mammography at [facility] in [HOSPITAL][HOSPITAL].

Pathology results reported by GUNARAS, RN on [DATE].

*** End of Addendum ***
FINDINGS: I met with the patient, and we discussed the procedure of MRI guided
biopsy, including risks, benefits, and alternatives. Specifically,
we discussed the risks of infection, bleeding, tissue injury, clip
migration, and inadequate sampling. Informed, written consent was
given. The usual time out protocol was performed immediately prior
to the procedure.

On initial images, 1 of the lesions previously noted in the LOWER
INNER QUADRANT of the RIGHT breast is no longer apparent. She

Site 1: Lesion quadrant: LOWER OUTER QUADRANT LEFT breast, barbell
clip

Using sterile technique and lidocaine and lidocaine with epinephrine
as local anesthetic, under MRI guidance, a 9 gauge vacuum assisted
device was used to perform core needle biopsy of enhancing mass
using a LATERAL to MEDIAL approach.

At the conclusion of the procedure, barbell tissue marker clip was
deployed into the biopsy cavity.

Site 2: LOWER INNER QUADRANT RIGHT breast, superior lesion, barbell
clip

Using sterile technique and lidocaine and lidocaine with epinephrine
as local anesthetic, under MRI guidance, a 9 gauge vacuum assisted
device was used to perform core needle biopsy of enhancing mass
using a LATERAL to MEDIAL approach.

At the conclusion of the procedure, barbell tissue marker clip was
deployed into the biopsy cavity.

Site 3: Lesion quadrant: LOWER INNER QUADRANT RIGHT breast, inferior
lesion, cylinder clip

Using sterile technique and lidocaine and lidocaine with epinephrine
as local anesthetic, under MRI guidance, a 9 gauge vacuum assisted
device was used to perform core needle biopsy of enhancing mass in
the LOWER INNER QUADRANT RIGHT breast, inferior lesion using a
LATERAL approach.

At the conclusion of the procedure, cylinder tissue marker clip was
deployed into the biopsy cavity.

Follow-up bilateral 2-view mammogram was performed and dictated
separately.
IMPRESSION: MRI guided biopsy of 1 lesion in the LEFT breast and 2 lesions in
the RIGHT breast. No apparent complications.

## 2021-06-25 MED ORDER — GADOBUTROL 1 MMOL/ML IV SOLN
6.0000 mL | Freq: Once | INTRAVENOUS | Status: AC | PRN
  Administered 2021-06-25: 6 mL via INTRAVENOUS

## 2021-06-28 ENCOUNTER — Encounter: Payer: Self-pay | Admitting: Medical-Surgical

## 2021-06-29 ENCOUNTER — Other Ambulatory Visit: Payer: Self-pay | Admitting: Medical-Surgical

## 2021-06-29 DIAGNOSIS — R102 Pelvic and perineal pain: Secondary | ICD-10-CM

## 2021-06-29 DIAGNOSIS — Z8742 Personal history of other diseases of the female genital tract: Secondary | ICD-10-CM

## 2021-06-29 DIAGNOSIS — Z90710 Acquired absence of both cervix and uterus: Secondary | ICD-10-CM

## 2021-06-29 DIAGNOSIS — M545 Low back pain, unspecified: Secondary | ICD-10-CM

## 2021-06-29 NOTE — Progress Notes (Signed)
Please see the MyChart message reply(ies) for my assessment and plan.    This patient gave consent for this Medical Advice Message and is aware that it may result in a bill to [employer], as well as the possibility of receiving a bill for a co-payment or deductible. They are an established patient, but are not seeking medical advice exclusively about a problem treated during an in person or video visit in the last seven days. I did not recommend an in person or video visit within seven days of my reply.    I spent a total of 9 minutes cumulative time within 7 days through [employer].  Christen Butter, NP

## 2021-06-30 ENCOUNTER — Other Ambulatory Visit: Payer: No Typology Code available for payment source

## 2021-07-07 ENCOUNTER — Ambulatory Visit: Payer: No Typology Code available for payment source | Admitting: Family Medicine

## 2021-07-07 ENCOUNTER — Telehealth (INDEPENDENT_AMBULATORY_CARE_PROVIDER_SITE_OTHER): Payer: No Typology Code available for payment source | Admitting: Medical-Surgical

## 2021-07-07 ENCOUNTER — Encounter: Payer: Self-pay | Admitting: Medical-Surgical

## 2021-07-07 DIAGNOSIS — G8929 Other chronic pain: Secondary | ICD-10-CM

## 2021-07-07 DIAGNOSIS — M545 Low back pain, unspecified: Secondary | ICD-10-CM | POA: Diagnosis not present

## 2021-07-07 DIAGNOSIS — R29898 Other symptoms and signs involving the musculoskeletal system: Secondary | ICD-10-CM | POA: Diagnosis not present

## 2021-07-07 MED ORDER — DULOXETINE HCL 30 MG PO CPEP: 30.0000 mg | ORAL_CAPSULE | Freq: Every day | ORAL | 0 refills | Status: DC

## 2021-07-07 MED ORDER — DULOXETINE HCL 60 MG PO CPEP: 60.0000 mg | ORAL_CAPSULE | Freq: Every day | ORAL | 3 refills | Status: DC

## 2021-07-07 NOTE — Progress Notes (Signed)
Virtual Visit via Video Note  I connected with Michele Cohen on 07/07/21 at 11:30 AM EDT by a video enabled telemedicine application and verified that I am speaking with the correct person using two identifiers.   I discussed the limitations of evaluation and management by telemedicine and the availability of in person appointments. The patient expressed understanding and agreed to proceed.  Patient location: home Provider locations: office  Subjective:    CC: pelvic/back pain  HPI: Pleasant 41 year old female presenting via MyChart video visit to discuss pelvic and back pain that has been present for several months.  She continues to have pain in the low back with a tender spot on the left side that she reports is excruciating when touched.  Her pain radiates up to the middle of her back and the paraspinal muscles in the thoracic spine.  She has also had night sweats for the last 3 nights and reports feeling completely exhausted.  She does have a history of renal cysts and wonders if this is causing some of her difficulties.  Has tried over-the-counter Tylenol/ibuprofen but this has not been helpful with her discomfort.  Notes that the pain does not radiate down her legs however she has noted some weakness of her lower extremities along with paresthesias affecting both legs.  Her back pain usually comes and goes but over the last few months it has been more constant.  It is now severe enough that it is limiting her activities.  She has been working from home while propped up in bed on pillows and laying on a heating pad.  In the mornings, she reports that her body severely aches but once she gets up and moving, this does improve somewhat.  Currently taking sertraline 50 mg daily, tolerating well without side effects.  Past medical history, Surgical history, Family history not pertinant except as noted below, Social history, Allergies, and medications have been entered into the medical record,  reviewed, and corrections made.   Review of Systems: See HPI for pertinent positives and negatives.   Objective:    General: Speaking clearly in complete sentences without any shortness of breath.  Alert and oriented x3.  Normal judgment. No apparent acute distress.  Impression and Recommendations:    1. Chronic left-sided low back pain without sciatica 2. Complaints of weakness of lower extremity It appears that her most bothersome symptoms surround her lumbar spine as well as the pelvic region at the SI joint.  Would like to get further imaging with an MRI without contrast of the lumbar spine for further input.  She does have physical therapy for pelvic PT starting tomorrow which may also provide some good information regarding etiology for her overall discomfort.  For now, adding Cymbalta 30 mg daily for 1 week then increasing to 60 mg daily.  When she increases her dose of Cymbalta, recommend cutting back on the sertraline to 25 mg daily.  Depending on tolerance, we may be able to take her off the sertraline and do monotherapy with Cymbalta to help control various myalgias as well as mood. - MR LUMBAR SPINE WO CONTRAST; Future  I discussed the assessment and treatment plan with the patient. The patient was provided an opportunity to ask questions and all were answered. The patient agreed with the plan and demonstrated an understanding of the instructions.   The patient was advised to call back or seek an in-person evaluation if the symptoms worsen or if the condition fails to improve as anticipated.  25 minutes of non-face-to-face time was provided during this encounter.  Return if symptoms worsen or fail to improve.  Thayer Ohm, DNP, APRN, FNP-BC [facility]

## 2021-07-16 ENCOUNTER — Ambulatory Visit (INDEPENDENT_AMBULATORY_CARE_PROVIDER_SITE_OTHER): Payer: No Typology Code available for payment source

## 2021-07-16 DIAGNOSIS — R102 Pelvic and perineal pain: Secondary | ICD-10-CM

## 2021-07-16 DIAGNOSIS — Z8742 Personal history of other diseases of the female genital tract: Secondary | ICD-10-CM | POA: Diagnosis not present

## 2021-07-16 DIAGNOSIS — Z90710 Acquired absence of both cervix and uterus: Secondary | ICD-10-CM

## 2021-07-16 DIAGNOSIS — M545 Low back pain, unspecified: Secondary | ICD-10-CM

## 2021-07-16 IMAGING — CT CT ABD-PELV W/ CM
2 of 4 series · 15 of 46 positions shown, 17 images · IV contrast (APPLIED)
Comparison: CT abdomen and pelvis [DATE]

CLINICAL DATA: Pelvic pain

EXAM:
CT ABDOMEN AND PELVIS WITH CONTRAST
TECHNIQUE: Multidetector CT imaging of the abdomen and pelvis was performed
using the standard protocol following bolus administration of
intravenous contrast.

[Series 2: axial st · axial · 0.70mm/px · z∈[+686,+1106]mm · 12 of 92 slices shown, 14 images]
[im 4/92  soft-tissue]
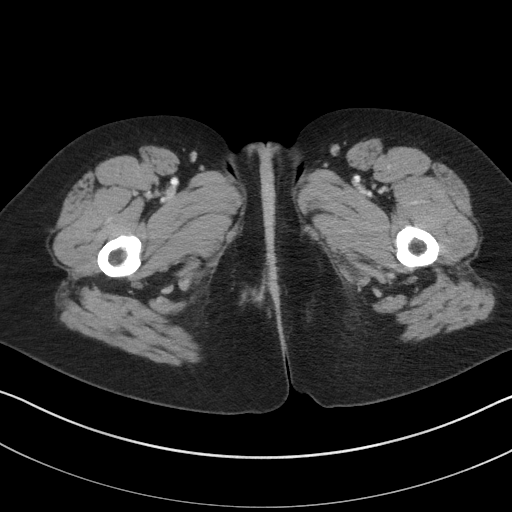
[im 4/92  bone]
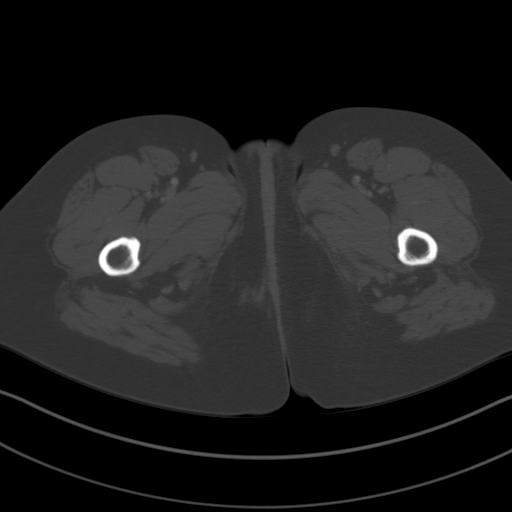
[im 12/92  soft-tissue]
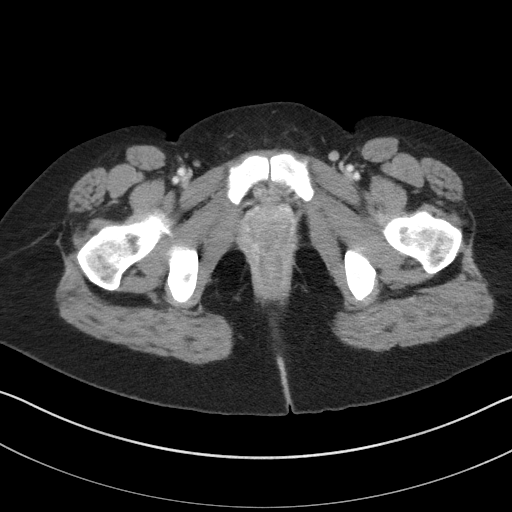
[im 19/92  soft-tissue]
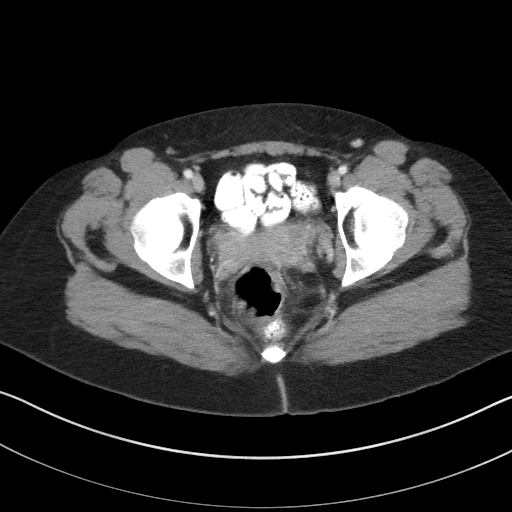
[im 27/92  soft-tissue]
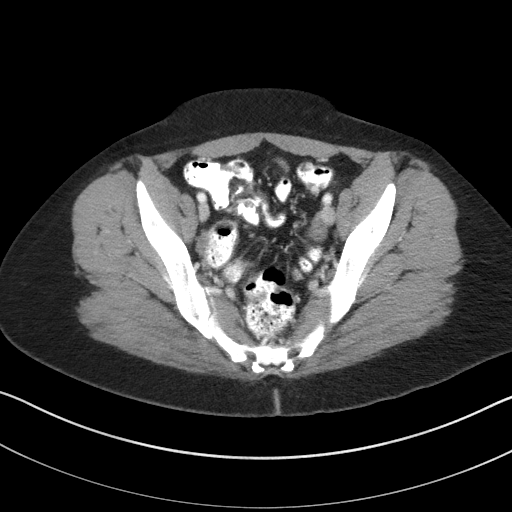
[im 35/92  soft-tissue]
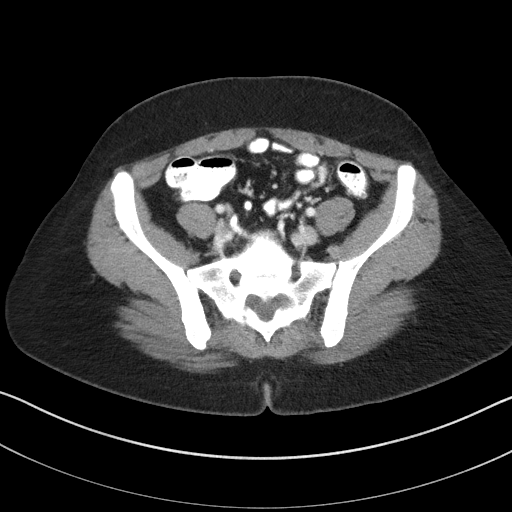
[im 42/92  soft-tissue]
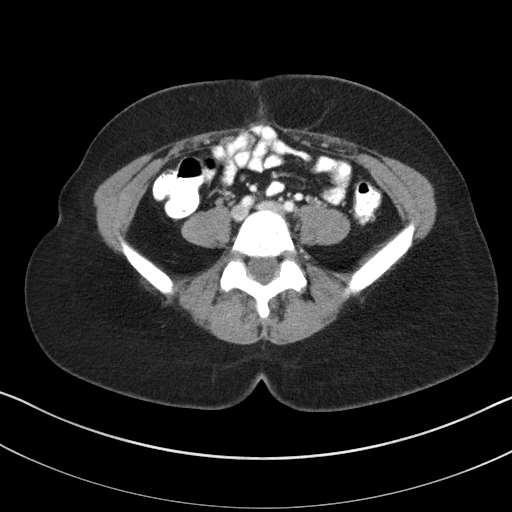
[im 50/92  soft-tissue]
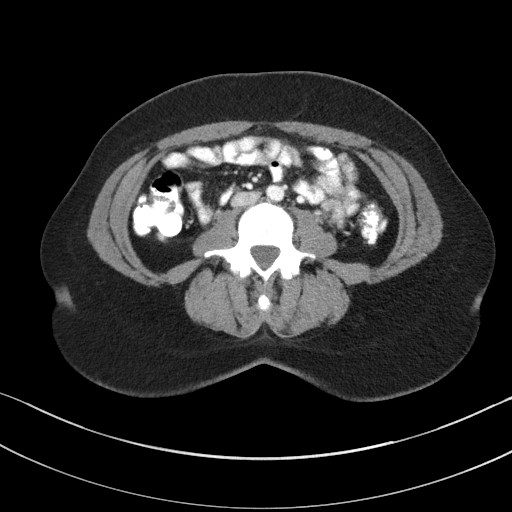
[im 57/92  soft-tissue]
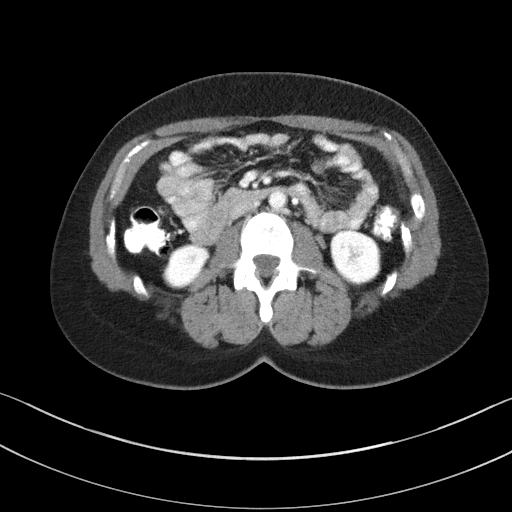
[im 65/92  soft-tissue]
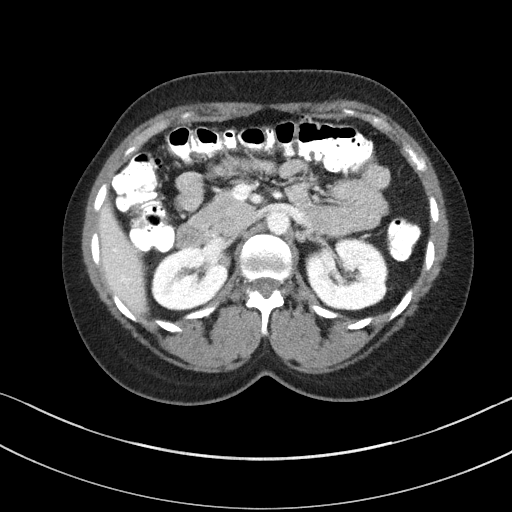
[im 65/92  bone]
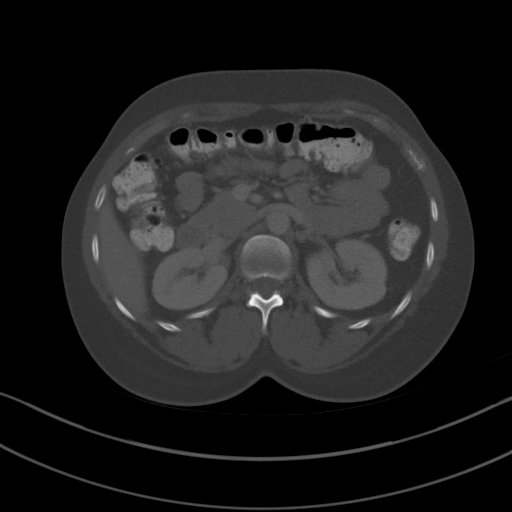
[im 73/92  soft-tissue]
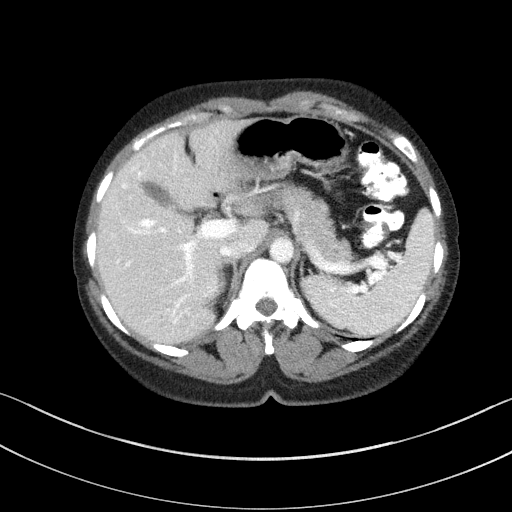
[im 80/92  soft-tissue]
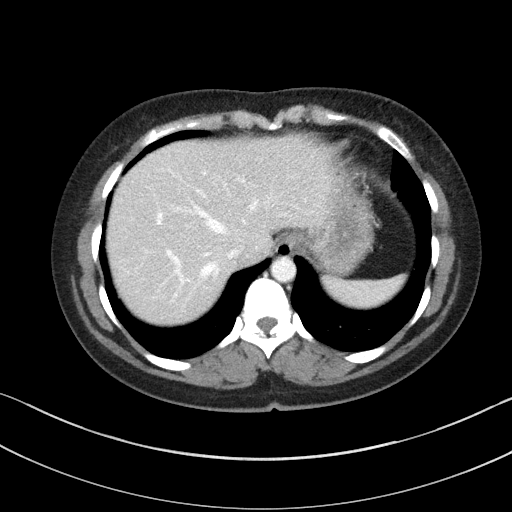
[im 88/92  soft-tissue]
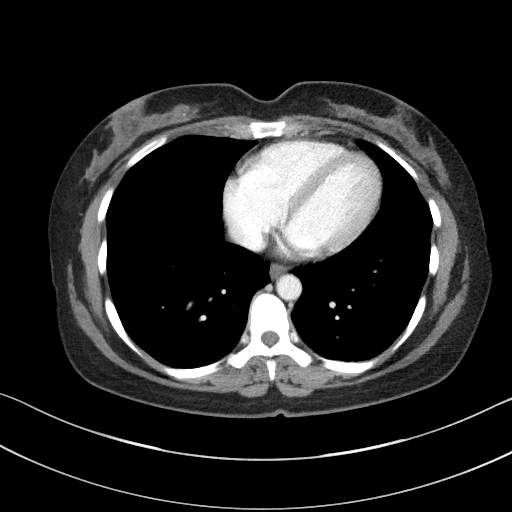

[Series 5: coronal st · coronal · 0.55mm/px · 3 of 72 slices shown]
[im 24/72  soft-tissue]
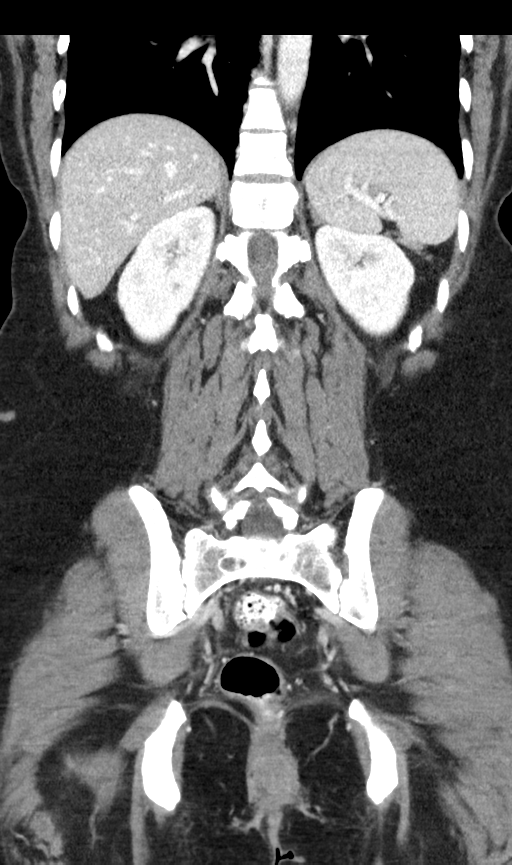
[im 32/72  soft-tissue]
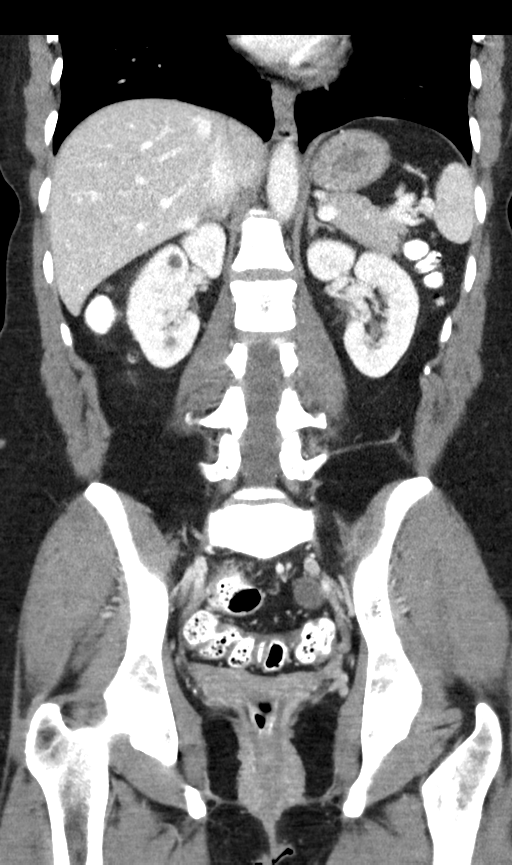
[im 40/72  soft-tissue]
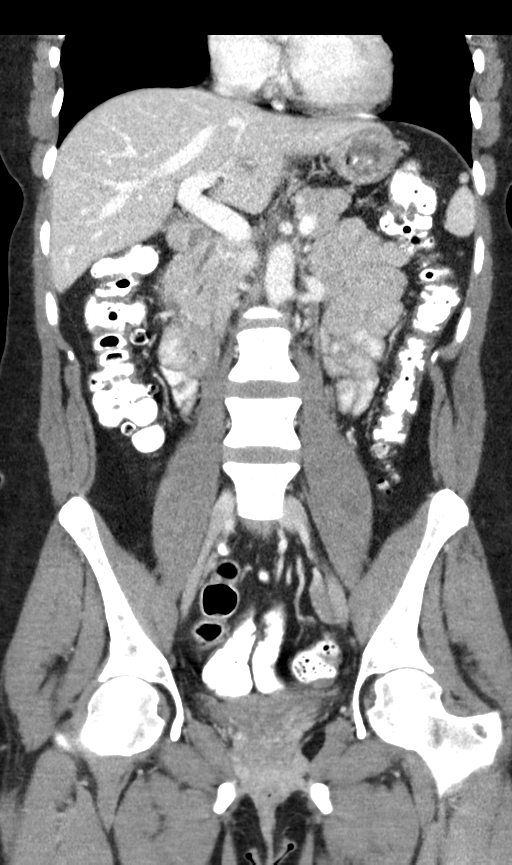

[15 of 46 positions shown; findings below may reference images not displayed]

RADIATION DOSE REDUCTION: This exam was performed according to the
departmental dose-optimization program which includes automated
exposure control, adjustment of the mA and/or kV according to
patient size and/or use of iterative reconstruction technique.

CONTRAST:  100mL OMNIPAQUE IOHEXOL 300 MG/ML  SOLN
FINDINGS: Lower chest: No acute abnormality.

Hepatobiliary: Liver is normal in size and contour. 11 mm enhancing
likely hemangioma in the posterior right hepatic lobe segment 7.
Gallbladder appears normal. No biliary ductal dilatation identified.

Pancreas: Unremarkable. No pancreatic ductal dilatation or
surrounding inflammatory changes.

Spleen: Normal in size without focal abnormality.

Adrenals/Urinary Tract: Adrenal glands appear normal. 3 mm calculus
in the upper pole right kidney. A few renal cortical cysts
identified measuring up to 9 mm on the right. No hydronephrosis
visualized bilaterally. Urinary bladder is incompletely distended
and not well evaluated.

Stomach/Bowel: No bowel obstruction, free air or pneumatosis.
Colonic diverticulosis. No bowel wall edema identified. Appendix is
normal.

Vascular/Lymphatic: No significant vascular findings are present. No
enlarged abdominal or pelvic lymph nodes.

Reproductive: Status post hysterectomy. No adnexal masses.

Other: No ascites.

Musculoskeletal: No acute or significant osseous findings.
IMPRESSION: 1. No acute process identified.
2. Colonic diverticulosis.
3. Small hepatic hemangioma.
4. Right renal calculus.  Small renal cysts.

## 2021-07-16 MED ORDER — IOHEXOL 300 MG/ML  SOLN
100.0000 mL | Freq: Once | INTRAMUSCULAR | Status: AC | PRN
  Administered 2021-07-16: 100 mL via INTRAVENOUS

## 2021-07-23 ENCOUNTER — Telehealth: Payer: Self-pay

## 2021-07-23 NOTE — Telephone Encounter (Signed)
Task completed. Per patient, back pain has worsen. She spends most of her time lying down. Using pain patches and ibuprofen as directed. However it provides very little relief. Patient is now experiencing some numbness and tingling in her hands. Patient has been scheduled a same day appointment on 08/04/21 at 1110 am.

## 2021-07-23 NOTE — Telephone Encounter (Signed)
Insurance denied approval for MRI imaging order.   Per insurance, there is no proof of physical exam results that included a detailed nervous system performed after the patient symptoms started or change that supports reasoning for imaging. Patient requires six weeks of provider directed treatment to be completed.   This must have been completed in the past three months without improved symptoms. Patient must have a documented office visit, phone, e-mail or messaging after the treatment is completed. Symptoms must be the same or worse after treatment to support imaging request.

## 2021-08-04 ENCOUNTER — Ambulatory Visit (INDEPENDENT_AMBULATORY_CARE_PROVIDER_SITE_OTHER): Payer: No Typology Code available for payment source | Admitting: Medical-Surgical

## 2021-08-04 ENCOUNTER — Encounter: Payer: Self-pay | Admitting: Medical-Surgical

## 2021-08-04 VITALS — BP 116/87 | HR 89 | Resp 20 | Ht 62.0 in | Wt 150.8 lb

## 2021-08-04 DIAGNOSIS — R202 Paresthesia of skin: Secondary | ICD-10-CM | POA: Diagnosis not present

## 2021-08-04 DIAGNOSIS — M2550 Pain in unspecified joint: Secondary | ICD-10-CM | POA: Diagnosis not present

## 2021-08-04 DIAGNOSIS — M545 Low back pain, unspecified: Secondary | ICD-10-CM | POA: Diagnosis not present

## 2021-08-04 MED ORDER — CYCLOBENZAPRINE HCL 10 MG PO TABS: 5.0000 mg | ORAL_TABLET | Freq: Three times a day (TID) | ORAL | 1 refills | Status: DC | PRN

## 2021-08-04 MED ORDER — DULOXETINE HCL 60 MG PO CPEP: 60.0000 mg | ORAL_CAPSULE | Freq: Two times a day (BID) | ORAL | 3 refills | Status: DC

## 2021-08-04 NOTE — Progress Notes (Signed)
Established Patient Office Visit  Subjective   Patient ID: Michele Cohen, female   DOB: 1980/12/22 Age: 41 y.o. MRN: [id number]   Chief Complaint  Patient presents with   Back Pain   Night Sweats   BODY ITCHING   Spasms    HPI Pleasant 41 year old female presenting today to discuss continued symptoms.  She has been having significant pelvic and back pain but has also developed some other concerning symptoms including:  Night sweats-these used to occur once every 2 weeks but have gotten much worse and now are happening twice nightly where she wakes up soaked with sweat and has to get up and change her clothes or her sheets.  Myalgias: Notes that her body hurts like she has the flu and that this is a constant every day occurrence.  The aches are everywhere rather than in a specific location.  Notes that some days are better than others but every day she is affected.  Her muscles are not tender to touch however they ache without provocation.  Has noted a sensation of itching all over her body with no known cause.  This occurs randomly and does not seem to be connected with any food intake, medications, or activity.  No new exposures.  Low back pain: Notes that this is occurring in her lumbar spine area and there is some discomfort at the midline however when she extends her lumbar spine and tilts her pelvis, she has pain that affects her low back bilaterally and wraps around her sides to her abdomen.  No weakness, saddle paresthesias, or new onset incontinence issues.  No fevers or other red flags.  Area affected is not tender to touch and is not painful until she moves in certain ways.  Notes that her hands have been numb and tingly, most notable in the mornings.  Notes that they feel cold and do not seem to respond normally to her efforts to do tasks.  Feels like her hands are moving in slow motion.  Fatigue: Notes that she has profound fatigue on a daily basis that makes it  difficult to function normally.   Objective:    Vitals:   08/04/21 1122  BP: 116/87  Pulse: 89  Resp: 20  Height: 5' 2"  (1.575 m)  Weight: 150 lb 12.8 oz (68.4 kg)  SpO2: 99%  BMI (Calculated): 27.57   Physical Exam Vitals and nursing note reviewed.  Constitutional:      General: She is not in acute distress.    Appearance: Normal appearance. She is not ill-appearing.  HENT:     Head: Normocephalic and atraumatic.  Cardiovascular:     Rate and Rhythm: Normal rate and regular rhythm.     Pulses: Normal pulses.     Heart sounds: Normal heart sounds.  Pulmonary:     Effort: Pulmonary effort is normal. No respiratory distress.     Breath sounds: Normal breath sounds. No wheezing, rhonchi or rales.  Musculoskeletal:     Right wrist: Normal.     Left wrist: Normal.     Right hand: Normal.     Left hand: Normal.     Cervical back: Pain with movement (Mild bilateral paraspinal discomfort with left and right rotation) present.     Lumbar back: No swelling, tenderness or bony tenderness.  Skin:    General: Skin is warm and dry.  Neurological:     Mental Status: She is alert and oriented to person, place, and time.  Psychiatric:  Mood and Affect: Mood normal.        Behavior: Behavior normal.        Thought Content: Thought content normal.        Judgment: Judgment normal.   No results found for this or any previous visit (from the past 24 hour(s)).     The ASCVD Risk score (Arnett DK, et al., 2019) failed to calculate for the following reasons:   Cannot find a previous HDL lab   Cannot find a previous total cholesterol lab   Assessment & Plan:   1. Polyarthralgia 2. Paresthesia of both feet 3. Paresthesia of both hands Multiple symptoms with unclear etiology.  Some concern for autoimmune/rheumatological etiology so work-up as below.  She has had some benefit to her mood as well as her myalgias with Cymbalta at 60 mg but she does still have some room for  improvement.  Increasing Cymbalta to 60 mg twice daily, discontinue Zoloft.  Suspect paresthesias of her hands are related to cervical spine etiology (see below).  - CK - High sensitivity CRP - Sedimentation rate - ANA,IFA RA Diag Pnl w/rflx Tit/Patn - HIV Antibody (routine testing w rflx) - Hepatitis C Antibody - Zinc - Copper, Blood - Magnesium - Fe+TIBC+Fer - CBC with Differential/Platelet - Uric acid - HLA-B27 antigen - Rheumatoid Arthritis Diagnostic Panel, Comprehensive  4. Bilateral low back pain without sciatica, unspecified chronicity Reviewed imaging from January with Dr. Dianah Field for potential contributing factors.  There were some concerns to be found on cervical and lumbar spine imaging.  After review, would like for her to follow-up with Dr. Dianah Field for further evaluation and treatment recommendations.  Return for back pain/neck concerns at your convenience.  ___________________________________________ Clearnce Sorrel, DNP, APRN, FNP-BC [facility] [place]

## 2021-08-09 ENCOUNTER — Encounter: Payer: Self-pay | Admitting: Medical-Surgical

## 2021-08-09 DIAGNOSIS — G1220 Motor neuron disease, unspecified: Secondary | ICD-10-CM

## 2021-08-09 DIAGNOSIS — M545 Low back pain, unspecified: Secondary | ICD-10-CM

## 2021-08-09 DIAGNOSIS — G5690 Unspecified mononeuropathy of unspecified upper limb: Secondary | ICD-10-CM

## 2021-08-09 DIAGNOSIS — R29898 Other symptoms and signs involving the musculoskeletal system: Secondary | ICD-10-CM

## 2021-08-09 DIAGNOSIS — M2550 Pain in unspecified joint: Secondary | ICD-10-CM

## 2021-08-09 DIAGNOSIS — R253 Fasciculation: Secondary | ICD-10-CM

## 2021-08-12 LAB — CBC WITH DIFFERENTIAL/PLATELET
Absolute Monocytes: 237 cells/uL (ref 200–950)
Basophils Absolute: 30 cells/uL (ref 0–200)
Basophils Relative: 0.8 %
Eosinophils Absolute: 59 cells/uL (ref 15–500)
Eosinophils Relative: 1.6 %
HCT: 41.4 % (ref 35.0–45.0)
Hemoglobin: 13.7 g/dL (ref 11.7–15.5)
Lymphs Abs: 981 cells/uL (ref 850–3900)
MCH: 28.8 pg (ref 27.0–33.0)
MCHC: 33.1 g/dL (ref 32.0–36.0)
MCV: 87.2 fL (ref 80.0–100.0)
MPV: 10.6 fL (ref 7.5–12.5)
Monocytes Relative: 6.4 %
Neutro Abs: 2394 cells/uL (ref 1500–7800)
Neutrophils Relative %: 64.7 %
Platelets: 268 10*3/uL (ref 140–400)
RBC: 4.75 10*6/uL (ref 3.80–5.10)
RDW: 12.7 % (ref 11.0–15.0)
Total Lymphocyte: 26.5 %
WBC: 3.7 10*3/uL — ABNORMAL LOW (ref 3.8–10.8)

## 2021-08-12 LAB — HIV ANTIBODY (ROUTINE TESTING W REFLEX): HIV 1&2 Ab, 4th Generation: NONREACTIVE

## 2021-08-12 LAB — ANA,IFA RA DIAG PNL W/RFLX TIT/PATN
Anti Nuclear Antibody (ANA): NEGATIVE
Cyclic Citrullin Peptide Ab: <16 UNITS
Rheumatoid fact SerPl-aCnc: <14 IU/mL (ref <14)

## 2021-08-12 LAB — HIGH SENSITIVITY CRP: hs-CRP: 5.2 mg/L — ABNORMAL HIGH

## 2021-08-12 LAB — CK: Total CK: 60 U/L (ref 29–143)

## 2021-08-12 LAB — MAGNESIUM: Magnesium: 2 mg/dL (ref 1.5–2.5)

## 2021-08-12 LAB — RHEUMATOID ARTHRITIS DIAGNOSTIC PANEL, COMPREHENSIVE
Cyclic Citrullin Peptide Ab: <16 Units (ref <20)
Rheumatoid Factor (IgA): <5 U (ref <=6)
Rheumatoid Factor (IgG): <5 U (ref <=6)
Rheumatoid Factor (IgM): <5 U (ref <=6)
SSA (Ro) (ENA) Antibody, IgG: <1 AI
SSB (La) (ENA) Antibody, IgG: <1 AI

## 2021-08-12 LAB — HEPATITIS C ANTIBODY: Hepatitis C Ab: NONREACTIVE

## 2021-08-12 LAB — ZINC: Zinc: 64 ug/dL (ref 60–130)

## 2021-08-12 LAB — SEDIMENTATION RATE: Sed Rate: 2 mm/h (ref 0–20)

## 2021-08-12 LAB — COPPER, BLOOD: Copper, Blood: 100 ug/dL

## 2021-08-12 LAB — IRON,TIBC AND FERRITIN PANEL
%SAT: 22 % (calc) (ref 16–45)
Ferritin: 35 ng/mL (ref 16–232)
Iron: 67 ug/dL (ref 40–190)
TIBC: 301 mcg/dL (calc) (ref 250–450)

## 2021-08-12 LAB — URIC ACID: Uric Acid, Serum: 3 mg/dL (ref 2.5–7.0)

## 2021-08-12 LAB — HLA-B27 ANTIGEN: HLA-B27 Antigen: NEGATIVE

## 2021-08-23 ENCOUNTER — Ambulatory Visit: Payer: No Typology Code available for payment source | Admitting: Rehabilitative and Restorative Service Providers"

## 2021-08-23 ENCOUNTER — Ambulatory Visit (INDEPENDENT_AMBULATORY_CARE_PROVIDER_SITE_OTHER): Payer: No Typology Code available for payment source

## 2021-08-23 DIAGNOSIS — M6281 Muscle weakness (generalized): Secondary | ICD-10-CM | POA: Diagnosis not present

## 2021-08-23 DIAGNOSIS — R202 Paresthesia of skin: Secondary | ICD-10-CM | POA: Diagnosis not present

## 2021-08-23 DIAGNOSIS — G1220 Motor neuron disease, unspecified: Secondary | ICD-10-CM

## 2021-08-23 DIAGNOSIS — R519 Headache, unspecified: Secondary | ICD-10-CM

## 2021-08-23 DIAGNOSIS — R253 Fasciculation: Secondary | ICD-10-CM | POA: Diagnosis not present

## 2021-08-23 MED ORDER — GADOBUTROL 1 MMOL/ML IV SOLN
7.0000 mL | Freq: Once | INTRAVENOUS | Status: AC | PRN
  Administered 2021-08-23: 7 mL via INTRAVENOUS

## 2021-08-24 NOTE — Addendum Note (Signed)
Addended byChristen Butter on: 08/24/2021 08:16 AM   Modules accepted: Orders

## 2021-08-24 NOTE — Addendum Note (Signed)
Addended by: Chalmers Cater on: 08/24/2021 07:59 AM   Modules accepted: Orders

## 2021-08-31 ENCOUNTER — Ambulatory Visit: Payer: No Typology Code available for payment source | Admitting: Medical-Surgical

## 2021-09-02 ENCOUNTER — Encounter: Payer: No Typology Code available for payment source | Admitting: Sports Medicine

## 2021-09-08 ENCOUNTER — Ambulatory Visit (INDEPENDENT_AMBULATORY_CARE_PROVIDER_SITE_OTHER): Payer: No Typology Code available for payment source | Admitting: Sports Medicine

## 2021-09-08 DIAGNOSIS — M2550 Pain in unspecified joint: Secondary | ICD-10-CM

## 2021-09-08 DIAGNOSIS — M797 Fibromyalgia: Secondary | ICD-10-CM | POA: Insufficient documentation

## 2021-09-08 MED ORDER — PREGABALIN 25 MG PO CAPS: 25.0000 mg | ORAL_CAPSULE | Freq: Two times a day (BID) | ORAL | 3 refills | Status: DC

## 2021-09-08 NOTE — Assessment & Plan Note (Signed)
This is a very pleasant 41 year old female, previously healthy, she is long history of widespread muscle aches and pains, trapezius, shoulders, hips, back. She does endorse occasional paresthesias hands and feet. She has had a fairly extensive work-up thus far with a rheumatoid work-up, vitamin levels including B12 and D, lupus work-up, all of which have been unrevealing. X-rays of her spine have been unrevealing. She did have a CT abdomen and pelvis which I reviewed, spine looks normal though we are still awaiting a lumbar spine MRI. Brain MRI unrevealing. She was placed on Cymbalta by her PCP, she was also appropriately increased to the maximum dose at the last visit with her PCP, which was a great idea. She has tried gabapentin in the past which created excessive sedation. Looks like she is also been on amitriptyline, meloxicam, topiramate without sufficient improvement. I did explain to her the limitations in modern medicine with diagnosing people with her complaints at this point in the work-up. I do suspect we need to complete the picture with at least a bilateral upper extremity and lower extremity nerve conduction and EMG but that I suspected this was more of a myofascial pain syndrome/fibromyalgia, she was very understanding. We did discuss physical therapy, frequent exercise, mindfulness, and potentially dry needling/acupuncture. We will start low-dose Lyrica with a monthly titration of the dose. We can revisit this via phone in a month.

## 2021-09-08 NOTE — Progress Notes (Signed)
    Procedures performed today:    None.  Independent interpretation of notes and tests performed by another provider:   I personally reviewed her abdominal and pelvic CT with close eye on her lumbar spine, this was all normal.  Brief History, Exam, Impression, and Recommendations:    Polyarthralgia This is a very pleasant 41 year old female, previously healthy, she is long history of widespread muscle aches and pains, trapezius, shoulders, hips, back. She does endorse occasional paresthesias hands and feet. She has had a fairly extensive work-up thus far with a rheumatoid work-up, vitamin levels including B12 and D, lupus work-up, all of which have been unrevealing. X-rays of her spine have been unrevealing. She did have a CT abdomen and pelvis which I reviewed, spine looks normal though we are still awaiting a lumbar spine MRI. Brain MRI unrevealing. She was placed on Cymbalta by her PCP, she was also appropriately increased to the maximum dose at the last visit with her PCP, which was a great idea. She has tried gabapentin in the past which created excessive sedation. Looks like she is also been on amitriptyline, meloxicam, topiramate without sufficient improvement. I did explain to her the limitations in modern medicine with diagnosing people with her complaints at this point in the work-up. I do suspect we need to complete the picture with at least a bilateral upper extremity and lower extremity nerve conduction and EMG but that I suspected this was more of a myofascial pain syndrome/fibromyalgia, she was very understanding. We did discuss physical therapy, frequent exercise, mindfulness, and potentially dry needling/acupuncture. We will start low-dose Lyrica with a monthly titration of the dose. We can revisit this via phone in a month.    ____________________________________________ Ihor Austin. Benjamin Stain, M.D., ABFM., CAQSM., AME. Primary [facility] and Sports Medicine [place]  [facility]  Adjunct Professor of Family Medicine  [employer]  Restaurant manager, fast food

## 2021-09-22 ENCOUNTER — Ambulatory Visit: Payer: No Typology Code available for payment source | Admitting: Gastroenterology

## 2021-09-22 NOTE — Progress Notes (Deleted)
[facility] Progress Note  Chief Complaint: Bloating, nausea and altered bowel habits  Subjective  History: Kelley was seen in the office February 2022 after previous care in the [facility] with multiple symptoms suggesting a functional bowel disorder but also reportedly previous testing confirm SIBO, treatment for which gave incomplete relief. EGD and colonoscopy March 2022 unrevealing.  Colonic diverticulosis noted.  Anorectal manometry and SIBO planned.  She canceled the ARM and no SIBO results received.  She has been following with [facility] gynecology, with the following from in April 2023 office note: "41 y.o. female [phone] with clinical history and exam findings consistent with chronic pelvic pain, hx of endometriosis, pelvic floor tension myalgia  Endometriosis: Pt reports hx of endometriosis, s/p LAVH, ovaries retained. Unsure of stage of endometriosis at time of surgery, she will request records. Recent imaging with normal ovaries, no evidence of endometrioma. Discussed with patient that non-cyclic nature of pain symptoms is less supportive of endometriosis, however this is a possible source of continued pain.  - Pt to send operative report when able.  - Exam today with no pain on palpation of vaginal cuff, no nodularity noted, do not suspect endometriosis as the current source of her pain.  Pelvic floor tension myalgia:  The patient was counseled that pelvic pain may have many sources, and at times, pelvic floor muscle spasm can be a major contributor. The origin of pelvic floor muscle spasm can be multifactorial, including primary, reactive to a different pain source, trauma, or even part of a centralized pain syndrome. With regard to pelvic floor muscle spasm, her clinical history and exam findings support this diagnosis. Our primary intervention is typically pelvic floor physical therapy, and for some patients, the addition of local or orally administered  muscle relaxants, or centrally acting pain medications (e.g. Gabapentin, Cymbalta) can be effective.  - referral to PT placed, continue home exercises - Plan for flexeril prn for pain flares, Rx sent  "  She has been participating in pelvic PT.  In addition, she has a chronic musculoskeletal pain syndrome managed by primary care. ____________________________   ***  ROS: Cardiovascular:  no chest pain Respiratory: no dyspnea  The patient's Past Medical, Family and Social History were reviewed and are on file in the EMR.  Objective:  Med list reviewed  Current Outpatient Medications:    cyclobenzaprine (FLEXERIL) 10 MG tablet, Take 0.5-1 tablets (5-10 mg total) by mouth 3 (three) times daily as needed for muscle spasms. Caution: can cause drowsiness, Disp: 60 tablet, Rfl: 1   DULoxetine (CYMBALTA) 60 MG capsule, Take 1 capsule (60 mg total) by mouth 2 (two) times daily., Disp: 60 capsule, Rfl: 3   meloxicam (MOBIC) 15 MG tablet, TAKE 1 TABLET BY MOUTH EVERY DAY AS NEEDED FOR PAIN, Disp: 30 tablet, Rfl: 0   pregabalin (LYRICA) 25 MG capsule, Take 1 capsule (25 mg total) by mouth 2 (two) times daily., Disp: 60 capsule, Rfl: 3   Vital signs in last 24 hrs: There were no vitals filed for this visit. Wt Readings from Last 3 Encounters:  08/04/21 150 lb 12.8 oz (68.4 kg)  06/23/21 148 lb 6.4 oz (67.3 kg)  04/23/21 148 lb 11.2 oz (67.4 kg)    Physical Exam  *** HEENT: sclera anicteric, oral mucosa moist without lesions Neck: supple, no thyromegaly, JVD or lymphadenopathy Cardiac: ***,  no peripheral edema Pulm: clear to auscultation bilaterally, normal RR and effort noted Abdomen: soft, *** tenderness, with active  bowel sounds. No guarding or palpable hepatosplenomegaly. Skin; warm and dry, no jaundice or rash  Labs:   ___________________________________________ Radiologic  studies:   ____________________________________________ Other:   _____________________________________________ Assessment & Plan  Assessment: No diagnosis found.    Plan:   *** minutes were spent on this encounter (including chart review, history/exam, counseling/coordination of care, and documentation) > 50% of that time was spent on counseling and coordination of care.   Charlie Pitter III

## 2021-09-28 ENCOUNTER — Encounter: Payer: Self-pay | Admitting: Neurology

## 2021-09-28 ENCOUNTER — Telehealth: Payer: Self-pay | Admitting: Neurology

## 2021-09-28 ENCOUNTER — Ambulatory Visit (INDEPENDENT_AMBULATORY_CARE_PROVIDER_SITE_OTHER): Payer: No Typology Code available for payment source | Admitting: Neurology

## 2021-09-28 VITALS — BP 106/70 | HR 99 | Ht 62.0 in | Wt 150.0 lb

## 2021-09-28 DIAGNOSIS — R253 Fasciculation: Secondary | ICD-10-CM | POA: Diagnosis not present

## 2021-09-28 DIAGNOSIS — M542 Cervicalgia: Secondary | ICD-10-CM | POA: Diagnosis not present

## 2021-09-28 NOTE — Progress Notes (Signed)
Chief Complaint  Patient presents with   Michele Patient (Initial Visit)    Room 12, alone  NP internal referral for weakness of lower extremity, upper extremity neuropathy, muscle twitching Sx started 1 year ago numbness in hands, tingling, headaches, and spasms started 6 weeks, lyrica has helped some  Would like a Michele Emg/NCS work up       Michele Cohen is a 41 y.o. female   Chronic neck pain, radiating pain to bilateral shoulder Michele onset fasciculation, intermittent body achy pain  Brisk reflex on examinations, patient is very much worried about her symptoms, desire further evaluation,  MRI of the cervical spine to rule out cervical spondylitic myelopathy  Personally reviewed MRI of the brain with and without contrast in July 2023 that was normal  Extensive laboratory evaluations in 2023 showed no significant abnormalities  Encouraged her to use Cymbalta 60 mg daily as prescribed by previous provider, Lyrica as needed for muscle fasciculations   DIAGNOSTIC DATA (LABS, IMAGING, TESTING) - I reviewed patient records, labs, notes, testing and imaging myself where available.  August 23 2021: Normal MRI of the brain CT of abdomen, pelvic showed no acute process, small hepatic hemangioma, right renal calculus  Laboratory evaluation, negative rheumatoid factor, HLA-B27, uric acid, CBC, iron panel, magnesium, copper, zinc, hepatitis C, HIV, ANA, ESR, CPK, elevation of high-sensitivity C-reactive protein 5.20,  MEDICAL HISTORY:  Michele Cohen is a 41 year old female, seen in request by her primary care nurse practitioner Samuel Bouche, for  elevation of muscle fasciculation, initial evaluation was on September 28, 2021   I reviewed and summarized the referring note PMHX. PE in 2019, following hysterectomy  She had long history of heavy menstrual bleeding, eventually required hysterectomy in 2019, following that, she suffered pulmonary emboli, she is no longer  on anticoagulation treatment  She had a history of migraines since age 70s, typical migraine retro-orbital area severe pounding headache with light noise sensitivity, nauseous lasting for few hours, sleep always helps, she rarely has migraine headaches  She exercises couple times a week treadmill for 30 minutes, mild weightlifting without any difficulty  Around June 2023, she began to notice intermittent muscle twitching, gradually spreading to different body part, lasting for few seconds, no muscle weakness, no atrophy, she has intermittent neck pain, radiating pain to bilateral shoulder  She did Google search her symptoms, very concerned about the possibility of ALS  She also has intermittent bilateral hands paresthesia  She had EMG nerve conduction study by outside neurologist, which showed evidence of 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  There was also evidence of carpal tunnel syndromes,  She was given Lyrica low-dose does help her muscle fasciculation,  She had long history of intermittent muscle aching pain, fatigue, was given Cymbalta, has not tried it yet  PHYSICAL EXAM:   Vitals:   09/28/21 1433  BP: 106/70  Pulse: 99  Weight: 150 lb (68 kg)  Height: 5' 2"  (1.575 m)   Body mass index is 27.44 kg/m.  PHYSICAL EXAMNIATION:  Gen: NAD, conversant, well nourised, well groomed                     Cardiovascular: Regular rate rhythm, no peripheral edema, warm, nontender. Eyes: Conjunctivae clear without exudates or hemorrhage Neck: Supple, no carotid bruits. Pulmonary: Clear to auscultation bilaterally   NEUROLOGICAL EXAM:  MENTAL STATUS: Speech/cognition: Awake, alert, oriented to history taking and casual conversation CRANIAL NERVES: CN II:  Visual fields are full to confrontation. Pupils are round equal and briskly reactive to light. CN III, IV, VI: extraocular movement are normal. No ptosis. CN V: Facial sensation is intact  to light touch CN VII: Face is symmetric with normal eye closure  CN VIII: Hearing is normal to causal conversation. CN IX, X: Phonation is normal. CN XI: Head turning and shoulder shrug are intact  MOTOR: There is no pronator drift of out-stretched arms. Muscle bulk and tone are normal. Muscle strength is normal.  REFLEXES: Reflexes are 2+ and symmetric at the biceps, triceps, knees, and ankles. Plantar responses are flexor.  SENSORY: Intact to light touch, pinprick and vibratory sensation are intact in fingers and toes.  COORDINATION: There is no trunk or limb dysmetria noted.  GAIT/STANCE: Posture is normal. Gait is steady with normal steps, base, arm swing, and turning. Heel and toe walking are normal. Tandem gait is normal.  Romberg is absent.  REVIEW OF SYSTEMS:  Full 14 system review of systems performed and notable only for as above All other review of systems were negative.   ALLERGIES: Allergies  Allergen Reactions   Aztreonam Anaphylaxis   Phenazopyridine Anaphylaxis    itching   Shellfish Allergy Anaphylaxis   Zolpidem Hives, Itching and Rash    Ambien   Zolpidem Tartrate Itching    HOME MEDICATIONS: Current Outpatient Medications  Medication Sig Dispense Refill   pregabalin (LYRICA) 25 MG capsule Take 1 capsule (25 mg total) by mouth 2 (two) times daily. 60 capsule 3   No current facility-administered medications for this visit.    PAST MEDICAL HISTORY: Past Medical History:  Diagnosis Date   Anemia    Concussion    Diverticulosis    Headache disorder    History of benign breast tumor    Lumbar radiculopathy    Pulmonary embolism ([place]) 2019   Renal cyst    Stress headaches     PAST SURGICAL HISTORY: Past Surgical History:  Procedure Laterality Date   ABDOMINAL HYSTERECTOMY     BREAST CYST EXCISION     COLONOSCOPY  04/14/2020   2020 and 2005   fallopian tube      removal    UPPER GASTROINTESTINAL ENDOSCOPY  04/14/2020   2006     FAMILY HISTORY: Family History  Problem Relation Age of Onset   Breast cancer Mother    Heart attack Mother    Diabetes Mother    Hypertension Mother    Heart failure Father    Diabetes Father    Hypertension Father    Pancreatic cancer Maternal Grandfather    Colon cancer Paternal Grandfather 14       70s ?   Colon polyps Paternal Grandfather    Esophageal cancer Neg Hx    Rectal cancer Neg Hx    Stomach cancer Neg Hx     SOCIAL HISTORY: Social History   Socioeconomic History   Marital status: Married    Spouse name: Not on file   Number of children: Not on file   Years of education: Not on file   Highest education level: Not on file  Occupational History   Not on file  Tobacco Use   Smoking status: Never   Smokeless tobacco: Never  Vaping Use   Vaping Use: Never used  Substance and Sexual Activity   Alcohol use: No    Comment: occasional   Drug use: Never   Sexual activity: Yes    Birth control/protection: None  Other Topics  Concern   Not on file  Social History Narrative   Not on file   Social Determinants of Health   Financial Resource Strain: Not on file  Food Insecurity: Not on file  Transportation Needs: Not on file  Physical Activity: Not on file  Stress: Not on file  Social Connections: Not on file  Intimate Partner Violence: Not on file      Marcial Pacas, M.D. Ph.D.  [facility] [address] [place], [place] [address] Ph: [phone] Fax: [phone]  CC:  Samuel Bouche, [address] [place],  [place] [address]  Samuel Bouche, NP

## 2021-09-28 NOTE — Telephone Encounter (Signed)
[employer] sent to GI they obtain auth and will call the patient to schedule 

## 2021-10-03 ENCOUNTER — Encounter: Payer: Self-pay | Admitting: Medical-Surgical

## 2021-10-03 DIAGNOSIS — M2550 Pain in unspecified joint: Secondary | ICD-10-CM

## 2021-10-06 ENCOUNTER — Encounter: Payer: Self-pay | Admitting: Sports Medicine

## 2021-10-06 ENCOUNTER — Telehealth (INDEPENDENT_AMBULATORY_CARE_PROVIDER_SITE_OTHER): Payer: No Typology Code available for payment source | Admitting: Sports Medicine

## 2021-10-06 ENCOUNTER — Ambulatory Visit
Admission: RE | Admit: 2021-10-06 | Discharge: 2021-10-06 | Disposition: A | Discharge status: Home / Self Care | Payer: No Typology Code available for payment source | Source: Ambulatory Visit | Attending: Neurology | Admitting: Neurology

## 2021-10-06 DIAGNOSIS — R253 Fasciculation: Secondary | ICD-10-CM | POA: Diagnosis not present

## 2021-10-06 DIAGNOSIS — M2550 Pain in unspecified joint: Secondary | ICD-10-CM

## 2021-10-06 DIAGNOSIS — M542 Cervicalgia: Secondary | ICD-10-CM

## 2021-10-06 MED ORDER — PREGABALIN 50 MG PO CAPS: 50.0000 mg | ORAL_CAPSULE | Freq: Two times a day (BID) | ORAL | 3 refills | Status: DC

## 2021-10-06 NOTE — Assessment & Plan Note (Signed)
Pleasant 41 year old female, previously healthy, she has widespread history of muscle aches, trapezius, shoulders, back, occasional paresthesias hands and feet, she has had an extensive rheumatoid work-up, including vitamins B12, D, lupus, everything unrevealing. X-rays of the spine unrevealing, spine looks also normal on the CT, brain MRI unrevealing, Cymbalta was marginally effective, gabapentin created excessive sedation, she had also tried amitriptyline, meloxicam, topiramate. We set her up for a nerve conduction and EMG with [facility], it sounds like the nerve conduction study was abnormal with a potential chronic inflammatory demyelinating polyneuropathy (I did have the results, this is from the patient and she will forward me the results), I also started Lyrica at the last visit, she has done okay with 25 mg twice daily with some improvement, we will go ahead and go up to 50 mg twice daily with 2-week virtual visit/phone calls for dose titration.

## 2021-10-06 NOTE — Progress Notes (Signed)
   Virtual Visit via WebEx/MyChart   I connected with  Michele Cohen  on 10/06/21 via WebEx/MyChart/Doximity Video and verified that I am speaking with the correct person using two identifiers.   I discussed the limitations, risks, security and privacy concerns of performing an evaluation and management service by WebEx/MyChart/Doximity Video, including the higher likelihood of inaccurate diagnosis and treatment, and the availability of in person appointments.  We also discussed the likely need of an additional face to face encounter for complete and high quality delivery of care.  I also discussed with the patient that there may be a patient responsible charge related to this service. The patient expressed understanding and wishes to proceed.  Provider location is in medical facility. Patient location is at their home, different from provider location. People involved in care of the patient during this telehealth encounter were myself, my nurse/medical assistant, and my front office/scheduling team member.  Review of Systems: No fevers, chills, night sweats, weight loss, chest pain, or shortness of breath.   Objective Findings:    General: Speaking full sentences, no audible heavy breathing.  Sounds alert and appropriately interactive.  Appears well.  Face symmetric.  Extraocular movements intact.  Pupils equal and round.  No nasal flaring or accessory muscle use visualized.  Independent interpretation of tests performed by another provider:   None.  Brief History, Exam, Impression, and Recommendations:    Polyarthralgia Pleasant 41 year old female, previously healthy, she has widespread history of muscle aches, trapezius, shoulders, back, occasional paresthesias hands and feet, she has had an extensive rheumatoid work-up, including vitamins B12, D, lupus, everything unrevealing. X-rays of the spine unrevealing, spine looks also normal on the CT, brain MRI unrevealing, Cymbalta was  marginally effective, gabapentin created excessive sedation, she had also tried amitriptyline, meloxicam, topiramate. We set her up for a nerve conduction and EMG with [facility], it sounds like the nerve conduction study was abnormal with a potential chronic inflammatory demyelinating polyneuropathy (I did have the results, this is from the patient and she will forward me the results), I also started Lyrica at the last visit, she has done okay with 25 mg twice daily with some improvement, we will go ahead and go up to 50 mg twice daily with 2-week virtual visit/phone calls for dose titration.   I discussed the above assessment and treatment plan with the patient. The patient was provided an opportunity to ask questions and all were answered. The patient agreed with the plan and demonstrated an understanding of the instructions.   The patient was advised to call back or seek an in-person evaluation if the symptoms worsen or if the condition fails to improve as anticipated.   I provided 30 minutes of face to face and non-face-to-face time during this encounter date, time was needed to gather information, review chart, records, communicate/coordinate with staff remotely, as well as complete documentation.   ____________________________________________ Ihor Austin. Benjamin Stain, M.D., ABFM., CAQSM., AME. Primary Care and Sports Medicine [facility]  Adjunct Professor of Family Medicine  [place] [employer]  Restaurant manager, fast food

## 2021-10-08 LAB — B. BURGDORFI ANTIBODIES BY WB

## 2021-10-18 ENCOUNTER — Ambulatory Visit: Payer: No Typology Code available for payment source | Admitting: Neurology

## 2021-10-20 ENCOUNTER — Telehealth (INDEPENDENT_AMBULATORY_CARE_PROVIDER_SITE_OTHER): Payer: No Typology Code available for payment source | Admitting: Sports Medicine

## 2021-10-20 DIAGNOSIS — M797 Fibromyalgia: Secondary | ICD-10-CM | POA: Diagnosis not present

## 2021-10-20 DIAGNOSIS — G5603 Carpal tunnel syndrome, bilateral upper limbs: Secondary | ICD-10-CM

## 2021-10-20 NOTE — Assessment & Plan Note (Signed)
This pleasant 41 year old female returns, she has a widespread history of muscle aches in the trapezius, shoulders, back and occasional paresthesias in the hands and feet. She had an extensive rheumatoid work-up including vitamin B12, D, lupus, everything was unrevealing. X-rays of the spine were unrevealing, she also had a cervical spine MRI that was completely negative, brain MRI completely negative. Cymbalta marginally effective and gabapentin created excessive sedation. She also did not respond sufficiently well to amitriptyline or meloxicam, topiramate. We added Lyrica and she is now up to 50 mg twice daily with good improvement in symptoms. We also obtained a nerve conduction and EMG that showed bilateral carpal tunnel syndrome as well as mild multilevel cervical and lumbosacral radiculopathy. I explained to her that her neck and shoulder symptoms could not be explained by her cervical spine MRI and that this was likely related to by fascial pain syndrome/fibromyalgia and would be treated with Lyrica and therapy.  She does have some lumbosacral symptoms as well but I do not have a lumbosacral MRI yet. I did explain to her that we would try to gain control over her axial symptoms with Lyrica for now, and if insufficient improvement we will proceed with an MRI of the lumbar spine for interventional planning. She would like another month on Lyrica 50 mg twice a day before going up to 75 twice daily.

## 2021-10-20 NOTE — Assessment & Plan Note (Addendum)
At this point I think we have a good explanation for her hand tightness, particularly at night and on activity, her nerve conduction study did show bilateral median neuropathy at the wrist consistent with carpal tunnel syndrome. She is not yet ready to consider carpal tunnel/median nerve hydrodissection with ultrasound guidance, we will continue with stretches and Lyrica, if insufficient improvement we will proceed with bilateral median nerve hydrodissections.

## 2021-10-20 NOTE — Progress Notes (Signed)
Virtual Visit via Telephone   I connected with  Michele Cohen  on 10/20/21 by telephone/telehealth and verified that I am speaking with the correct person using two identifiers.   I discussed the limitations, risks, security and privacy concerns of performing an evaluation and management service by telephone, including the higher likelihood of inaccurate diagnosis and treatment, and the availability of in person appointments.  We also discussed the likely need of an additional face to face encounter for complete and high quality delivery of care.  I also discussed with the patient that there may be a patient responsible charge related to this service. The patient expressed understanding and wishes to proceed.  Provider location is in medical facility. Patient location is at their home, different from provider location. People involved in care of the patient during this telehealth encounter were myself, my nurse/medical assistant, and my front office/scheduling team member.  Review of Systems: No fevers, chills, night sweats, weight loss, chest pain, or shortness of breath.   Objective Findings:    General: Speaking full sentences, no audible heavy breathing.  Sounds alert and appropriately interactive.    Independent interpretation of tests performed by another provider:   None.  Brief History, Exam, Impression, and Recommendations:    Fibromyalgia This pleasant 41 year old female returns, she has a widespread history of muscle aches in the trapezius, shoulders, back and occasional paresthesias in the hands and feet. She had an extensive rheumatoid work-up including vitamin B12, D, lupus, everything was unrevealing. X-rays of the spine were unrevealing, she also had a cervical spine MRI that was completely negative, brain MRI completely negative. Cymbalta marginally effective and gabapentin created excessive sedation. She also did not respond sufficiently well to amitriptyline or  meloxicam, topiramate. We added Lyrica and she is now up to 50 mg twice daily with good improvement in symptoms. We also obtained a nerve conduction and EMG that showed bilateral carpal tunnel syndrome as well as mild multilevel cervical and lumbosacral radiculopathy. I explained to her that her neck and shoulder symptoms could not be explained by her cervical spine MRI and that this was likely related to by fascial pain syndrome/fibromyalgia and would be treated with Lyrica and therapy.  She does have some lumbosacral symptoms as well but I do not have a lumbosacral MRI yet. I did explain to her that we would try to gain control over her axial symptoms with Lyrica for now, and if insufficient improvement we will proceed with an MRI of the lumbar spine for interventional planning. She would like another month on Lyrica 50 mg twice a day before going up to 75 twice daily.  Bilateral carpal tunnel syndrome At this point I think we have a good explanation for her hand tightness, particularly at night and on activity, her nerve conduction study did show bilateral median neuropathy at the wrist consistent with carpal tunnel syndrome. She is not yet ready to consider carpal tunnel/median nerve hydrodissection with ultrasound guidance, we will continue with stretches and Lyrica, if insufficient improvement we will proceed with bilateral median nerve hydrodissections.   I discussed the above assessment and treatment plan with the patient. The patient was provided an opportunity to ask questions and all were answered. The patient agreed with the plan and demonstrated an understanding of the instructions.   The patient was advised to call back or seek an in-person evaluation if the symptoms worsen or if the condition fails to improve as anticipated.   I provided 30 minutes of  verbal and non-verbal time during this encounter date, time was needed to gather information, review chart, records,  communicate/coordinate with staff remotely, as well as complete documentation.   ____________________________________________ Ihor Austin. Benjamin Stain, M.D., ABFM., CAQSM., AME. Primary Care and Sports Medicine [facility]  Adjunct Professor of Family Medicine  [place] [employer]  Restaurant manager, fast food

## 2021-11-08 ENCOUNTER — Other Ambulatory Visit: Payer: Self-pay | Admitting: General Surgery

## 2021-11-08 DIAGNOSIS — Z1231 Encounter for screening mammogram for malignant neoplasm of breast: Secondary | ICD-10-CM

## 2021-11-18 ENCOUNTER — Ambulatory Visit (HOSPITAL_BASED_OUTPATIENT_CLINIC_OR_DEPARTMENT_OTHER): Payer: No Typology Code available for payment source

## 2021-11-18 ENCOUNTER — Ambulatory Visit
Admission: EM | Admit: 2021-11-18 | Discharge: 2021-11-18 | Disposition: A | Discharge status: Home / Self Care | Payer: No Typology Code available for payment source | Attending: Family Medicine | Admitting: Family Medicine

## 2021-11-18 ENCOUNTER — Ambulatory Visit (INDEPENDENT_AMBULATORY_CARE_PROVIDER_SITE_OTHER): Payer: No Typology Code available for payment source

## 2021-11-18 DIAGNOSIS — R1031 Right lower quadrant pain: Secondary | ICD-10-CM | POA: Diagnosis not present

## 2021-11-18 DIAGNOSIS — R110 Nausea: Secondary | ICD-10-CM

## 2021-11-18 DIAGNOSIS — N200 Calculus of kidney: Secondary | ICD-10-CM | POA: Diagnosis not present

## 2021-11-18 DIAGNOSIS — R1030 Lower abdominal pain, unspecified: Secondary | ICD-10-CM

## 2021-11-18 LAB — POCT URINALYSIS DIP (MANUAL ENTRY)
Bilirubin, UA: NEGATIVE
Blood, UA: NEGATIVE
Glucose, UA: NEGATIVE mg/dL
Leukocytes, UA: NEGATIVE
Nitrite, UA: NEGATIVE
Protein Ur, POC: NEGATIVE mg/dL
Spec Grav, UA: 1.01 (ref 1.010–1.025)
Urobilinogen, UA: 0.2 E.U./dL
pH, UA: 6 (ref 5.0–8.0)

## 2021-11-18 MED ORDER — OXYCODONE-ACETAMINOPHEN 5-325 MG PO TABS: 1.0000 | ORAL_TABLET | Freq: Three times a day (TID) | ORAL | 0 refills | Status: AC | PRN

## 2021-11-18 MED ORDER — ONDANSETRON 8 MG PO TBDP: 8.0000 mg | ORAL_TABLET | Freq: Three times a day (TID) | ORAL | 0 refills | Status: DC | PRN

## 2021-11-18 NOTE — Discharge Instructions (Addendum)
Patient advised of CT renal stone study with hard copy provided advised patient may take Percocet daily, as needed for abdominal pain.  Advised may use Zofran daily as needed for nausea.  Encouraged patient to increase daily water intake to 64 ounces per day and to increase daily fiber intake to avoid constipation while taking pain medication.  Advised if symptoms worsen and/or unresolved please follow-up with PCP, GI or here for further evaluation.

## 2021-11-18 NOTE — ED Triage Notes (Addendum)
Pt c/o abd pain since this morning. Has had back pain x 1 week as well. Pain radiates from belly button to RT flank. Some urinary pressure. Hx of kidney stones.

## 2021-11-18 NOTE — ED Provider Notes (Signed)
[name]    CSN: [id number] Arrival date & time: 11/18/21  1301      History   Chief Complaint Chief Complaint  Patient presents with   Abdominal Pain    Lower    HPI Michele Cohen is a 41 y.o. female.   HPI 41 year old female presents with bilateral lower abdominal pain since this morning.  Reports has back pain as well for 1 week.  Reports pain radiates from bellybutton to right flank with some urinary pressure.  Reports history of kidney stones.  Patient was evaluated at ED in October 2022 for similar symptoms CT renal stone study revealed extensive colonic diverticulosis with very mild inflammation along short segment sigmoid colon may represent mild acute diverticulitis.  No evidence of perforation or abscess formation.  Punctuate nonobstructive bilateral renal stones nonobstructive ureteral or bladder calculi were visualized.  PMH significant for fibromyalgia, PE, and headache.  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   BRBPR (bright red blood per rectum) 03/13/2017   Dysuria 03/13/2017   Headache 03/13/2017   Pulmonary embolus ([place]) 03/13/2017   S/P hysterectomy 03/13/2017   Septic thrombophlebitis 03/13/2017   Shortness of breath 03/13/2017   Vaginitis 03/13/2017   Breast lump 07/20/2015   Dense breasts 07/20/2015   Family history of breast cancer 07/20/2015    Past Surgical History:  Procedure Laterality Date   ABDOMINAL HYSTERECTOMY     BREAST CYST EXCISION     COLONOSCOPY  04/14/2020   2020 and 2005   fallopian tube      removal     UPPER GASTROINTESTINAL ENDOSCOPY  04/14/2020   2006    OB History   No obstetric history on file.      Home Medications    Prior to Admission medications   Medication Sig Start Date End Date Taking? Authorizing Provider  ondansetron (ZOFRAN-ODT) 8 MG disintegrating tablet Take 1 tablet (8 mg total) by mouth every 8 (eight) hours as needed for nausea or vomiting. 11/18/21  Yes Eliezer Lofts, FNP  oxyCODONE-acetaminophen (PERCOCET/ROXICET) 5-325 MG tablet Take 1 tablet by mouth every 8 (eight) hours as needed for up to 8 days for severe pain. 11/18/21 11/26/21 Yes Eliezer Lofts, FNP  pregabalin (LYRICA) 50 MG capsule Take 1 capsule (50 mg total) by mouth 2 (two) times daily. 10/06/21   Silverio Decamp, MD    Family History Family History  Problem Relation Age of Onset   Breast cancer Mother    Heart attack Mother    Diabetes Mother    Hypertension Mother    Heart failure Father    Diabetes Father    Hypertension Father    Pancreatic cancer Maternal Grandfather    Colon cancer Paternal Grandfather 54       70s ?   Colon polyps Paternal Grandfather    Esophageal cancer Neg Hx    Rectal cancer Neg Hx    Stomach cancer Neg Hx     Social History Social History   Tobacco Use  Smoking status: Never   Smokeless tobacco: Never  Vaping Use   Vaping Use: Never used  Substance Use Topics   Alcohol use: No    Comment: occasional   Drug use: Never     Allergies   Aztreonam, Phenazopyridine, Shellfish allergy, Zolpidem, and Zolpidem tartrate   Review of Systems Review of Systems  Gastrointestinal:  Positive for abdominal pain and vomiting.  Musculoskeletal:  Positive for back pain.  All other systems reviewed and are negative.    Physical Exam Triage Vital Signs ED Triage Vitals  Enc Vitals Group     BP 11/18/21 1344 119/78     Pulse Rate 11/18/21 1344 88     Resp 11/18/21 1344 17     Temp 11/18/21 1344 98.5 F (36.9 C)     Temp Source 11/18/21 1344  Oral     SpO2 11/18/21 1344 97 %     Weight --      Height --      Head Circumference --      Peak Flow --      Pain Score 11/18/21 1343 7     Pain Loc --      Pain Edu? --      Excl. in [place]? --    No data found.  Updated Vital Signs BP 119/78 (BP Location: Right Arm)   Pulse 88   Temp 98.5 F (36.9 C) (Oral)   Resp 17   LMP 12/15/2011   SpO2 97%    Physical Exam Vitals and nursing note reviewed.  Constitutional:      General: She is not in acute distress.    Appearance: She is well-developed and normal weight. She is not ill-appearing.  HENT:     Head: Normocephalic and atraumatic.     Mouth/Throat:     Mouth: Mucous membranes are moist.     Pharynx: Oropharynx is clear.  Eyes:     Extraocular Movements: Extraocular movements intact.     Pupils: Pupils are equal, round, and reactive to light.  Cardiovascular:     Rate and Rhythm: Normal rate and regular rhythm.     Heart sounds: Normal heart sounds. No murmur heard. Pulmonary:     Effort: Pulmonary effort is normal.     Breath sounds: Normal breath sounds. No wheezing, rhonchi or rales.  Abdominal:     General: Bowel sounds are absent. There is no distension.     Palpations: Abdomen is soft. There is no shifting dullness, fluid wave, hepatomegaly, splenomegaly, mass or pulsatile mass.     Tenderness: There is abdominal tenderness in the right lower quadrant, suprapubic area and left lower quadrant. There is no right CVA tenderness, guarding or rebound. Negative signs include Murphy's sign, Rovsing's sign and McBurney's sign.     Hernia: No hernia is present.  Skin:    General: Skin is warm and dry.  Neurological:     General: No focal deficit present.     Mental Status: She is alert and oriented to person, place, and time.      UC Treatments / Results  Labs (all labs ordered are listed, but only abnormal results are displayed) Labs Reviewed  POCT URINALYSIS DIP (MANUAL ENTRY) - Abnormal; Notable for the  following components:      Result Value   Ketones, POC UA trace (5) (*)    All other components within normal limits    EKG   Radiology CT Renal Stone Study  Result Date: 11/18/2021 CLINICAL  DATA:  Right flank pain since this morning, kidney stones suspected EXAM: CT ABDOMEN AND PELVIS WITHOUT CONTRAST TECHNIQUE: Multidetector CT imaging of the abdomen and pelvis was performed following the standard protocol without IV contrast. RADIATION DOSE REDUCTION: This exam was performed according to the departmental dose-optimization program which includes automated exposure control, adjustment of the mA and/or kV according to patient size and/or use of iterative reconstruction technique. COMPARISON:  07/16/2021 FINDINGS: Lower chest: No acute abnormality. Hepatobiliary: No solid liver abnormality is seen. No gallstones, gallbladder wall thickening, or biliary dilatation. Pancreas: Unremarkable. No pancreatic ductal dilatation or surrounding inflammatory changes. Spleen: Normal in size without significant abnormality. Adrenals/Urinary Tract: Adrenal glands are unremarkable. Punctuate bilateral nonobstructive renal calculi. Bladder is unremarkable. Stomach/Bowel: Stomach is within normal limits. Appendix appears normal. No evidence of bowel wall thickening, distention, or inflammatory changes. Pancolonic diverticulosis. Vascular/Lymphatic: No significant vascular findings are present. No enlarged abdominal or pelvic lymph nodes. Reproductive: Status post hysterectomy. Other: No abdominal wall hernia or abnormality. No ascites. Musculoskeletal: No acute or significant osseous findings. IMPRESSION: 1. Punctuate bilateral nonobstructive renal calculi. No ureteral calculi or hydronephrosis. 2. Pancolonic diverticulosis without evidence of acute diverticulitis. Electronically Signed   By: Delanna Ahmadi M.D.   On: 11/18/2021 14:52    Procedures Procedures (including critical care time)  Medications Ordered in  UC Medications - No data to display  Initial Impression / Assessment and Plan / UC Course  I have reviewed the triage vital signs and the nursing notes.  Pertinent labs & imaging results that were available during my care of the patient were reviewed by me and considered in my medical decision making (see chart for details).     MDM: 1.  Lower abdominal pain-CT renal stone study revealed above; 2.  Bilateral nephrolithiasis-Rx'd Percocet; 3.  Nausea-Rx'd Zofran. Patient advised of CT renal stone study with hard copy provided advised patient may take Percocet daily, as needed for abdominal pain.  Advised may use Zofran daily as needed for nausea.  Encouraged patient to increase daily water intake to 64 ounces per day and to increase daily fiber intake to avoid constipation while taking pain medication.  Advised if symptoms worsen and/or unresolved please follow-up with PCP, GI or here for further evaluation. Final Clinical Impressions(s) / UC Diagnoses   Final diagnoses:  Lower abdominal pain  Bilateral nephrolithiasis  Nausea     Discharge Instructions      Patient advised of CT renal stone study with hard copy provided advised patient may take Percocet daily, as needed for abdominal pain.  Advised may use Zofran daily as needed for nausea.  Encouraged patient to increase daily water intake to 64 ounces per day and to increase daily fiber intake to avoid constipation while taking pain medication.  Advised if symptoms worsen and/or unresolved please follow-up with PCP, GI or here for further evaluation.     ED Prescriptions     Medication Sig Dispense Auth. Provider   oxyCODONE-acetaminophen (PERCOCET/ROXICET) 5-325 MG tablet Take 1 tablet by mouth every 8 (eight) hours as needed for up to 8 days for severe pain. 24 tablet Eliezer Lofts, FNP   ondansetron (ZOFRAN-ODT) 8 MG disintegrating tablet Take 1 tablet (8 mg total) by mouth every 8 (eight) hours as needed for nausea or vomiting.  24 tablet Eliezer Lofts, FNP      I have reviewed the PDMP during this encounter.   Eliezer Lofts, [place] 11/18/21 1515

## 2021-12-02 ENCOUNTER — Telehealth: Payer: Self-pay | Admitting: Gastroenterology

## 2021-12-02 NOTE — Telephone Encounter (Addendum)
Called patient to schedule appt requested on MyChart but did not answer, left patient a vm to call back to schedule.

## 2021-12-15 ENCOUNTER — Ambulatory Visit: Payer: No Typology Code available for payment source

## 2021-12-29 ENCOUNTER — Ambulatory Visit: Payer: No Typology Code available for payment source | Admitting: Medical-Surgical

## 2022-03-16 ENCOUNTER — Ambulatory Visit (INDEPENDENT_AMBULATORY_CARE_PROVIDER_SITE_OTHER): Payer: No Typology Code available for payment source | Admitting: Gastroenterology

## 2022-03-16 ENCOUNTER — Encounter: Payer: Self-pay | Admitting: Gastroenterology

## 2022-03-16 VITALS — BP 110/72 | HR 97 | Ht 62.0 in | Wt 153.0 lb

## 2022-03-16 DIAGNOSIS — R140 Abdominal distension (gaseous): Secondary | ICD-10-CM | POA: Diagnosis not present

## 2022-03-16 DIAGNOSIS — K5909 Other constipation: Secondary | ICD-10-CM | POA: Diagnosis not present

## 2022-03-16 DIAGNOSIS — R1030 Lower abdominal pain, unspecified: Secondary | ICD-10-CM

## 2022-03-16 NOTE — Patient Instructions (Addendum)
_______________________________________________________  If your blood pressure at your visit was 140/90 or greater, please contact your primary care physician to follow up on this.  _______________________________________________________  If you are age 44 or older, your body mass index should be between 23-30. Your Body mass index is 27.98 kg/m. If this is out of the aforementioned range listed, please consider follow up with your Primary Care Provider.  If you are age 46 or younger, your body mass index should be between 19-25. Your Body mass index is 27.98 kg/m. If this is out of the aformentioned range listed, please consider follow up with your Primary Care Provider.   ________________________________________________________  The [facility] providers would like to encourage you to use [facility] to communicate with providers for non-urgent requests or questions.  Due to long hold times on the telephone, sending your provider a message by [facility] may be a faster and more efficient way to get a response.  Please allow 48 business hours for a response.  Please remember that this is for non-urgent requests.  _______________________________________________________  Michele Cohen have been scheduled to have an anorectal manometry at [facility] on 08-10-2022 at 10:30am. Please arrive 30 minutes prior to your appointment time for registration (1st floor of the hospital-admissions).  Please make certain to use 1 Fleets enema 2 hours prior to coming for your appointment. You can purchase Fleets enemas from the laxative section at your drug store. You should not eat anything during the two hours prior to the procedure. You may take regular medications with small sips of water at least 2 hours prior to the study.  Anorectal manometry is a test performed to evaluate patients with constipation or fecal incontinence. This test measures the pressures of the anal sphincter muscles, the sensation in the rectum,  and the neural reflexes that are needed for normal bowel movements.  THE PROCEDURE The test takes approximately 30 minutes to 1 hour. You will be asked to change into a hospital gown. A technician or nurse will explain the procedure to you, take a brief health history, and answer any questions you may have. The patient then lies on his or her left side. A small, flexible tube, about the size of a thermometer, with a balloon at the end is inserted into the rectum. The catheter is connected to a machine that measures the pressure. During the test, the small balloon attached to the catheter may be inflated in the rectum to assess the normal reflex pathways. The nurse or technician may also ask the person to squeeze, relax, and push at various times. The anal sphincter muscle pressures are measured during each of these maneuvers. To squeeze, the patient tightens the sphincter muscles as if trying to prevent anything from coming out. To push or bear down, the patient strains down as if trying to have a bowel movement.   It was a pleasure to see you today!  Thank you for trusting me with your gastrointestinal care!

## 2022-03-16 NOTE — Progress Notes (Signed)
[facility] progress note:  History: Michele Cohen 03/16/2022  Referring provider: Azzie Glatter, FNP  Reason for consult/chief complaint: Constipation (Pt is having changes in her BM, pt states one day it is pasty and the next day she is extremely constipated ) and Abdominal Pain (Pt is having problems with abd pain, pt been having problems for about 2 months)   Subjective  HPI: From my office consult note of February 2022: "Multiple chronic digestive symptoms, many elements of which suggest a functional bowel disorder.  She apparently had a positive test for SIBO, and after receiving antibiotics she feels that the occasional episodes of diarrhea improved, but not much else.  It does not sound like she has had anorectal manometry. Persistent nausea suggesting upper digestive condition, although perhaps ancillary symptoms of IBS.  No prior upper endoscopy.  She says it was discussed with previous physician, but they had not gotten to doing it.   Plan:   Upper endoscopy and colonoscopy.  She was agreeable after discussion of procedure and risks.             The benefits and risks of the planned procedure were described in detail with the patient or (when appropriate) their health care proxy.  Risks were outlined as including, but not limited to, bleeding, infection, perforation, adverse medication reaction leading to cardiac or pulmonary decompensation, pancreatitis (if ERCP).  The limitation of incomplete mucosal visualization was also discussed.  No guarantees or warranties were given.   Depending on findings, possible repeat of SIBO breath testing afterward. Consider anorectal manometry.   If ultimately most consistent with IBS, we can discuss a trial of tegaserod or prucalopride. Would have to start cautiously with the dosing.  She does have a bowel movement daily, but it sounds like she gets incompletely evacuated."  04/14/2020: Colonoscopy normal except  diverticulosis, EGD normal.  Anorectal manometry recommended.  ARM was scheduled for 07/31/2020, however was canceled (unclear reason) ____________________________  [facility] April 2023 for lower abdominal and pelvic pain from reported pelvic floor dysfunction.  She also has dyspareunia noted in pelvic floor therapy note of 08/06/2021.  Michele Cohen is bothered by ongoing constipation where she may not go for 2 or 3 days, then the stool is pasty and thin.  She still has abdominal bloating and crampy mid to lower abdominal discomfort.  Things have not bothered her more in recent months, and she is bothered by urinary and gynecologic symptom.  In addition to gynecology, she reports having seen urology and was told she might have interstitial cystitis and was prescribed some medicine to try.  (No-show for clinic visit.here 09/22/21) ROS:  Review of Systems  Denies chest pain or dyspnea  Past Medical History: Past Medical History:  Diagnosis Date   Anemia    Concussion    Diverticulosis    Headache disorder    History of benign breast tumor    Lumbar radiculopathy    Pulmonary embolism ([place]) 2019   Renal cyst    Stress headaches      Past Surgical History: Past Surgical History:  Procedure Laterality Date   ABDOMINAL HYSTERECTOMY     BREAST CYST EXCISION     COLONOSCOPY  04/14/2020   2020 and 2005   fallopian tube      removal    UPPER GASTROINTESTINAL ENDOSCOPY  04/14/2020   2006     Family History: Family History  Problem Relation Age of Onset   Breast cancer Mother  Heart attack Mother    Diabetes Mother    Hypertension Mother    Heart failure Father    Diabetes Father    Hypertension Father    Pancreatic cancer Maternal Grandfather    Colon cancer Paternal Grandfather 50       70s ?   Colon polyps Paternal Grandfather    Esophageal cancer Neg Hx    Rectal cancer Neg Hx    Stomach cancer Neg Hx     Social History: Social History    Socioeconomic History   Marital status: Married    Spouse name: Not on file   Number of children: Not on file   Years of education: Not on file   Highest education level: Not on file  Occupational History   Not on file  Tobacco Use   Smoking status: Never   Smokeless tobacco: Never  Vaping Use   Vaping Use: Never used  Substance and Sexual Activity   Alcohol use: No    Comment: occasional   Drug use: Never   Sexual activity: Yes    Birth control/protection: None  Other Topics Concern   Not on file  Social History Narrative   Not on file   Social Determinants of Health   Financial Resource Strain: Not on file  Food Insecurity: Not on file  Transportation Needs: Not on file  Physical Activity: Not on file  Stress: Not on file  Social Connections: Not on file    Allergies: Allergies  Allergen Reactions   Aztreonam Anaphylaxis   Phenazopyridine Anaphylaxis    itching   Shellfish Allergy Anaphylaxis   Zolpidem Hives, Itching and Rash    Ambien   Zolpidem Tartrate Itching    Outpatient Meds: Current Outpatient Medications  Medication Sig Dispense Refill   NON FORMULARY Pt taking metimurcil  3 times a week     ondansetron (ZOFRAN-ODT) 8 MG disintegrating tablet Take 1 tablet (8 mg total) by mouth every 8 (eight) hours as needed for nausea or vomiting. (Patient not taking: Reported on 03/16/2022) 24 tablet 0   pregabalin (LYRICA) 50 MG capsule Take 1 capsule (50 mg total) by mouth 2 (two) times daily. (Patient not taking: Reported on 03/16/2022) 30 capsule 3   No current facility-administered medications for this visit.      ___________________________________________________________________ Objective   Exam:  BP 110/72   Pulse 97   Ht 5\' 2"  (1.575 m)   Wt 153 lb (69.4 kg)   LMP 12/15/2011   BMI 27.98 kg/m  Wt Readings from Last 3 Encounters:  03/16/22 153 lb (69.4 kg)  09/28/21 150 lb (68 kg)  08/04/21 150 lb 12.8 oz (68.4 kg)    General: Very  pleasant, well-appearing CV: Regular with appreciable murmur, no JVD, no peripheral edema Resp: clear to auscultation bilaterally, normal RR and effort noted GI: soft, no focal tenderness, with active bowel sounds and no bruit. No guarding or palpable organomegaly noted. Skin; warm and dry, no rash or jaundice noted Neuro: awake, alert and oriented x 3. Normal gross motor function and fluent speech  Labs:  No recent data for review Assessment: Encounter Diagnoses  Name Primary?   Lower abdominal pain Yes   Abdominal bloating    Chronic constipation     I suspect these are functional symptoms related to her pelvic floor dysfunction.  She has not had dedicated ARM or specific biofeedback for the types of dyssynergy defecation or other abnormalities that may be discovered on such testing. I recommend  she get scheduled again for that, and she wished to proceed.  We also discussed trying different medicines directed constipation such as Linzess or Motegrity, with the understanding that they may be of limited help given the underlying nature of symptoms, and also how she sometimes might have change in the bowel habits with urgency and softer stools.  She would prefer to hold off on any medicines for now.  Plan:  Anorectal manometry scheduled.  Likely months from now due to current hospital-based endoscopy department staffing.  Thank you for the courtesy of this consult.  Please call me with any questions or concerns.  Nelida Meuse III  CC: Referring provider noted above

## 2022-03-29 NOTE — Progress Notes (Deleted)
No chief complaint on file.     ASSESSMENT AND PLAN  Michele Cohen is a 42 y.o. female   Chronic neck pain, radiating pain to bilateral shoulder New onset fasciculation, intermittent body achy pain  Brisk reflex on examinations, patient is very much worried about her symptoms, desire further evaluation,  MRI of the cervical spine to rule out cervical spondylitic myelopathy  Personally reviewed MRI of the brain with and without contrast in July 2023 that was normal  Extensive laboratory evaluations in 2023 showed no significant abnormalities  Encouraged her to use Cymbalta 60 mg daily as prescribed by previous provider, Lyrica as needed for muscle fasciculations   DIAGNOSTIC DATA (LABS, IMAGING, TESTING) - I reviewed patient records, labs, notes, testing and imaging myself where available.  MRI cervical spine 10/06/2021 unremarkable  August 23 2021: Normal MRI of the brain CT of abdomen, pelvic showed no acute process, small hepatic hemangioma, right renal calculus  Laboratory evaluation, negative rheumatoid factor, HLA-B27, uric acid, CBC, iron panel, magnesium, copper, zinc, hepatitis C, HIV, ANA, ESR, CPK, elevation of high-sensitivity C-reactive protein 5.20,  MEDICAL HISTORY:  Update 03/30/2022 Michele: Patient returns for 7-monthfollow-up.   She is currently taking pregabalin 50 mg twice daily through PCP. She has tried multiple medications through PCP including gabapentin (caused sedation), duloxetine (marginally effective), amitriptyline, meloxicam and topiramate.     Consult visit 09/28/2021 Dr. YKrista Cohen SNeka Odegaardis a 42year old female, seen in request by her primary care nurse practitioner JSamuel Cohen for  elevation of muscle fasciculation, initial evaluation was on September 28, 2021   I reviewed and summarized the referring note PMHX. PE in 2019, following hysterectomy  She had long history of heavy menstrual bleeding, eventually required hysterectomy in  2019, following that, she suffered pulmonary emboli, she is no longer on anticoagulation treatment  She had a history of migraines since age [age]s typical migraine retro-orbital area severe pounding headache with light noise sensitivity, nauseous lasting for few hours, sleep always helps, she rarely has migraine headaches  She exercises couple times a week treadmill for 30 minutes, mild weightlifting without any difficulty  Around June 2023, she began to notice intermittent muscle twitching, gradually spreading to different body part, lasting for few seconds, no muscle weakness, no atrophy, she has intermittent neck pain, radiating pain to bilateral shoulder  She did Google search her symptoms, very concerned about the possibility of ALS  She also has intermittent bilateral hands paresthesia  She had EMG nerve conduction study by outside neurologist, which showed evidence of 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  There was also evidence of carpal tunnel syndromes,  She was given Lyrica low-dose does help her muscle fasciculation,  She had long history of intermittent muscle aching pain, fatigue, was given Cymbalta, has not tried it yet    PHYSICAL EXAM:   There were no vitals filed for this visit.  There is no height or weight on file to calculate BMI.  PHYSICAL EXAMNIATION:  Gen: NAD, conversant, well nourised, well groomed                     Cardiovascular: Regular rate rhythm, no peripheral edema, warm, nontender. Eyes: Conjunctivae clear without exudates or hemorrhage Neck: Supple, no carotid bruits. Pulmonary: Clear to auscultation bilaterally   NEUROLOGICAL EXAM:  MENTAL STATUS: Speech/cognition: Awake, alert, oriented to history taking and casual conversation CRANIAL NERVES: CN II: Visual fields are full to confrontation. Pupils are round equal and  briskly reactive to light. CN III, IV, VI: extraocular movement are normal. No  ptosis. CN V: Facial sensation is intact to light touch CN VII: Face is symmetric with normal eye closure  CN VIII: Hearing is normal to causal conversation. CN IX, X: Phonation is normal. CN XI: Head turning and shoulder shrug are intact  MOTOR: There is no pronator drift of out-stretched arms. Muscle bulk and tone are normal. Muscle strength is normal.  REFLEXES: Reflexes are 2+ and symmetric at the biceps, triceps, knees, and ankles. Plantar responses are flexor.  SENSORY: Intact to light touch, pinprick and vibratory sensation are intact in fingers and toes.  COORDINATION: There is no trunk or limb dysmetria noted.  GAIT/STANCE: Posture is normal. Gait is steady with normal steps, base, arm swing, and turning. Heel and toe walking are normal. Tandem gait is normal.  Romberg is absent.  REVIEW OF SYSTEMS:  Full 14 system review of systems performed and notable only for as above All other review of systems were negative.   ALLERGIES: Allergies  Allergen Reactions   Aztreonam Anaphylaxis   Phenazopyridine Anaphylaxis    itching   Shellfish Allergy Anaphylaxis   Zolpidem Hives, Itching and Rash    Ambien   Zolpidem Tartrate Itching    HOME MEDICATIONS: Current Outpatient Medications  Medication Sig Dispense Refill   NON FORMULARY Pt taking metimurcil  3 times a week     ondansetron (ZOFRAN-ODT) 8 MG disintegrating tablet Take 1 tablet (8 mg total) by mouth every 8 (eight) hours as needed for nausea or vomiting. (Patient not taking: Reported on 03/16/2022) 24 tablet 0   pregabalin (LYRICA) 50 MG capsule Take 1 capsule (50 mg total) by mouth 2 (two) times daily. (Patient not taking: Reported on 03/16/2022) 30 capsule 3   No current facility-administered medications for this visit.    PAST MEDICAL HISTORY: Past Medical History:  Diagnosis Date   Anemia    Concussion    Diverticulosis    Headache disorder    History of benign breast tumor    Lumbar radiculopathy     Pulmonary embolism ([place]) 2019   Renal cyst    Stress headaches     PAST SURGICAL HISTORY: Past Surgical History:  Procedure Laterality Date   ABDOMINAL HYSTERECTOMY     BREAST CYST EXCISION     COLONOSCOPY  04/14/2020   2020 and 2005   fallopian tube      removal    UPPER GASTROINTESTINAL ENDOSCOPY  04/14/2020   2006    FAMILY HISTORY: Family History  Problem Relation Age of Onset   Breast cancer Mother    Heart attack Mother    Diabetes Mother    Hypertension Mother    Heart failure Father    Diabetes Father    Hypertension Father    Pancreatic cancer Maternal Grandfather    Colon cancer Paternal Grandfather 52       70s ?   Colon polyps Paternal Grandfather    Esophageal cancer Neg Hx    Rectal cancer Neg Hx    Stomach cancer Neg Hx     SOCIAL HISTORY: Social History   Socioeconomic History   Marital status: Married    Spouse name: Not on file   Number of children: Not on file   Years of education: Not on file   Highest education level: Not on file  Occupational History   Not on file  Tobacco Use   Smoking status: Never   Smokeless tobacco:  Never  Vaping Use   Vaping Use: Never used  Substance and Sexual Activity   Alcohol use: No    Comment: occasional   Drug use: Never   Sexual activity: Yes    Birth control/protection: None  Other Topics Concern   Not on file  Social History Narrative   Not on file   Social Determinants of Health   Financial Resource Strain: Not on file  Food Insecurity: Not on file  Transportation Needs: Not on file  Physical Activity: Not on file  Stress: Not on file  Social Connections: Not on file  Intimate Partner Violence: Not on file      I spent *** minutes of face-to-face and non-face-to-face time with patient.  This included previsit chart review, lab review, study review, order entry, electronic health record documentation, patient education and discussion regarding above diagnoses and treatment plan and  answered all other questions to patient's satisfaction  Frann Rider, [facility]  [facility] [address] [place], [place] [address]  Phone [phone] Fax [phone] Note: This document was prepared with digital dictation and possible smart phrase technology. Any transcriptional errors that result from this process are unintentional.

## 2022-03-30 ENCOUNTER — Encounter: Payer: Self-pay | Admitting: Adult Health

## 2022-03-30 ENCOUNTER — Ambulatory Visit: Payer: No Typology Code available for payment source | Admitting: Adult Health

## 2022-05-13 ENCOUNTER — Other Ambulatory Visit: Payer: Self-pay | Admitting: General Surgery

## 2022-05-13 DIAGNOSIS — Z1239 Encounter for other screening for malignant neoplasm of breast: Secondary | ICD-10-CM

## 2022-05-13 DIAGNOSIS — Z803 Family history of malignant neoplasm of breast: Secondary | ICD-10-CM

## 2022-08-09 NOTE — Progress Notes (Signed)
Patient called to cancel Manometry for tomorrow 08/10/2022 due to not having money for the copay. Dr. Myrtie Neither aware

## 2022-08-10 ENCOUNTER — Ambulatory Visit (HOSPITAL_COMMUNITY)
Admission: RE | Admit: 2022-08-10 | Payer: No Typology Code available for payment source | Source: Home / Self Care | Admitting: Gastroenterology

## 2022-08-10 ENCOUNTER — Encounter (HOSPITAL_COMMUNITY): Admission: RE | Payer: Self-pay | Source: Home / Self Care

## 2022-08-10 SURGERY — MANOMETRY, ANORECTAL

## 2022-08-29 ENCOUNTER — Ambulatory Visit
Admission: RE | Admit: 2022-08-29 | Discharge: 2022-08-29 | Disposition: A | Discharge status: Home / Self Care | Payer: No Typology Code available for payment source | Source: Ambulatory Visit | Attending: General Surgery | Admitting: General Surgery

## 2022-08-29 DIAGNOSIS — Z1231 Encounter for screening mammogram for malignant neoplasm of breast: Secondary | ICD-10-CM

## 2022-11-24 ENCOUNTER — Ambulatory Visit: Payer: No Typology Code available for payment source

## 2022-11-24 ENCOUNTER — Ambulatory Visit
Admission: EM | Admit: 2022-11-24 | Discharge: 2022-11-24 | Disposition: A | Discharge status: Home / Self Care | Payer: No Typology Code available for payment source | Attending: Internal Medicine | Admitting: Internal Medicine

## 2022-11-24 DIAGNOSIS — G8929 Other chronic pain: Secondary | ICD-10-CM | POA: Diagnosis not present

## 2022-11-24 DIAGNOSIS — M25551 Pain in right hip: Secondary | ICD-10-CM | POA: Diagnosis not present

## 2022-11-24 DIAGNOSIS — M545 Low back pain, unspecified: Secondary | ICD-10-CM | POA: Diagnosis not present

## 2022-11-24 DIAGNOSIS — M5459 Other low back pain: Secondary | ICD-10-CM | POA: Diagnosis not present

## 2022-11-24 MED ORDER — CYCLOBENZAPRINE HCL 5 MG PO TABS: 5.0000 mg | ORAL_TABLET | Freq: Every evening | ORAL | 0 refills | Status: DC | PRN

## 2022-11-24 MED ORDER — PREDNISONE 20 MG PO TABS: ORAL_TABLET | ORAL | 0 refills | Status: DC

## 2022-11-24 NOTE — ED Provider Notes (Signed)
[facility]  Note:  This document was prepared using Conservation officer, historic buildings and may include unintentional dictation errors.  MRN: [id number] DOB: 01/12/1981  Subjective:   Michele Cohen is a 42 y.o. female presenting for 2 primary concerns.  1.  Reports acute on chronic severe low back pain on the right side, right lower hip pain.  No fall, trauma, numbness or tingling, saddle paresthesia, changes to bowel or urinary habits, radicular symptoms.  Has had extensive workup done for the back including MRI of the cervical region, thoracic region, x-rays.  She has tried to pursue an MRI of the lumbar region but has been unsuccessful.  No changes to bowel or urinary habits.  Patient would like to repeat an x-ray of the lumbar region. 2.  Reports 2-year history of persistent intermittent body pains, extremity pains, night sweats, easily bruising.  Has had extensive workup through her PCP practice.  No change in her symptoms.  No current facility-administered medications for this encounter.  Current Outpatient Medications:    NON FORMULARY, Pt taking metimurcil  3 times a week, Disp: , Rfl:    ondansetron (ZOFRAN-ODT) 8 MG disintegrating tablet, Take 1 tablet (8 mg total) by mouth every 8 (eight) hours as needed for nausea or vomiting. (Patient not taking: Reported on 03/16/2022), Disp: 24 tablet, Rfl: 0   pregabalin (LYRICA) 50 MG capsule, Take 1 capsule (50 mg total) by mouth 2 (two) times daily. (Patient not taking: Reported on 03/16/2022), Disp: 30 capsule, Rfl: 3   Allergies  Allergen Reactions   Aztreonam Anaphylaxis   Phenazopyridine Anaphylaxis, Hives and Rash    itching  itching    AZO - pyridium itching  itching, , AZO - pyridium itching   Shellfish Allergy Anaphylaxis   Zolpidem Hives, Itching and Rash    Ambien   Zolpidem Tartrate Itching    Past Medical History:  Diagnosis Date   Anemia    Concussion    Diverticulosis    Headache  disorder    History of benign breast tumor    Lumbar radiculopathy    Pulmonary embolism ([facility]) 2019   Renal cyst    Stress headaches      Past Surgical History:  Procedure Laterality Date   ABDOMINAL HYSTERECTOMY     BREAST BIOPSY Bilateral 06/2021   BREAST CYST EXCISION     COLONOSCOPY  04/14/2020   2020 and 2005   fallopian tube      removal    UPPER GASTROINTESTINAL ENDOSCOPY  04/14/2020   2006    Family History  Problem Relation Age of Onset   Breast cancer Mother    Heart attack Mother    Diabetes Mother    Hypertension Mother    Heart failure Father    Diabetes Father    Hypertension Father    Breast cancer Maternal Aunt    Pancreatic cancer Maternal Grandfather    Colon cancer Paternal Grandfather 42       50s ?   Colon polyps Paternal Grandfather    Breast cancer Cousin    Esophageal cancer Neg Hx    Rectal cancer Neg Hx    Stomach cancer Neg Hx     Social History   Tobacco Use   Smoking status: Never   Smokeless tobacco: Never  Vaping Use   Vaping status: Never Used  Substance Use Topics   Alcohol use: No    Comment: occasional   Drug use: Never    ROS  Objective:   Vitals: BP (!) 130/91 (BP Location: Left Arm)   Pulse (!) 114   Temp 98.8 F (37.1 C) (Oral)   Resp 16   LMP 12/15/2011   SpO2 98%   Physical Exam Constitutional:      General: She is not in acute distress.    Appearance: Normal appearance. She is well-developed. She is not ill-appearing, toxic-appearing or diaphoretic.  HENT:     Head: Normocephalic and atraumatic.     Nose: Nose normal.     Mouth/Throat:     Mouth: Mucous membranes are moist.  Eyes:     General: No scleral icterus.       Right eye: No discharge.        Left eye: No discharge.     Extraocular Movements: Extraocular movements intact.  Cardiovascular:     Rate and Rhythm: Normal rate.  Pulmonary:     Effort: Pulmonary effort is normal.  Abdominal:     Tenderness: There is no right CVA  tenderness or left CVA tenderness.  Musculoskeletal:     Lumbar back: Tenderness (over areas outlined) present. No swelling, edema, deformity, signs of trauma, lacerations, spasms or bony tenderness. Normal range of motion. Negative right straight leg raise test and negative left straight leg raise test. No scoliosis.       Back:  Skin:    General: Skin is warm and dry.  Neurological:     General: No focal deficit present.     Mental Status: She is alert and oriented to person, place, and time.  Psychiatric:        Mood and Affect: Mood normal.        Behavior: Behavior normal.    DG Lumbar Spine Complete  Result Date: 11/24/2022 CLINICAL DATA:  Chronic lower back pain. EXAM: LUMBAR SPINE - COMPLETE 4+ VIEW COMPARISON:  November 18, 2021. FINDINGS: There is no evidence of lumbar spine fracture. Alignment is normal. Intervertebral disc spaces are maintained. IMPRESSION: Negative. Electronically Signed   By: Lupita Raider M.D.   On: 11/24/2022 18:09     Assessment and Plan :   PDMP not reviewed this encounter.  1. Acute right-sided low back pain without sciatica   2. Right hip pain   3. Chronic right-sided low back pain without sciatica    Offered an oral prednisone course to address her severe acute on chronic inflammatory low back pain, right hip pain.  Recommended follow-up with an orthopedic practice.  Patient has had extensive workup done and therefore recommended that she continue to pursue workup through her PCP practice or a new PCP.  Counseled patient on potential for adverse effects with medications prescribed/recommended today, ER and return-to-clinic precautions discussed, patient verbalized understanding.    Wallis Bamberg, [place] 11/24/22 1610

## 2022-11-24 NOTE — ED Triage Notes (Signed)
Pt reports back pain, neck stiffness and body aches, night sweats, bruises x 2 years. Back pain and neck stiffness worse today. States she had several test done by PCP and they can't find any reason for her ys,toms. Tylenol gives no relief.

## 2022-11-28 NOTE — Plan of Care (Signed)
[facility] Tonsillectomy/Adenoidectomy, Postoperative PEDS care plan entered in error.

## 2023-01-06 ENCOUNTER — Ambulatory Visit
Admission: EM | Admit: 2023-01-06 | Discharge: 2023-01-06 | Disposition: A | Discharge status: Home / Self Care | Payer: No Typology Code available for payment source

## 2023-01-06 DIAGNOSIS — H6691 Otitis media, unspecified, right ear: Secondary | ICD-10-CM | POA: Diagnosis not present

## 2023-01-06 DIAGNOSIS — H9201 Otalgia, right ear: Secondary | ICD-10-CM

## 2023-01-06 MED ORDER — PREDNISONE 20 MG PO TABS: ORAL_TABLET | ORAL | 0 refills | Status: DC

## 2023-01-06 MED ORDER — AMOXICILLIN-POT CLAVULANATE 875-125 MG PO TABS: 1.0000 | ORAL_TABLET | Freq: Two times a day (BID) | ORAL | 0 refills | Status: AC

## 2023-01-06 NOTE — Discharge Instructions (Addendum)
Advised patient to take medications as directed with food to completion.  Patient to take prednisone with first dose of Augmentin for the next 5 of 10 days.  Encouraged to increase daily water intake to 64 ounces per day while taking this medication.  Advised if symptoms worsen and/or unresolved please follow-up with PCP, ENT or here for further evaluation.

## 2023-01-06 NOTE — ED Triage Notes (Signed)
Pt presents to [facility] with co of ha for 3 days with new onset of otalgia radiating down her cheek since this morning. Pt reports she has taken motrin for the pain.

## 2023-01-06 NOTE — ED Provider Notes (Signed)
[facility]    CSN: [id number] Arrival date & time: 01/06/23  1718      History   Chief Complaint Chief Complaint  Patient presents with   Headache   Otalgia    HPI Michele Cohen is a 42 y.o. female.   HPI 42 year old female presents with headache for 3 days and new onset ear pain radiating down cheek since this morning.  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   BRBPR (bright red blood per rectum) 03/13/2017   Dysuria 03/13/2017   Headache 03/13/2017   Pulmonary embolus ([facility]) 03/13/2017   S/P hysterectomy 03/13/2017   Septic thrombophlebitis 03/13/2017   Shortness of breath 03/13/2017   Vaginitis 03/13/2017   Breast lump 07/20/2015   Dense breasts 07/20/2015   Family history of breast cancer 07/20/2015    Past Surgical History:  Procedure Laterality Date   ABDOMINAL HYSTERECTOMY     BREAST BIOPSY Bilateral 06/2021   BREAST CYST EXCISION     COLONOSCOPY  04/14/2020   2020 and 2005   fallopian tube      removal    UPPER GASTROINTESTINAL ENDOSCOPY  04/14/2020   2006    OB History   No obstetric history on file.      Home Medications    Prior to Admission medications   Medication Sig Start Date End Date Taking? Authorizing Provider  amoxicillin-clavulanate (AUGMENTIN) 875-125 MG tablet Take 1 tablet by mouth 2 (two) times daily for 10 days. 01/06/23 01/16/23 Yes Trevor Iha, FNP  gabapentin (NEURONTIN) 100 MG capsule Take 100 mg by mouth. 12/29/22 12/29/23 Yes [provider]  predniSONE (DELTASONE) 20 MG tablet Take 3 tabs PO daily x 5 days. 01/06/23  Yes Trevor Iha, FNP  cyclobenzaprine (FLEXERIL) 5 MG tablet Take 1 tablet (5 mg total) by mouth at bedtime as needed for muscle spasms. 11/24/22   Wallis Bamberg, PA-C  NON FORMULARY Pt taking metimurcil  3 times a week    [provider]  ondansetron (ZOFRAN-ODT) 8 MG disintegrating tablet Take 1 tablet (8 mg total) by mouth every 8 (eight) hours as needed for nausea or vomiting. Patient not taking: Reported on 03/16/2022 11/18/21   Trevor Iha, FNP  pregabalin (LYRICA) 50 MG capsule Take 1 capsule (50 mg total) by mouth 2 (two) times daily. Patient not taking: Reported on 03/16/2022 10/06/21   Monica Becton, MD    Family History Family History  Problem Relation Age of Onset   Breast cancer Mother    Heart attack Mother    Diabetes Mother    Hypertension Mother    Heart failure Father    Diabetes Father    Hypertension Father    Breast cancer Maternal Aunt    Pancreatic cancer Maternal Grandfather    Colon cancer Paternal Grandfather 40       41s ?   Colon polyps Paternal Grandfather    Breast cancer Cousin    Esophageal cancer Neg  Hx    Rectal cancer Neg Hx    Stomach cancer Neg Hx     Social History Social History   Tobacco Use   Smoking status: Never   Smokeless tobacco: Never  Vaping Use   Vaping status: Never Used  Substance Use Topics   Alcohol use: No    Comment: occasional   Drug use: Never     Allergies   Aztreonam, Phenazopyridine, Shellfish allergy, Zolpidem, and Zolpidem tartrate   Review of Systems Review of Systems  HENT:  Positive for ear pain.   Neurological:  Positive for headaches.  All other systems reviewed and are negative.    Physical Exam Triage Vital Signs ED Triage Vitals  Encounter Vitals Group     BP 01/06/23 1732 120/84     Systolic BP Percentile --      Diastolic BP Percentile --      Pulse Rate 01/06/23 1732 93      Resp 01/06/23 1732 16     Temp 01/06/23 1732 97.9 F (36.6 C)     Temp src --      SpO2 01/06/23 1732 98 %     Weight --      Height --      Head Circumference --      Peak Flow --      Pain Score 01/06/23 1731 9     Pain Loc --      Pain Education --      Exclude from Growth Chart --    No data found.  Updated Vital Signs BP 120/84   Pulse 93   Temp 97.9 F (36.6 C)   Resp 16   LMP 12/15/2011   SpO2 98%   Physical Exam Vitals and nursing note reviewed.  Constitutional:      Appearance: Normal appearance. She is normal weight.  HENT:     Head: Normocephalic and atraumatic.     Right Ear: External ear normal.     Left Ear: External ear normal.     Ears:     Comments: Right TM, erythematous, bulging    Mouth/Throat:     Mouth: Mucous membranes are moist.     Pharynx: Oropharynx is clear.  Eyes:     Extraocular Movements: Extraocular movements intact.     Conjunctiva/sclera: Conjunctivae normal.     Pupils: Pupils are equal, round, and reactive to light.  Cardiovascular:     Rate and Rhythm: Normal rate and regular rhythm.     Pulses: Normal pulses.     Heart sounds: Normal heart sounds.  Pulmonary:     Effort: Pulmonary effort is normal.     Breath sounds: Normal breath sounds. No wheezing, rhonchi or rales.  Musculoskeletal:        General: Normal range of motion.     Cervical back: Normal range of motion and neck supple.  Skin:    General: Skin is warm and dry.  Neurological:     General: No focal deficit present.     Mental Status: She is alert and oriented to person, place, and time. Mental status is at baseline.  Psychiatric:        Mood and Affect: Mood normal.        Behavior: Behavior normal.      UC Treatments / Results  Labs (all labs ordered are listed, but only abnormal results are displayed) Labs Reviewed - No data to display  EKG   Radiology No results found.  Procedures Procedures (including critical care time)  Medications  Ordered in UC Medications - No data to display  Initial Impression / Assessment and Plan / UC Course  I have reviewed the triage vital signs and the nursing notes.  Pertinent labs & imaging results that were available during my care of the patient were reviewed by me and considered in my medical decision making (see chart for details).     MDM: 1.  Acute right otitis media-Rx'd Augmentin 875/125 mg tablet: Take 1 tablet twice daily x 10 days; 2.  Otalgia of right ear Rx'd prednisone 20 mg tablet: Take 3 tablets p.o. daily x 5 days. Advised patient to take medications as directed with food to completion.  Patient to take prednisone with first dose of Augmentin for the next 5 of 10 days.  Encouraged to increase daily water intake to 64 ounces per day while taking this medication.  Advised if symptoms worsen and/or unresolved please follow-up with PCP, ENT or here for further evaluation.   Final Clinical Impressions(s) / UC Diagnoses   Final diagnoses:  Acute right otitis media  Otalgia of right ear     Discharge Instructions      Advised patient to take medications as directed with food to completion.  Patient to take prednisone with first dose of Augmentin for the next 5 of 10 days.  Encouraged to increase daily water intake to 64 ounces per day while taking this medication.  Advised if symptoms worsen and/or unresolved please follow-up with PCP, ENT or here for further evaluation.     ED Prescriptions     Medication Sig Dispense Auth. Provider   amoxicillin-clavulanate (AUGMENTIN) 875-125 MG tablet Take 1 tablet by mouth 2 (two) times daily for 10 days. 20 tablet Trevor Iha, FNP   predniSONE (DELTASONE) 20 MG tablet Take 3 tabs PO daily x 5 days. 15 tablet Trevor Iha, FNP      PDMP not reviewed this encounter.   Trevor Iha, FNP 01/06/23 [phone]

## 2023-01-10 ENCOUNTER — Telehealth: Payer: Self-pay

## 2023-01-10 ENCOUNTER — Ambulatory Visit
Admission: EM | Admit: 2023-01-10 | Discharge: 2023-01-10 | Disposition: A | Discharge status: Home / Self Care | Payer: No Typology Code available for payment source | Attending: Family Medicine | Admitting: Family Medicine

## 2023-01-10 ENCOUNTER — Other Ambulatory Visit: Payer: Self-pay

## 2023-01-10 DIAGNOSIS — H66011 Acute suppurative otitis media with spontaneous rupture of ear drum, right ear: Secondary | ICD-10-CM | POA: Diagnosis not present

## 2023-01-10 DIAGNOSIS — H60391 Other infective otitis externa, right ear: Secondary | ICD-10-CM

## 2023-01-10 MED ORDER — TRAMADOL HCL 50 MG PO TABS: 50.0000 mg | ORAL_TABLET | Freq: Two times a day (BID) | ORAL | 0 refills | Status: DC | PRN

## 2023-01-10 MED ORDER — GUAIFENESIN ER 600 MG PO TB12: 600.0000 mg | ORAL_TABLET | Freq: Two times a day (BID) | ORAL | 0 refills | Status: DC

## 2023-01-10 MED ORDER — CIPROFLOXACIN-DEXAMETHASONE 0.3-0.1 % OT SUSP: 4.0000 [drp] | Freq: Two times a day (BID) | OTIC | 0 refills | Status: DC

## 2023-01-10 MED ORDER — LEVOFLOXACIN 500 MG PO TABS: 500.0000 mg | ORAL_TABLET | Freq: Every day | ORAL | 0 refills | Status: DC

## 2023-01-10 NOTE — ED Provider Notes (Signed)
[facility]    CSN: [id number] Arrival date & time: 01/10/23  0955      History   Chief Complaint Chief Complaint  Patient presents with   Ear Fullness    HPI Michele Cohen is a 42 y.o. female.   Patient is here for follow-up for an ear infection.  She was seen here 4 days ago for an ear infection and was given Augmentin.  She was given prednisone.  She states that she has not slept more than 3 hours for the last couple of days.  Her ear is still very painful.  The Augmentin does not appear to be offering any improvement even though she has been on it for 72 hours.  Hearing is diminished.  She is having some drainage from the ear.  She does not normally have sinus or ear problems.  Her last ear infection was 5 years ago.    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   BRBPR (bright red blood per rectum) 03/13/2017   Dysuria 03/13/2017   Headache 03/13/2017   Pulmonary embolus (HCC) 03/13/2017   S/P hysterectomy 03/13/2017   Septic thrombophlebitis 03/13/2017   Shortness of breath 03/13/2017   Vaginitis 03/13/2017   Breast lump 07/20/2015   Dense breasts 07/20/2015   Family history of breast cancer 07/20/2015    Past Surgical History:  Procedure Laterality Date   ABDOMINAL HYSTERECTOMY     BREAST BIOPSY Bilateral 06/2021   BREAST CYST EXCISION     COLONOSCOPY  04/14/2020   2020 and 2005   fallopian tube      removal    UPPER GASTROINTESTINAL ENDOSCOPY  04/14/2020   2006    OB History   No obstetric history on file.      Home Medications     Prior to Admission medications   Medication Sig Start Date End Date Taking? Authorizing Provider  ciprofloxacin-dexamethasone (CIPRODEX) OTIC suspension Place 4 drops into the right ear 2 (two) times daily. 01/10/23  Yes Eustace Moore, MD  guaiFENesin (MUCINEX) 600 MG 12 hr tablet Take 1 tablet (600 mg total) by mouth 2 (two) times daily. 01/10/23  Yes Eustace Moore, MD  levofloxacin (LEVAQUIN) 500 MG tablet Take 1 tablet (500 mg total) by mouth daily. 01/10/23  Yes Eustace Moore, MD  amoxicillin-clavulanate (AUGMENTIN) 875-125 MG tablet Take 1 tablet by mouth 2 (two) times daily for 10 days. 01/06/23 01/16/23  Trevor Iha, FNP  cyclobenzaprine (FLEXERIL) 5 MG tablet Take 1 tablet (5 mg total) by mouth at bedtime as needed for muscle spasms. 11/24/22   Wallis Bamberg, PA-C  gabapentin (NEURONTIN) 100 MG capsule Take 100 mg by mouth. 12/29/22 12/29/23  [provider]  NON FORMULARY Pt taking metimurcil  3 times a week    [provider]  ondansetron (ZOFRAN-ODT) 8 MG disintegrating tablet Take 1 tablet (8 mg total) by mouth every 8 (eight) hours as needed for nausea or vomiting. Patient not taking: Reported on 03/16/2022 11/18/21   Ragan,  Casimiro Needle, FNP  predniSONE (DELTASONE) 20 MG tablet Take 3 tabs PO daily x 5 days. 01/06/23   Trevor Iha, FNP  pregabalin (LYRICA) 50 MG capsule Take 1 capsule (50 mg total) by mouth 2 (two) times daily. Patient not taking: Reported on 03/16/2022 10/06/21   Monica Becton, MD  traMADol (ULTRAM) 50 MG tablet Take 1-2 tablets (50-100 mg total) by mouth every 12 (twelve) hours as needed. Take with food 01/10/23   Eustace Moore, MD    Family History Family History  Problem Relation Age of Onset   Breast cancer Mother    Heart attack Mother    Diabetes Mother    Hypertension Mother    Heart failure Father    Diabetes Father    Hypertension Father    Breast cancer Maternal Aunt    Pancreatic cancer Maternal Grandfather     Colon cancer Paternal Grandfather 60       61s ?   Colon polyps Paternal Grandfather    Breast cancer Cousin    Esophageal cancer Neg Hx    Rectal cancer Neg Hx    Stomach cancer Neg Hx     Social History Social History   Tobacco Use   Smoking status: Never   Smokeless tobacco: Never  Vaping Use   Vaping status: Never Used  Substance Use Topics   Alcohol use: No    Comment: occasional   Drug use: Never     Allergies   Aztreonam, Phenazopyridine, Shellfish allergy, Zolpidem, and Zolpidem tartrate   Review of Systems Review of Systems  See HPI Physical Exam Triage Vital Signs ED Triage Vitals  Encounter Vitals Group     BP 01/10/23 0959 (!) 140/96     Systolic BP Percentile --      Diastolic BP Percentile --      Pulse Rate 01/10/23 0959 99     Resp 01/10/23 0959 16     Temp 01/10/23 0959 98.3 F (36.8 C)     Temp src --      SpO2 01/10/23 0959 99 %     Weight --      Height --      Head Circumference --      Peak Flow --      Pain Score 01/10/23 1003 0     Pain Loc --      Pain Education --      Exclude from Growth Chart --    No data found.  Updated Vital Signs BP (!) 140/96   Pulse 99   Temp 98.3 F (36.8 C)   Resp 16   LMP 12/15/2011   SpO2 99%      Physical Exam Constitutional:      General: She is not in acute distress.    Appearance: She is well-developed. She is ill-appearing.     Comments: Appears uncomfortable  HENT:     Head: Normocephalic and atraumatic.     Left Ear: Tympanic membrane and external ear normal.     Ears:     Comments: There is purulence in the ear canal.  I was able to clear it partially with a Q-tip but patient has pain with any traction of pinna and manipulation of the ear.  I did see a partial eardrum, it is dull with some erythema.  I suspect she has had a TM rupture and has otitis externa    Nose: Congestion present. No rhinorrhea.     Mouth/Throat:  Pharynx: No posterior oropharyngeal erythema.   Eyes:     Conjunctiva/sclera: Conjunctivae normal.     Pupils: Pupils are equal, round, and reactive to light.  Cardiovascular:     Rate and Rhythm: Normal rate.  Pulmonary:     Effort: Pulmonary effort is normal. No respiratory distress.  Abdominal:     General: There is no distension.     Palpations: Abdomen is soft.  Musculoskeletal:        General: Normal range of motion.     Cervical back: Normal range of motion.  Lymphadenopathy:     Cervical: No cervical adenopathy.  Skin:    General: Skin is warm and dry.  Neurological:     Mental Status: She is alert.      UC Treatments / Results  Labs (all labs ordered are listed, but only abnormal results are displayed) Labs Reviewed - No data to display  EKG   Radiology No results found.  Procedures Procedures (including critical care time)  Medications Ordered in UC Medications - No data to display  Initial Impression / Assessment and Plan / UC Course  I have reviewed the triage vital signs and the nursing notes.  Pertinent labs & imaging results that were available during my care of the patient were reviewed by me and considered in my medical decision making (see chart for details).     Patient has severe pain and spite of 72 hours of Augmentin.  Blood not seen much Augmentin failure, but will switch her to a fluoroquinolone and see if this helps. Final Clinical Impressions(s) / UC Diagnoses   Final diagnoses:  Non-recurrent acute suppurative otitis media of right ear with spontaneous rupture of tympanic membrane     Discharge Instructions      Increase your liquids Take Mucinex twice a day (guaifenesin) Use nasal steroid, mometasone, daily All these things will help promote drainage  Stop Augmentin and try Levaquin once a day Add eardrops twice a day Take the tramadol as needed for pain See your doctor if not improving by the end of the week    ED Prescriptions     Medication Sig Dispense Auth.  Provider   guaiFENesin (MUCINEX) 600 MG 12 hr tablet Take 1 tablet (600 mg total) by mouth 2 (two) times daily. 20 tablet Eustace Moore, MD   levofloxacin (LEVAQUIN) 500 MG tablet Take 1 tablet (500 mg total) by mouth daily. 7 tablet Eustace Moore, MD   ciprofloxacin-dexamethasone [facility]) OTIC suspension Place 4 drops into the right ear 2 (two) times daily. 7.5 mL Eustace Moore, MD   traMADol (ULTRAM) 50 MG tablet Take 1-2 tablets (50-100 mg total) by mouth every 12 (twelve) hours as needed. Take with food 10 tablet Eustace Moore, MD      I have reviewed the PDMP during this encounter.   Eustace Moore, MD 01/10/23 1024

## 2023-01-10 NOTE — Discharge Instructions (Signed)
Increase your liquids Take Mucinex twice a day (guaifenesin) Use nasal steroid, mometasone, daily All these things will help promote drainage  Stop Augmentin and try Levaquin once a day Add eardrops twice a day Take the tramadol as needed for pain See your doctor if not improving by the end of the week

## 2023-01-10 NOTE — Telephone Encounter (Addendum)
Patient notified prescription for pain medication sent to pharmacy. Also passed along what Dr. Lindaann Slough recommendations are regarding follow up over next day or two if she fails to improve.

## 2023-01-10 NOTE — Telephone Encounter (Signed)
Phone message received.  Patient complains of unremitting pain from her ear infection.  While I agree that ear infections can be severely painful, I am concerned she continues to have pain 4 days after seeing.  Antibiotics showed be showing effect within 48 to 72 hours.  Please advise patient that I will prescribe her a limited number of tramadol.  If she is not improving over the next day or 2 she must seek follow-up

## 2023-01-10 NOTE — ED Triage Notes (Signed)
Patient here for right ear and head pain. Has been dx with ear infection, is already on abx. Called earlier today for pain and was sent in a rx for pain medication. Reports she cannot sleep at night. Has been taking prescribed medications. States she was told to follow up if not better in a day or two but "I don't have transportation so I came in today".

## 2023-01-19 ENCOUNTER — Ambulatory Visit: Payer: Self-pay | Admitting: Family Medicine

## 2023-01-19 ENCOUNTER — Other Ambulatory Visit: Payer: Self-pay

## 2023-01-19 ENCOUNTER — Telehealth: Payer: Self-pay

## 2023-01-19 ENCOUNTER — Emergency Department (HOSPITAL_BASED_OUTPATIENT_CLINIC_OR_DEPARTMENT_OTHER): Payer: No Typology Code available for payment source

## 2023-01-19 ENCOUNTER — Ambulatory Visit (INDEPENDENT_AMBULATORY_CARE_PROVIDER_SITE_OTHER): Payer: No Typology Code available for payment source | Admitting: Family Medicine

## 2023-01-19 ENCOUNTER — Emergency Department (HOSPITAL_BASED_OUTPATIENT_CLINIC_OR_DEPARTMENT_OTHER)
Admission: EM | Admit: 2023-01-19 | Discharge: 2023-01-19 | Disposition: A | Discharge status: Home / Self Care | Payer: No Typology Code available for payment source | Attending: Emergency Medicine | Admitting: Emergency Medicine

## 2023-01-19 VITALS — BP 136/85 | HR 96 | Ht 62.5 in | Wt 156.0 lb

## 2023-01-19 DIAGNOSIS — H60501 Unspecified acute noninfective otitis externa, right ear: Secondary | ICD-10-CM | POA: Insufficient documentation

## 2023-01-19 DIAGNOSIS — Z20822 Contact with and (suspected) exposure to covid-19: Secondary | ICD-10-CM | POA: Diagnosis not present

## 2023-01-19 DIAGNOSIS — H65194 Other acute nonsuppurative otitis media, recurrent, right ear: Secondary | ICD-10-CM | POA: Diagnosis not present

## 2023-01-19 DIAGNOSIS — H9201 Otalgia, right ear: Secondary | ICD-10-CM | POA: Diagnosis present

## 2023-01-19 DIAGNOSIS — H6690 Otitis media, unspecified, unspecified ear: Secondary | ICD-10-CM | POA: Insufficient documentation

## 2023-01-19 LAB — CBC WITH DIFFERENTIAL/PLATELET
Abs Immature Granulocytes: 0.07 10*3/uL (ref 0.00–0.07)
Basophils Absolute: 0 10*3/uL (ref 0.0–0.1)
Basophils Relative: 0 %
Eosinophils Absolute: 0 10*3/uL (ref 0.0–0.5)
Eosinophils Relative: 1 %
HCT: 43.2 % (ref 36.0–46.0)
Hemoglobin: 14.5 g/dL (ref 12.0–15.0)
Immature Granulocytes: 1 %
Lymphocytes Relative: 24 %
Lymphs Abs: 1.8 10*3/uL (ref 0.7–4.0)
MCH: 29.3 pg (ref 26.0–34.0)
MCHC: 33.6 g/dL (ref 30.0–36.0)
MCV: 87.3 fL (ref 80.0–100.0)
Monocytes Absolute: 0.4 10*3/uL (ref 0.1–1.0)
Monocytes Relative: 5 %
Neutro Abs: 5.1 10*3/uL (ref 1.7–7.7)
Neutrophils Relative %: 69 %
Platelets: 230 10*3/uL (ref 150–400)
RBC: 4.95 MIL/uL (ref 3.87–5.11)
RDW: 12.6 % (ref 11.5–15.5)
WBC: 7.4 10*3/uL (ref 4.0–10.5)
nRBC: 0 % (ref 0.0–0.2)

## 2023-01-19 LAB — BASIC METABOLIC PANEL
Anion gap: 8 (ref 5–15)
BUN: 11 mg/dL (ref 6–20)
CO2: 22 mmol/L (ref 22–32)
Calcium: 9.1 mg/dL (ref 8.9–10.3)
Chloride: 105 mmol/L (ref 98–111)
Creatinine, Ser: 0.83 mg/dL (ref 0.44–1.00)
GFR, Estimated: >60 mL/min (ref >60)
Glucose, Bld: 94 mg/dL (ref 70–99)
Potassium: 3.9 mmol/L (ref 3.5–5.1)
Sodium: 135 mmol/L (ref 135–145)

## 2023-01-19 MED ORDER — CIPROFLOXACIN-DEXAMETHASONE 0.3-0.1 % OT SUSP: 4.0000 [drp] | Freq: Two times a day (BID) | OTIC | 0 refills | Status: DC

## 2023-01-19 MED ORDER — CETIRIZINE HCL 10 MG PO TABS: 10.0000 mg | ORAL_TABLET | Freq: Every day | ORAL | 0 refills | Status: AC

## 2023-01-19 MED ORDER — IOHEXOL 300 MG/ML  SOLN
75.0000 mL | Freq: Once | INTRAMUSCULAR | Status: AC | PRN
  Administered 2023-01-19: 75 mL via INTRAVENOUS

## 2023-01-19 MED ORDER — DOXYCYCLINE HYCLATE 100 MG PO TABS: 100.0000 mg | ORAL_TABLET | Freq: Two times a day (BID) | ORAL | 0 refills | Status: AC

## 2023-01-19 NOTE — Progress Notes (Signed)
Acute Office Visit  Subjective:     Patient ID: Michele Cohen, female    DOB: 25-Jan-1981, 42 y.o.   MRN: [id number]  Chief Complaint  Patient presents with   Ear Pain    x2wks    HPI Patient is in today for concerns of earache. She has been seen in Urgent care on 11/29 and treated with Augmentin and given prednisone for R otitis media. Pt noted significant pain and was given a short course of tramadol for ear pain. Pt seen again in urgent care on 12/3 for ear pain and given levaquin for continued pain. She was also given ciprodex and tramadol and referred to ENT. She was given an appointment for ENT however it is not until January. Pt presents today with the same ear pain.   Review of Systems  Constitutional:  Negative for chills and fever.  Respiratory:  Negative for cough and shortness of breath.   Cardiovascular:  Negative for chest pain.  Neurological:  Negative for headaches.        Objective:    BP 136/85 (BP Location: Left Arm, Patient Position: Sitting, Cuff Size: Normal)   Pulse 96   Ht 5' 2.5" (1.588 m)   Wt 156 lb (70.8 kg)   LMP 12/15/2011   SpO2 99%   BMI 28.08 kg/m    Physical Exam Vitals and nursing note reviewed.  Constitutional:      General: She is not in acute distress.    Appearance: Normal appearance.  HENT:     Head: Normocephalic and atraumatic.     Right Ear: External ear normal.     Ears:     Comments: L external ear canal with erythema R TM has purulent discharge in ear with a white, chalk appearance    Nose: Nose normal.  Eyes:     Conjunctiva/sclera: Conjunctivae normal.  Cardiovascular:     Rate and Rhythm: Normal rate and regular rhythm.  Pulmonary:     Effort: Pulmonary effort is normal.     Breath sounds: Normal breath sounds.  Neurological:     General: No focal deficit present.     Mental Status: She is alert and oriented to person, place, and time.  Psychiatric:        Mood and Affect: Mood normal.         Behavior: Behavior normal.        Thought Content: Thought content normal.        Judgment: Judgment normal.     No results found for any visits on 01/19/23.      Assessment & Plan:   Problem List Items Addressed This Visit       Nervous and Auditory   Otitis media - Primary   Pt given augmentin and levaquin along with tramadol for R AOM. With her having levaquin after failed therapy I am unclear what else we can give her for her ear infection. We can go ahead and try doxycycline but she likely needs an appointment with ENT. I have placed a new referral. Will have pt continue ciprodex as well.       Relevant Medications   doxycycline (VIBRA-TABS) 100 MG tablet   Other Relevant Orders   Ambulatory referral to ENT    Meds ordered this encounter  Medications   doxycycline (VIBRA-TABS) 100 MG tablet    Sig: Take 1 tablet (100 mg total) by mouth 2 (two) times daily for 5 days.    Dispense:  10 tablet  Refill:  0   cetirizine (ZYRTEC) 10 MG tablet    Sig: Take 1 tablet (10 mg total) by mouth daily.    Dispense:  30 tablet    Refill:  0    No follow-ups on file.  Charlton Amor, DO

## 2023-01-19 NOTE — Telephone Encounter (Signed)
Per Dr. Delton See, patient needs to see ENT. Patient called and notified of this.

## 2023-01-19 NOTE — ED Triage Notes (Signed)
Pt states continued right ear pain and headache  Has been treated for ear infection twice, has taken 2 rounds of antibiotics  States right side of neck is now stiff  Seen at PCP again today  Started on another antibiotic, started on prednisone and ear drops   Referral for ENT not until January

## 2023-01-19 NOTE — Telephone Encounter (Signed)
Patient called and stated her pain has worsened and the pain is now in her neck. Would like to know what to do. Message sent to on duty provider.

## 2023-01-19 NOTE — ED Provider Notes (Signed)
[facility] Provider Note   CSN: [id number] Arrival date & time: 01/19/23  1922     History Chief Complaint  Patient presents with   Otalgia    HPI Michele Cohen is a 42 y.o. female presenting for right-sided ear pain over the last 4 weeks.  Has been seen by urgent care and PCP. Initially diagnosed as otitis media started on Amoxil and did not improve.  Upgraded to Augmentin by urgent care and still did not improve. Also put on Ciprodex as well at some point   Patient's recorded medical, surgical, social, medication list and allergies were reviewed in the Snapshot window as part of the initial history.   Review of Systems   Review of Systems  Constitutional:  Negative for chills and fever.  HENT:  Positive for ear pain and hearing loss. Negative for sore throat.   Eyes:  Negative for pain and visual disturbance.  Respiratory:  Negative for cough and shortness of breath.   Cardiovascular:  Negative for chest pain and palpitations.  Gastrointestinal:  Negative for abdominal pain and vomiting.  Genitourinary:  Negative for dysuria and hematuria.  Musculoskeletal:  Negative for arthralgias and back pain.  Skin:  Negative for color change and rash.  Neurological:  Positive for headaches. Negative for seizures and syncope.  All other systems reviewed and are negative.   Physical Exam Updated Vital Signs BP (!) 147/84 (BP Location: Right Arm)   Pulse (!) 108   Temp 98.7 F (37.1 C) (Oral)   Resp 20   Ht 5\' 3"  (1.6 m)   Wt 70.8 kg   LMP 12/15/2011   SpO2 99%   BMI 27.63 kg/m  Physical Exam Vitals and nursing note reviewed.  Constitutional:      General: She is not in acute distress.    Appearance: She is well-developed.  HENT:     Head: Normocephalic.     Comments: Substantial amount of mucopurulent, waxy, thick material removed removed from the right ear.  Appeared to have pieces of insect dissolved into the mixture.   Multistage removal as below. Eyes:     Conjunctiva/sclera: Conjunctivae normal.  Cardiovascular:     Rate and Rhythm: Normal rate and regular rhythm.     Heart sounds: No murmur heard. Pulmonary:     Effort: Pulmonary effort is normal. No respiratory distress.     Breath sounds: Normal breath sounds.  Abdominal:     General: There is no distension.     Palpations: Abdomen is soft.     Tenderness: There is no abdominal tenderness. There is no right CVA tenderness or left CVA tenderness.  Musculoskeletal:        General: No swelling or tenderness. Normal range of motion.     Cervical back: Neck supple.  Skin:    General: Skin is warm and dry.  Neurological:     General: No focal deficit present.     Mental Status: She is alert and oriented to person, place, and time. Mental status is at baseline.     Cranial Nerves: No cranial nerve deficit.      ED Course/ Medical Decision Making/ A&P Clinical Course as of 01/19/23 2332  Thu Jan 19, 2023  2331 BUN: 11 [CC]    Clinical Course User Index [CC] Glyn Ade, MD    Procedures .Foreign Body Removal  Date/Time: 01/19/2023 11:04 PM  Performed by: Glyn Ade, MD Authorized by: Glyn Ade, MD  Consent: Verbal  consent obtained. Written consent obtained. Risks and benefits: risks, benefits and alternatives were discussed Consent given by: patient Body area: ear Location details: right ear  Sedation: Patient sedated: no  Patient restrained: no Patient cooperative: yes Localization method: ENT speculum and magnification Removal mechanism: alligator forceps, irrigation and ear scoop Complexity: simple     Medications Ordered in ED Medications  iohexol (OMNIPAQUE) 300 MG/ML solution 75 mL (75 mLs Intravenous Contrast Given 01/19/23 2242)    Medical Decision Making:   Patient is presenting with headache and right-sided ear pain failing multiple outpatient antibiotics. On my exam she had a large  amount of material in the right ear that did not appear to be earwax.  Difficult to precisely discern what made up this amalgamation of material my suspicion is that it is a insect that has been dissolved in eardrops and mixed in with earwax over multiple weeks given the duration of symptoms.  The exterior otic canal is grossly erythematous after removal of this material tympanic membrane also erythematous  History of present illness physical exam findings also raise concern for possible mastoiditis given failure of multiple antibiotics and duration of symptoms. Will evaluate with cross-sectional imaging and blood work to evaluate for systemic illness and plan for reassessment.  Reassessment: On reassessment, patient is grossly improved.  CT did not show any acute pathology.  Given symptomatic improvement after removal of the material I do continue to believe this was the etiology of her ongoing symptoms.  Will recommend she continue with the Ciprodex and most recent antibiotic prescribed and be reassessed by PCP in 72 hours.  Continue to follow-up with ENT due to degree of irritation and failure of multiple antibiotics.  Disposition:  I have considered need for hospitalization, however, considering all of the above, I believe this patient is stable for discharge at this time.  Patient/family educated about specific return precautions for given chief complaint and symptoms.  Patient/family educated about follow-up with PCP.     Patient/family expressed understanding of return precautions and need for follow-up. Patient spoken to regarding all imaging and laboratory results and appropriate follow up for these results. All education provided in verbal form with additional information in written form. Time was allowed for answering of patient questions. Patient discharged.    Emergency Department Medication Summary:   Medications  iohexol (OMNIPAQUE) 300 MG/ML solution 75 mL (75 mLs Intravenous  Contrast Given 01/19/23 2242)     clinical Impression:  1. Acute otitis externa of right ear, unspecified type      Discharge   Final Clinical Impression(s) / ED Diagnoses Final diagnoses:  Acute otitis externa of right ear, unspecified type    Rx / DC Orders ED Discharge Orders          Ordered    ciprofloxacin-dexamethasone (CIPRODEX) OTIC suspension  2 times daily        01/19/23 2321              Glyn Ade, MD 01/19/23 2332

## 2023-01-19 NOTE — Assessment & Plan Note (Signed)
Pt given augmentin and levaquin along with tramadol for R AOM. With her having levaquin after failed therapy I am unclear what else we can give her for her ear infection. We can go ahead and try doxycycline but she likely needs an appointment with ENT. I have placed a new referral. Will have pt continue ciprodex as well.

## 2023-01-19 NOTE — ED Notes (Signed)
Pt told registration she is "feeling faint" pt reassessed in Triage, VS wnl, A&O x 4, in wheelchair with fall risk band in place.  NAD noted  Pt instructed that we will get her back to see the provider as soon as we can.  States "I will walk to a chair",  I instructed pt to stay seated in the wheelchair to prevent falls. Verbalized understanding. Wheeled back to lobby to wait for room availability.

## 2023-01-19 NOTE — ED Notes (Signed)
ED Provider at bedside. 

## 2023-01-19 NOTE — Telephone Encounter (Signed)
Copied from CRM [id number]. Topic: Clinical - Medical Advice >> Jan 19, 2023 10:39 AM Tiffany H wrote: Reason for CRM: Patient called to advise that she has scheduled an end-of-January appointment, which was first available, with an ENT. ENT scheduling routed patient back to Primary Care due to wait time. Patient would like some immediate help for prolonged ear infection. Previously treated with Prednisone and antiobiotic. Please assist.    Chief Complaint: Earache Symptoms: Right side ear ache, stiff neck, headache, and sore throat Frequency: constant pain x 2 weeks Pertinent Negatives: Patient denies injury or foreign body in ear Disposition: [] ED /[] Urgent Care (no appt availability in office) / [x] Appointment(In office/virtual)/ []  [place] [facility]/ [] Home Care/ [] Refused Recommended Disposition /[] [facility]/ []  Follow-up with PCP Additional Notes: Patient reports R ear pain x 2 weeks, seen and treated at ED and Urgent care, still no relief. Now having stiff neck, sore throat, and headache. Pt sts that she does not have a PCP at this time. Appt schedule at [facility] 12/12 at 1pm     Reason for Disposition  [1] SEVERE pain AND [2] not improved 2 hours after taking analgesic medication (e.g., ibuprofen or acetaminophen)  Answer Assessment - Initial Assessment Questions 1. LOCATION: "Which ear is involved?"     Right ear  2. ONSET: "When did the ear start hurting"      Approx 2-3 weeks  3. SEVERITY: "How bad is the pain?"  (Scale 1-10; mild, moderate or severe)   - MILD (1-3): doesn't interfere with normal activities    - MODERATE (4-7): interferes with normal activities or awakens from sleep    - SEVERE (8-10): excruciating pain, unable to do any normal activities      Level 10  4. URI SYMPTOMS: "Do you have a runny nose or cough?"     No URI Symptoms  5. FEVER: "Do you have a fever?" If Yes, ask: "What is your temperature, how was it measured, and when did it  start?"     Unsure if she was running a fever, felt warm a few days ago  6. CAUSE: "Have you been swimming recently?", "How often do you use Q-TIPS?", "Have you had any recent air travel or scuba diving?"     Uses qtips, but has not used in over a month  7. OTHER SYMPTOMS: "Do you have any other symptoms?" (e.g., headache, stiff neck, dizziness, vomiting, runny nose, decreased hearing)     Sore throat, stiff neck, and headache and decreased hearing  8. PREGNANCY: "Is there any chance you are pregnant?" "When was your last menstrual period?"     No  Protocols used: Davina Poke

## 2023-01-19 NOTE — ED Notes (Signed)

## 2023-01-19 NOTE — ED Notes (Signed)
Patient back from CT.

## 2023-01-19 NOTE — Patient Instructions (Signed)
Take doxycycline   Use ciprodex ear drops on the Left ear   Referral sent to ENT

## 2023-01-20 ENCOUNTER — Telehealth: Payer: Self-pay

## 2023-01-20 NOTE — Telephone Encounter (Signed)
She wants to know if she needs to take both antibiotics.

## 2023-01-20 NOTE — Telephone Encounter (Signed)
Copied from CRM [id number]. Topic: Clinical - Medical Advice >> Jan 19, 2023  2:51 PM Mosetta Putt H wrote: Reason for CRM: Patient is wanting to know does she need to continue taking antibiotic she is already taken or stop and take the newly prescribed medication

## 2023-01-20 NOTE — Telephone Encounter (Signed)
Patient informed. 

## 2023-02-27 ENCOUNTER — Ambulatory Visit: Payer: No Typology Code available for payment source | Admitting: Medical-Surgical

## 2023-03-24 ENCOUNTER — Other Ambulatory Visit: Payer: No Typology Code available for payment source

## 2023-05-02 ENCOUNTER — Other Ambulatory Visit: Payer: Self-pay | Admitting: Physical Medicine and Rehabilitation

## 2023-05-02 ENCOUNTER — Ambulatory Visit

## 2023-05-02 DIAGNOSIS — M25551 Pain in right hip: Secondary | ICD-10-CM

## 2023-05-02 DIAGNOSIS — M25552 Pain in left hip: Secondary | ICD-10-CM | POA: Diagnosis not present

## 2023-08-10 ENCOUNTER — Ambulatory Visit
Admission: EM | Admit: 2023-08-10 | Discharge: 2023-08-10 | Disposition: A | Discharge status: Home / Self Care | Attending: Family Medicine | Admitting: Family Medicine

## 2023-08-10 ENCOUNTER — Ambulatory Visit

## 2023-08-10 ENCOUNTER — Other Ambulatory Visit: Payer: Self-pay | Admitting: General Surgery

## 2023-08-10 DIAGNOSIS — Z9189 Other specified personal risk factors, not elsewhere classified: Secondary | ICD-10-CM

## 2023-08-10 DIAGNOSIS — R109 Unspecified abdominal pain: Secondary | ICD-10-CM

## 2023-08-10 DIAGNOSIS — R1030 Lower abdominal pain, unspecified: Secondary | ICD-10-CM | POA: Diagnosis not present

## 2023-08-10 DIAGNOSIS — Z20822 Contact with and (suspected) exposure to covid-19: Secondary | ICD-10-CM

## 2023-08-10 LAB — POC SARS CORONAVIRUS 2 AG -  ED: SARS Coronavirus 2 Ag: NEGATIVE

## 2023-08-10 LAB — POCT INFLUENZA A/B
Influenza A, POC: NEGATIVE
Influenza B, POC: NEGATIVE

## 2023-08-10 NOTE — ED Triage Notes (Signed)
 Pt presents to [facility] with co of lower abd pain for 2 weeks. Cramping in natures. Pt denies any constipation but endorses nausea.   Pmh significant for hysterectomy but still has her ovaries. LMP was in 2019. Pt reports the pain comes and goes and reports the pain is sometimes so bad it causes her to rock back and forth.   Pt also endorses recent contact with a friend who was flu and covid pos. Reports feeling well other than a runny nose.

## 2023-08-10 NOTE — ED Provider Notes (Signed)
 [facility]  ED Provider Note 43 year old reported healthy female presented to the ER for evaluation of abdominal pain.  Patient reported developing right lower quadrant abdominal pain with associated nausea 2 days ago.  States that she was seen in urgent care prior to arrival and had a KUB that was abnormal and was told to come to the ER for a CT scan with contrast.  Patient reports surgical history of hysterectomy.  Denies associated fevers, headaches, vision changes, chest pain, palpitations, difficulty breathing, dysuria, hematuria, diarrhea.  A medical screening exam was initiate by Raejonna Pascarella, PA-C. Appropriate diagnostic tests were ordered if clinically indicated. Patient is under nursing supervision until moved to a treatment area of the emergency department  for completion of the emergency department evaluation  Aliesha Dolata 43 y.o. female DOB: 09-16-1980 MRN: [id number] History   Chief Complaint  Patient presents with  . Abdominal Pain    Pt reports generalized abdominal pain x2 days. Nausea.   Patient (ED for evaluation abdominal pain.  Patient states that she had right lower quadrant pain with nausea for the past 2 days.  She states that her symptoms improved.  She states that today she noted some dysuria.  She denies any past medical history for kidney stones.  She states that she has recently had a hysterectomy.  She states that she was seen by urgent care told her to come to the ED for further evaluation as they were concerned for a intra-abdominal infection.  She denies any diarrhea or constipation.  She states that she is not having any nausea right now.        Past Medical History:  Diagnosis Date  . Breast mass    left side  . HPV in female   . Inverted nipple    left, occasional  . Pulmonary embolus (*)     Past Surgical History:  Procedure Laterality Date  . Breast biopsy Left 07/23/1991   tumor removed   . Breast lumpectomy Left 1994   Benign pathology  . Salpingectomy Left 2011    Social History   Substance and Sexual Activity  Alcohol Use Yes   Comment: One glass of wine every 3-4 months   Tobacco Use History[1] E-Cigarettes  . Vaping Use Never User   . Start Date    . Cartridges/Day    . Quit Date     Social History   Substance and Sexual Activity  Drug Use No         Allergies[2]  Home Medications   ACETAMINOPHEN  (TYLENOL ) 500 MG TABLET    Take 1,000 mg by mouth every 6 (six) hours as needed for Pain.   DICYCLOMINE (BENTYL) 20 MG TABLET    20 mg per 1 tablet, ORAL, TIDAC (3 times a day before meals), prn abdominal pain and cramps, 30 tablet, 1 Refill(s), Pharmacy: [id number]   DIPHENHYDRAMINE (BANOPHEN,BENADRYL) 25 MG CAPSULE    Take 25 mg by mouth every 6 (six) hours as needed for Itching.   HYDROXYZINE HCL (ATARAX) 25 MG TABLET    25 mg per 1 tablet, ORAL, qHS (each night at bedtime), as needed for sleep, 30 tablet, 0 Refill(s), Pharmacy: [id number]   LORATADINE (CLARITIN) 10 MG TABLET    Take 10 mg by mouth daily.   RIVAROXABAN (XARELTO STARTER PACK) STARTER PACK    Take 1 tablet by mouth see administration instructions. Take 15 mg twice daily with food (42 tablets total) for the first 21  days, then 20 mg once daily with food (9 tablets total) for Days 22-30. Start taking 20 mg a day only after the 15 mg twice a day tablets are gone. Do not take both together   SERTRALINE  (ZOLOFT ) 50 MG TABLET    Take 50 mg by mouth daily.    Primary Survey   Exposure    No visible abdominal trauma.       Review of Systems   Review of Systems  Physical Exam   ED Triage Vitals [08/10/23 1738]  BP (!) 147/105  Heart Rate 100  Resp 18  SpO2 97 %  Temp     Physical Exam  Nursing note and vitals reviewed. Constitutional: She appears well-developed. She does not appear distressed and does not appear ill.  HENT:  Head: Normocephalic.  Eyes: EOM  are intact.  Neck: Normal range of motion. No muscular tenderness and no spinous process tenderness. Normal range of motion.  Pulmonary/Chest: Respiratory effort normal.  Abdominal: Soft. There is abdominal tenderness (Mild tenderness to palpation of right flank, right CVA, epigastric, right upper quadrant.). There is no guarding and no rebound. Abdomen not distended. There is no palpable pulsatile mass. There is CVA tenderness (Mild right). No visible abdominal trauma. There is no rigidity. There is a negative Murphy's sign. There is no tenderness at McBurney's point.  Musculoskeletal:     Right ankle: Normal pulse.     Left ankle: Normal pulse.     Cervical back: Normal range of motion. No spinous process tenderness or muscular tenderness. Normal range of motion.     Right lower leg: No swelling or tenderness. No edema.     Left lower leg: No swelling or tenderness. No edema.     Right foot: Normal pulse.     Left foot: Normal pulse.   Neurological: She is alert. She has normal speech.  Skin: Skin is warm.  Psychiatric: She has a normal mood and affect. Her behavior is normal.     ED Course   Lab results:   CBC AND DIFFERENTIAL - Abnormal      Result Value   WBC 4.2     RBC 4.75     HGB 13.8     HCT 41.2     MCV 86.7     MCH 29.1     MCHC 33.5     Plt Ct 265     RDW SD 39.1     MPV 9.3 (*)    NRBC% 0.0     Absolute NRBC Count 0.00     NEUTROPHIL % 57.0     LYMPHOCYTE % 35.2     MONOCYTE % 5.7     Eosinophil % 1.4     BASOPHIL % 0.5     IG% 0.2     ABSOLUTE NEUTROPHIL COUNT 2.40     ABSOLUTE LYMPHOCYTE COUNT 1.48     Absolute Monocyte Count 0.24     Absolute Eosinophil Count 0.06     Absolute Basophil Count 0.02     Absolute Immature Granulocyte Count 0.01    COMPREHENSIVE METABOLIC PANEL - Abnormal   Na 135 (*)    Potassium 3.9     Cl 101     CO2 23     AGAP 11     Glucose 84     BUN 11     Creatinine 0.76     Ca 9.6     ALK PHOS 67     T  Bili 0.3      Total Protein 7.7     Alb 4.6     GLOBULIN 3.1     ALBUMIN/GLOBULIN RATIO 1.5     BUN/CREAT RATIO 14.5     ALT 27     AST 17     eGFR 99     Comment: Normal GFR (glomerular filtration rate) > 60 mL/min/1.73 meters squared, < 60 may include impaired kidney function. Calculation based on the Chronic Kidney Disease Epidemiology Collaboration (CK-EPI)equation refit without adjustment for race.  URINALYSIS W/MICRO REFLEX CULTURE - SYMPTOMATIC - Abnormal   Urine Color Yellow     Urine Clarity Clear     Urine Specific Gravity 1.030     Urine pH 5.5     Urine Protein - Dipstick Negative     Urine Glucose Negative     Urine Ketones 20 (*)    Urine Bilirubin Negative     Urine Blood 0.03 (*)    Urine Nitrite Negative     Urine Urobilinogen <2     Urine Leukocyte Esterase Negative     Urine Squamous Epithelial Cells 3-4 (*)    Urine WBC 0-2     Urine RBC 3-4 (*)    Urine Bacteria 1+ (*)    Urine Mucous 1+ (*)    UA Microscopic Yes Micro (*)    Narrative:    Does not meet criteria for reflex to Urine Culture.  LIPASE - Abnormal   Lipase 370 (*)   HUMAN CHORIONIC GONADOTROPIN  (HCG), BETA-SUBUNIT, QUALITATIVE - Normal   Beta HCG Qual Negative      Imaging:   CT ABDOMEN PELVIS W IV CONTRAST   Narrative:    INDICATION: Right Lower Quadrant Pain. COMPARISON:  CT abdomen pelvis March 13, 2017.   TECHNIQUE:  CT ABDOMEN PELVIS W IV CONTRAST - IOPAMIDOL 76 % IV SOLN 75 mL . Radiation dose reduction was utilized (automated exposure control, mA or kV adjustment based on patient size, or iterative image reconstruction).   FINDINGS:   LOWER CHEST: Visualized lung bases and heart are unremarkable.  Abdomen/Pelvis:   LIVER: Unremarkable  BILIARY: Unremarkable  PANCREAS: Unremarkable  SPLEEN: Unremarkable  ADRENALS: Unremarkable  KIDNEYS: Increased size in right upper pole cyst measuring 0.8 cm, previously 0.3 cm, likely age-related.  STOMACH: Unremarkable  DUODENUM:  Unremarkable  VASCULATURE: Previously seen right ovarian vein thrombus not well visualized on current study due to lack of contrast filling the vein.  LYMPHATIC: Unremarkable  SMALL & LARGE BOWEL: Colonic diverticula without evidence of acute diverticulitis.SABRA Unremarkable appendix.  BLADDER: Decompressed  REPRODUCTIVE ORGANS: Absent versus atrophic uterus.  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  OTHER: Unremarkable     Impression:    IMPRESSION:  No acute findings.     Electronically Signed by: Rodgers Closs on 08/10/2023 8:00 PM      ECG: ECG Results   None                                                                     Pre-Sedation Procedures  ED Course as of 08/10/23 2015  Dorn Chin's Documentation  Thu Aug 10, 2023  1831 CBC and Differential(!) No evidence of leukocytosis.  1831 Comprehensive Metabolic  Panel(!) Mild hyponatremia.  1831 Urinalysis w/ Reflex Microscopic; Reflex to Culture - Symptomatic(!) Hematuria with no evidence of UTI.  1831 hCG, Qualitative Negative.  1840 Lipase(!) Elevated at 370.  However patient denies any epigastric pain.  Patient may have underlying pancreatitis  2003 CT Abdomen Pelvis W IV Contrast Per radiology no acute findings explain the patient's symptoms.   Medical Decision Making Based on the patient's HPI and physical exam, differentials include but are not limited to: UTI, pyelonephritis, ureterolithiasis, nephrolithiasis, pancreatitis.  Lower suspicion for ovarian torsion, appendicitis, bowel obstruction, bowel perforation at this time.  Labs and imaging ordered for further evaluation of the patient's symptoms.  CBC showed no leukocytosis.  CMP showed no acute abnormality explain patient's symptoms.  Urinalysis showed hematuria with no evidence of UTI.  Lipase elevated at 370.  Patient may have pancreatitis.  Patient is tolerating p.o. and  states that her pain is under control at this moment.  CT abdomen pelvis IV contrast was ordered for further evaluation.  CT abdomen pelvis IV contrast showed no acute abnormalities explain the patient's symptoms.  Patient states that she has additionally been on Wegovy over the past month.  Pancreatitis may be due secondary to [facility].  Patient was reassessed and her pain was under control.  Patient states that she has had some mild nausea but no vomiting today.  Patient states that she has been tolerating p.o.  Patient sent home with Zofran  as well as Norco.  Patient educated on symptomatic care including eating a liquid diet for the next week.  Patient educated to follow-up with her PCP.  Patient educated return to ED if new or worsening symptoms occur.  She was in agreement with the plan.  Orders Placed This Encounter     CT Abdomen Pelvis W IV Contrast     CBC and Differential     Comprehensive Metabolic Panel     Urinalysis w/ Reflex Microscopic; Reflex to Culture - Symptomatic     hCG, Qualitative     Lipase       Amount and/or Complexity of Data Reviewed Labs:  Decision-making details documented in ED Course. Radiology:  Decision-making details documented in ED Course.  Risk Prescription drug management.      In reviewing the patient's old records, I reviewed their prior controlled substances prescriptions, using the PDMP.    Provider Communication  New Prescriptions   HYDROCODONE -ACETAMINOPHEN  (NORCO) 5-325 MG PER TABLET    Take one tablet to two tablets by mouth every 6 (six) hours as needed for Pain for up to 3 days. Max Daily Amount: 8 tablets      Quantity: 20 tablet    Refills: 0   ONDANSETRON  (ZOFRAN -ODT) 4 MG DISINTEGRATING TABLET    Take one tablet (4 mg dose) by mouth every 8 (eight) hours as needed for Nausea for up to 7 days.      Quantity: 12 tablet    Refills: 0    Modified Medications   No medications on file    Discontinued Medications   No  medications on file    Clinical Impression Final diagnoses:  Acute pancreatitis without infection or necrosis, unspecified pancreatitis type (*)    ED Disposition     ED Disposition  Discharge   Condition  Stable   Comment  --                 Follow-up Information     Cecilie Only, MD. Call in 1 week.  Specialty: Family Medicine Comments: As needed, If symptoms worsen Contact information: [address] [place] [address] [phone]                  Electronically signed by:   Dorn Cheney, PA-C 08/10/23 2015      [1] Social History Tobacco Use  Smoking Status Never  Smokeless Tobacco Never  [2] Allergies Allergen Reactions  . Azactam [Aztreonam] Anaphylaxis  . Shellfish Allergy Anaphylaxis  . Ambien Hives  . Phenazopyridine Hcl Hives

## 2023-08-10 NOTE — ED Provider Notes (Signed)
 [facility]    CSN: [id number] Arrival date & time: 08/10/23  1534      History   Chief Complaint Chief Complaint  Patient presents with   Abdominal Cramping    HPI Michele Cohen is a 43 y.o. female.   HPI does not 43 year old female presents with lower abdominal pain for 2 weeks described as cramping in nature.  Patient denies constipation but endorses nausea.  Patient reports hysterectomy and LMP 2019; however, still has her ovaries.  Patient reports at times pain can be very intense.  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   BRBPR (bright red blood per rectum) 03/13/2017   Dysuria 03/13/2017   Headache 03/13/2017   Pulmonary embolus (HCC) 03/13/2017   S/P hysterectomy 03/13/2017   Septic thrombophlebitis 03/13/2017   Shortness of breath 03/13/2017   Vaginitis 03/13/2017   Breast lump 07/20/2015   Dense breasts 07/20/2015   Family history of breast cancer 07/20/2015    Past Surgical History:  Procedure Laterality Date   ABDOMINAL HYSTERECTOMY     BREAST BIOPSY Bilateral 06/2021   BREAST CYST EXCISION     COLONOSCOPY  04/14/2020   2020 and 2005   fallopian tube      removal    UPPER GASTROINTESTINAL ENDOSCOPY  04/14/2020   2006    OB History   No obstetric history on file.      Home Medications    Prior to Admission medications   Medication Sig Start Date End Date Taking? Authorizing Provider  cetirizine  (ZYRTEC ) 10 MG tablet Take 1 tablet (10 mg total)  by mouth daily. 01/19/23   Bevin Bernice RAMAN, DO  ciprofloxacin -dexamethasone  (CIPRODEX ) OTIC suspension Place 4 drops into the right ear 2 (two) times daily. 01/19/23   Jerral Meth, MD  cyclobenzaprine  (FLEXERIL ) 5 MG tablet Take 1 tablet (5 mg total) by mouth at bedtime as needed for muscle spasms. 11/24/22   Christopher Savannah, PA-C  gabapentin (NEURONTIN) 100 MG capsule Take 100 mg by mouth. 12/29/22 12/29/23  [provider]  NON FORMULARY Pt taking metimurcil  3 times a week    [provider]  traMADol  (ULTRAM ) 50 MG tablet Take 1-2 tablets (50-100 mg total) by mouth every 12 (twelve) hours as needed. Take with food 01/10/23   Maranda Jamee Jacob, MD    Family History Family History  Problem Relation Age of Onset   Breast cancer Mother    Heart attack Mother    Diabetes Mother    Hypertension Mother    Heart failure Father    Diabetes Father    Hypertension Father    Breast cancer Maternal Aunt    Pancreatic cancer Maternal Grandfather    Colon cancer Paternal Grandfather 43       76s ?   Colon polyps Paternal Grandfather    Breast cancer Cousin    Esophageal cancer Neg Hx    Rectal cancer Neg Hx  Stomach cancer Neg Hx     Social History Social History   Tobacco Use   Smoking status: Never   Smokeless tobacco: Never  Vaping Use   Vaping status: Never Used  Substance Use Topics   Alcohol use: No    Comment: occasional   Drug use: Never     Allergies   Aztreonam, Phenazopyridine, Shellfish allergy, Zolpidem, and Zolpidem tartrate   Review of Systems Review of Systems  Gastrointestinal:  Positive for abdominal pain.  All other systems reviewed and are negative.    Physical Exam Triage Vital Signs ED Triage Vitals  Encounter Vitals Group     BP      Girls Systolic BP Percentile      Girls Diastolic BP Percentile      Boys Systolic BP Percentile      Boys Diastolic BP Percentile      Pulse      Resp      Temp      Temp src      SpO2       Weight      Height      Head Circumference      Peak Flow      Pain Score      Pain Loc      Pain Education      Exclude from Growth Chart    No data found.  Updated Vital Signs BP (!) 149/100   Pulse 99   Temp 98.2 F (36.8 C)   Resp 16   LMP 12/15/2011   SpO2 98%      Physical Exam Vitals and nursing note reviewed.  Constitutional:      Appearance: Normal appearance.  HENT:     Head: Normocephalic and atraumatic.     Mouth/Throat:     Mouth: Mucous membranes are moist.     Pharynx: Oropharynx is clear.  Eyes:     Extraocular Movements: Extraocular movements intact.     Conjunctiva/sclera: Conjunctivae normal.     Pupils: Pupils are equal, round, and reactive to light.  Cardiovascular:     Rate and Rhythm: Normal rate and regular rhythm.     Pulses: Normal pulses.     Heart sounds: Normal heart sounds.  Pulmonary:     Effort: Pulmonary effort is normal.     Breath sounds: Normal breath sounds. No wheezing, rhonchi or rales.  Abdominal:     General: Abdomen is flat. Bowel sounds are normal. There is no abdominal bruit.     Palpations: Abdomen is soft. There is no shifting dullness, fluid wave, hepatomegaly, splenomegaly, mass or pulsatile mass.     Tenderness: There is abdominal tenderness in the right lower quadrant and left lower quadrant. There is no guarding. Negative signs include Murphy's sign, Rovsing's sign and McBurney's sign.     Hernia: No hernia is present.  Musculoskeletal:        General: Normal range of motion.     Cervical back: Normal range of motion and neck supple.  Skin:    General: Skin is warm and dry.  Neurological:     General: No focal deficit present.     Mental Status: She is alert and oriented to person, place, and time. Mental status is at baseline.  Psychiatric:        Mood and Affect: Mood normal.        Behavior: Behavior normal.      UC Treatments / Results  Labs (all labs  ordered are listed, but only abnormal  results are displayed) Labs Reviewed  CBC WITH DIFFERENTIAL/PLATELET  COMPREHENSIVE METABOLIC PANEL WITH GFR  POC SARS CORONAVIRUS 2 AG -  ED  POCT INFLUENZA A/B    EKG   Radiology DG Abd 1 View Result Date: 08/10/2023 CLINICAL DATA:  Abdominal pain for 2 weeks. EXAM: ABDOMEN - 1 VIEW COMPARISON:  CT 11/18/2021 FINDINGS: Normal bowel gas pattern. No bowel dilatation or evidence of obstruction. Small volume of formed stool in the colon. The right renal stone on prior is not seen on the current exam. No visible radiopaque calculi. Lung bases are clear. No osseous abnormalities are seen. IMPRESSION: Normal bowel gas pattern. Small volume of formed stool in the colon. Electronically Signed   By: Andrea Gasman M.D.   On: 08/10/2023 17:01    Procedures Procedures (including critical care time)  Medications Ordered in UC Medications - No data to display  Initial Impression / Assessment and Plan / UC Course  I have reviewed the triage vital signs and the nursing notes.  Pertinent labs & imaging results that were available during my care of the patient were reviewed by me and considered in my medical decision making (see chart for details).     MDM: 1.  Exposure to COVID-19 virus-COVID-19, Influenza A/B- today; 3.  Lower abdominal pain-KUB x-ray results revealed above, patient advised, CBC with differential, CMP ordered. Advised patient of KUB x-ray results with hardcopy and images provided.  Advised patient we will follow-up with lab results once received tomorrow morning Friday, 7//25.  Advised if symptoms worsen and/or unresolved please go to [facility] ED for further evaluation to include CT of abdomen and pelvis with contrast.  Patient discharged home, hemodynamically stable. Final Clinical Impressions(s) / UC Diagnoses   Final diagnoses:  Exposure to COVID-19 virus  Lower abdominal pain     Discharge Instructions      Advised patient of KUB x-ray results with  hardcopy and images provided.  Advised patient we will follow-up with lab results once received tomorrow morning Friday, 7//25.  Advised if symptoms worsen and/or unresolved please go to [facility] ED for further evaluation to include CT of abdomen and pelvis with contrast.     ED Prescriptions   None    PDMP not reviewed this encounter.   Teddy Sharper, FNP 08/10/23 1726

## 2023-08-10 NOTE — Discharge Instructions (Addendum)
 Advised patient of KUB x-ray results with hardcopy and images provided.  Advised patient we will follow-up with lab results once received tomorrow morning Friday, 7//25.  Advised if symptoms worsen and/or unresolved please go to [facility] ED for further evaluation to include CT of abdomen and pelvis with contrast.

## 2023-08-11 ENCOUNTER — Ambulatory Visit (HOSPITAL_COMMUNITY): Payer: Self-pay

## 2023-08-11 LAB — COMPREHENSIVE METABOLIC PANEL WITH GFR
ALT: 26 IU/L (ref 0–32)
AST: 16 IU/L (ref 0–40)
Albumin: 4.8 g/dL (ref 3.9–4.9)
Alkaline Phosphatase: 66 IU/L (ref 44–121)
BUN/Creatinine Ratio: 14 (ref 9–23)
BUN: 10 mg/dL (ref 6–24)
Bilirubin Total: 0.3 mg/dL (ref 0.0–1.2)
CO2: 19 mmol/L — ABNORMAL LOW (ref 20–29)
Calcium: 9.4 mg/dL (ref 8.7–10.2)
Chloride: 101 mmol/L (ref 96–106)
Creatinine, Ser: 0.72 mg/dL (ref 0.57–1.00)
Globulin, Total: 2.7 g/dL (ref 1.5–4.5)
Glucose: 75 mg/dL (ref 70–99)
Potassium: 4.4 mmol/L (ref 3.5–5.2)
Sodium: 136 mmol/L (ref 134–144)
Total Protein: 7.5 g/dL (ref 6.0–8.5)
eGFR: 106 mL/min/1.73 (ref >59)

## 2023-08-11 LAB — CBC WITH DIFFERENTIAL/PLATELET
Basophils Absolute: 0 x10E3/uL (ref 0.0–0.2)
Basos: 1 %
EOS (ABSOLUTE): 0.1 x10E3/uL (ref 0.0–0.4)
Eos: 2 %
Hematocrit: 44 % (ref 34.0–46.6)
Hemoglobin: 14.3 g/dL (ref 11.1–15.9)
Immature Grans (Abs): 0 x10E3/uL (ref 0.0–0.1)
Immature Granulocytes: 0 %
Lymphocytes Absolute: 1.3 x10E3/uL (ref 0.7–3.1)
Lymphs: 32 %
MCH: 28.8 pg (ref 26.6–33.0)
MCHC: 32.5 g/dL (ref 31.5–35.7)
MCV: 89 fL (ref 79–97)
Monocytes Absolute: 0.2 x10E3/uL (ref 0.1–0.9)
Monocytes: 6 %
Neutrophils Absolute: 2.5 x10E3/uL (ref 1.4–7.0)
Neutrophils: 59 %
Platelets: 302 x10E3/uL (ref 150–450)
RBC: 4.97 x10E6/uL (ref 3.77–5.28)
RDW: 12.6 % (ref 11.7–15.4)
WBC: 4.1 x10E3/uL (ref 3.4–10.8)

## 2023-08-12 NOTE — ED Provider Notes (Signed)
 [facility] Emergency Department Emergency Department Provider Note  Provider at Bedside:  08/12/2023 10:16 AM  Chief Complaint: Shortness of breath and weakness  History of Present Illness:  History obtained from: Patient  Michele Cohen is a 43 y.o. female with PMHx of PE, ovarian vein thrombosis, who presents to the ED with complaints of progressive worsening shortness of breath and generalized weakness over the past 5 days.  Patient was recently seen at [facility] health ED for abdominal pain, and had workup with CT abdomen pelvis performed and only showed that her lipase was 370 that day, but she did not have any signs of pancreatitis on her CT scan and was able to tolerate p.o. fluids so she went home.  Patient states abdominal pain has definitely improved, but now she is short of breath and weak.  Patient states she was exposed to COVID-19 a few days ago as well, but her COVID test was -2 days ago in the urgent care.  Since that time she has had progressive worsening shortness of breath and weakness and just some dyspnea on exertion.  Patient has history of PE in the past but is not on blood thinners at this time.  She denies any fever or chills or cough.  She denies any chest pain with the shortness of breath.  She denies any nausea vomiting or change in her bowels but still having minimal epigastric abdominal pain.  She denies any dysuria frequency or hematuria.   ______________________ ROS: Pertinent positives and negatives per HPI. Pertinent past medical, surgical, social and family history records were reviewed. Current Medications and Allergies were reviewed.  Physical Exam   Vitals:   08/12/23 0939  BP: 116/77  BP Location: Right arm  Patient Position: Sitting  Pulse: 103  Resp: 15  Temp: 97.9 F (36.6 C)  TempSrc: Oral  SpO2: 99%  Weight: 64 kg (141 lb)  Height: 157.5 cm (5' 2)      Physical Exam Vitals and nursing note reviewed.   Constitutional:      General: She is not in acute distress.    Appearance: Normal appearance.  HENT:     Head: Normocephalic and atraumatic.     Right Ear: External ear normal.     Left Ear: External ear normal.     Nose: Nose normal.     Mouth/Throat:     Mouth: Mucous membranes are moist.   Eyes:     Pupils: Pupils are equal, round, and reactive to light.    Cardiovascular:     Rate and Rhythm: Regular rhythm. Tachycardia present.     Heart sounds: Normal heart sounds.  Pulmonary:     Effort: Pulmonary effort is normal.     Breath sounds: Normal breath sounds.  Abdominal:     Palpations: Abdomen is soft.     Tenderness: There is no abdominal tenderness.   Musculoskeletal:        General: Normal range of motion.     Cervical back: Normal range of motion.   Skin:    General: Skin is warm.   Neurological:     General: No focal deficit present.     Mental Status: She is alert and oriented to person, place, and time.   Psychiatric:        Mood and Affect: Mood normal.        Behavior: Behavior normal.        Thought Content: Thought content normal.     Results  EKG Impression:  My Interpretation: None performed  Labs: Lab Results (last 24 hours)     Procedure Component Value Ref Range Date/Time   Troponin, High Sensitive (2 Hr Rfx) [[id number]]  (Normal) Collected: 08/12/23 1200   Lab Status: Final result Specimen: Blood from Venous Updated: 08/12/23 1240    Troponin, High Sensitive <3 <15 ng/L     Comment: >= 15 ng/L INDICATES MYOCARDIAL DAMAGE.THE DIAGNOSIS OF MYOCARDIAL INFARCTION REQUIRES CLINICAL CORRELATION.   Elevated troponin may also be due to myocardial stress from a variety of causes.   Alkaline Phos (ALP) levels >400 U/L may cause falsely elevated results. Troponin test is invalid in patients taking asfotase alpha.   This troponin assay was not validated for evaluation of troponin in patients younger than 21 years. There are no ranges  established for patients younger than 21 years. Therefore, laboratory results for these patients should be  interpreted with caution.       Urinalysis with Reflex to Microscopic [[id number]]  (Abnormal) Collected: 08/12/23 1026   Lab Status: Final result Specimen: Urine from Clean Catch Updated: 08/12/23 1112    Color, Urine Yellow Yellow     Clarity, Urine Clear Clear     Specific Gravity, Urine 1.018 1.005 - 1.025     pH, Urine 6.0 5.0 - 8.0     Protein, Urine Negative Negative, 10 , 20  mg/dL     Glucose, Urine Negative Negative, 30 , 50  mg/dL     Ketones, Urine 20* Negative, Trace mg/dL     Bilirubin, Urine Negative Negative     Blood, Urine Negative Negative, Trace     Nitrite, Urine Negative Negative     Leukocyte Esterase, Urine Negative Negative, 25     Urobilinogen, Urine Normal <2.0 mg/dL    Narrative:     Microscopic not indicated   SARS-Cov-2, Flu, and RSV, Qualitative NAAT [[id number]]  (Normal) Collected: 08/12/23 1026   Lab Status: Final result Specimen: Swab from Nasopharynx Updated: 08/12/23 1151    SARS-CoV-2 Negative Negative     Influenza A Negative Negative     Influenza B Negative Negative     RSV Negative Negative    Narrative:     Results are for use in the simultaneous rapid in vitro detection and differentiation of SARS-CoV-2, RSV, influenza A virus, and influenza B virus nucleic acids by PCR in clinical specimens.   Positive results are indicative of active infection but do not rule out bacterial infection or co-infection with other pathogens not detected by the test. Clinical correlation with patient history and other diagnostic information is necessary to determine patient infection status. The agent detected may not be the definite cause of disease.   Negative results do not preclude SARS-CoV-2, RSV, influenza A, and/or influenza B infection and should not be used as the sole basis for diagnosis, treatment or other patient management decisions. Negative  results must be combined with clinical observations, patient history, and/or epidemiological information.   Test performed by [facility] qualified personnel using the Cepheid Xpert SARS-CoV-2 & Influenza A/B Nucleic acid test on the GeneXpert instrument. This test is only for use under the Food and Drug Administration's Emergency Use Authorization (EUA).   Blood Gas Profile (Venous) [[id number]]  (Abnormal) Collected: 08/12/23 1019   Lab Status: Final result Specimen: Blood from Venous Updated: 08/12/23 1029   Narrative:     The following orders were created for panel order Blood Gas Profile (Venous). Procedure  Abnormality         Status                    ---------                               -----------         ------                    Blood Gas, Venous[[id number]]           Abnormal            Final result               Please view results for these tests on the individual orders.   Blood Gas, Venous [[id number]]  (Abnormal) Collected: 08/12/23 1019   Lab Status: Final result Specimen: Blood from Venous Updated: 08/12/23 1029    pH, Blood Gas 7.402 7.320 - 7.420     pCO2, Venous 40.9 None Defined mmHg     pO2, Venous 49.0 None Defined mmHg     HCO3, Blood Gas 25 21 - 28 mmol/L     Base Deficit (-) / Base Excess 0.7 -2.0 - 2.0 mmol/L     O2 Saturation, Venous (measured) 82.8* 70.0 - 80.0 %     Source, Blood Gas Venous   CBC with Differential [[id number]]  (Abnormal) Collected: 08/12/23 1006   Lab Status: Final result Specimen: Blood from Venous Updated: 08/12/23 1030   Narrative:     The following orders were created for panel order CBC with Differential. Procedure                               Abnormality         Status                    ---------                               -----------         ------                    CBC with Differential[[id number]]       Abnormal            Final result               Please view results for these tests on the  individual orders.   Comprehensive Metabolic Panel [[id number]]  (Abnormal) Collected: 08/12/23 1006   Lab Status: Final result Specimen: Blood from Venous Updated: 08/12/23 1059    Sodium 135* 136 - 145 mmol/L     Potassium 4.2 3.4 - 4.5 mmol/L     Chloride 104 98 - 107 mmol/L     CO2 25 21 - 31 mmol/L     Anion Gap 6 6 - 14 mmol/L     Glucose, Random 85 70 - 99 mg/dL     Blood Urea Nitrogen (BUN) 14 7 - 25 mg/dL     Creatinine 9.21 9.39 - 1.20 mg/dL     eGFR >09 >40 fO/fpw/8.26f7     Comment: GFR estimated by CKD-EPI equations(NKF 2021).   Recommend confirmation of Cr-based eGFR by using Cys-based eGFR and other filtration markers (if applicable) in complex cases and  clinical decision-making, as needed.      Albumin 4.2 3.5 - 5.7 g/dL     Total Protein 6.8 6.4 - 8.9 g/dL     Bilirubin, Total 0.4 0.3 - 1.0 mg/dL     Alkaline Phosphatase (ALP) 47 34 - 104 U/L     Aspartate Aminotransferase (AST) 13 13 - 39 U/L     Alanine Aminotransferase (ALT) 19 7 - 52 U/L     Calcium 8.7 8.6 - 10.3 mg/dL     BUN/Creatinine Ratio --    Comment: Creatinine is normal, ratio is not clinically indicated.      Troponin, High Sensitive (0 Hr + 2 Hr Rfx) [[id number]]  (Normal) Collected: 08/12/23 1006   Lab Status: Final result Specimen: Blood from Venous Updated: 08/12/23 1104    Troponin, High Sensitive <3 <15 ng/L     Comment: >= 15 ng/L INDICATES MYOCARDIAL DAMAGE.THE DIAGNOSIS OF MYOCARDIAL INFARCTION REQUIRES CLINICAL CORRELATION.   Elevated troponin may also be due to myocardial stress from a variety of causes.   Alkaline Phos (ALP) levels >400 U/L may cause falsely elevated results. Troponin test is invalid in patients taking asfotase alpha.   This troponin assay was not validated for evaluation of troponin in patients younger than 21 years. There are no ranges established for patients younger than 21 years. Therefore, laboratory results for these patients should be  interpreted with  caution.       CBC with Differential [[id number]]  (Abnormal) Collected: 08/12/23 1006   Lab Status: Final result Specimen: Blood from Venous Updated: 08/12/23 1030    WBC 3.65* 4.40 - 11.00 10*3/uL     RBC 4.65 4.10 - 5.10 10*6/uL     Hemoglobin 13.8 12.3 - 15.3 g/dL     Hematocrit 60.8 64.0 - 44.6 %     Mean Corpuscular Volume (MCV) 84.1 80.0 - 96.0 fL     Mean Corpuscular Hemoglobin (MCH) 29.6 27.5 - 33.2 pg     Mean Corpuscular Hemoglobin Conc (MCHC) 35.2 33.0 - 37.0 g/dL     Red Cell Distribution Width (RDW) 13.1 12.3 - 17.0 %     Platelet Count (PLT) 240 150 - 450 10*3/uL     Mean Platelet Volume (MPV) 7.9 6.8 - 10.2 fL     Neutrophils % 68 %     Lymphocytes % 23 %     Monocytes % 7 %     Eosinophils % 1 %     Basophils % 1 %     Neutrophils Absolute 2.50 1.80 - 7.80 10*3/uL     Lymphocytes # 0.80* 1.00 - 4.80 10*3/uL     Monocytes # 0.20 0.00 - 0.80 10*3/uL     Eosinophils # 0.00 0.00 - 0.50 10*3/uL     Basophils # 0.00 0.00 - 0.20 10*3/uL    Lipase [[id number]]  (Abnormal) Collected: 08/12/23 1006   Lab Status: Final result Specimen: Blood from Venous Updated: 08/12/23 1059    Lipase 124* 11 - 82 U/L    B-Type Natriuretic Peptide (BNP) [[id number]]  (Normal) Collected: 08/12/23 1006   Lab Status: Final result Specimen: Blood from Venous Updated: 08/12/23 1114    B-Type Natriuretic Peptide (BNP) 3 <100 pg/mL        My interpretation of patient's labs shows that the CBC and CMP were essentially normal except for sodium of 135, urinalysis is negative COVID flu and RSV were negative, VBG was normal and initial troponin to rule out ischemia was normal.  Her  lipase was 124 so was still slightly elevated but definitely down from 370 that it was a few days ago.  CXR Impression: (Interpreted by me) My Interpretation: Chest xray for pneumonia, pneumothorax, pleural effusion, pulmonary edema, cardiomegaly, aortic abnormality was performed and did not show any acute  cardiopulmonary findings.  Impression of additional imaging studies include: CTA of the chest to rule out PE was negative for any acute PE.  Imaging: Radiology Results (last 72 hours)     Procedure Component Value Units Date/Time   CT Angio Chest Pulmonary Embolism [[id number]] Collected: 08/12/23 1253   Order Status: Completed Updated: 08/12/23 1258   Narrative:     CLINICAL DATA:  TQYERU7^TQYE CT2^STARMDPulmonary embolism (PE) suspected, high prob  EXAM: CT ANGIOGRAPHY CHEST WITH CONTRAST  TECHNIQUE: Multidetector CT imaging of the chest was performed using the standard protocol during bolus administration of intravenous contrast. Multiplanar CT image reconstructions and MIPs were obtained to evaluate the vascular anatomy.  RADIATION DOSE REDUCTION: This exam was performed according to the departmental dose-optimization program which includes automated exposure control, adjustment of the mA and/or kV according to patient size and/or use of iterative reconstruction technique.  CONTRAST:  80 mL iohexoL  (OMNIPAQUE ) 350 mg iodine/mL injection (MDV) 80 mL  COMPARISON:  Chest radiograph same day  FINDINGS: Cardiovascular: No filling defects within the pulmonary arteries to suggest acute pulmonary embolism.  Mediastinum/Nodes: No axillary or supraclavicular adenopathy. No mediastinal or hilar adenopathy. No pericardial fluid. Esophagus normal.  Lungs/Pleura: No pulmonary infarction. No pneumonia. No pleural fluid. No pneumothorax  Upper Abdomen: Limited view of the liver, kidneys, pancreas are unremarkable. Normal adrenal glands.  Musculoskeletal: No aggressive osseous lesion.  Review of the MIP images confirms the above findings.    Impression:     1. No evidence acute pulmonary embolism. 2. No acute pulmonary findings.   Electronically Signed   By: Jackquline Boxer M.D.   On: 08/12/2023 12:56    XR Chest 1 View [[id number]] Collected: 08/12/23 1114   Order  Status: Completed Updated: 08/12/23 1122   Narrative:     CLINICAL DATA:  Short of breath  EXAM: CHEST  1 VIEW  COMPARISON:  None Available.  FINDINGS: The heart size and mediastinal contours are within normal limits. Both lungs are clear. The visualized skeletal structures are unremarkable.    Impression:     No active disease.   Electronically Signed   By: Jackquline Boxer M.D.   On: 08/12/2023 11:20    CT Abdomen Pelvis W IV Only [[id number]] Collected: 08/10/23 1955   Order Status: Completed Updated: 08/10/23 2025   Narrative:     INDICATION: Right Lower Quadrant Pain. COMPARISON:  CT abdomen pelvis March 13, 2017.   TECHNIQUE:  CT ABDOMEN PELVIS W IV CONTRAST - IOPAMIDOL 76 % IV SOLN 75 mL . Radiation dose reduction was utilized (automated exposure control, mA or kV adjustment based on patient size, or iterative image reconstruction).   FINDINGS:   LOWER CHEST: Visualized lung bases and heart are unremarkable.  Abdomen/Pelvis:   LIVER: Unremarkable  BILIARY: Unremarkable  PANCREAS: Unremarkable  SPLEEN: Unremarkable  ADRENALS: Unremarkable  KIDNEYS: Increased size in right upper pole cyst measuring 0.8 cm, previously 0.3 cm, likely age-related.  STOMACH: Unremarkable  DUODENUM: Unremarkable  VASCULATURE: Previously seen right ovarian vein thrombus not well visualized on current study due to lack of contrast filling the vein.  LYMPHATIC: Unremarkable  SMALL & LARGE BOWEL: Colonic diverticula without evidence of acute diverticulitis.SABRA Unremarkable appendix.  BLADDER: Decompressed  REPRODUCTIVE ORGANS: Absent versus atrophic uterus.  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  OTHER: Unremarkable   Impression:     IMPRESSION:  No acute findings.     Electronically Signed by: Rodgers Closs on 08/10/2023 8:00 PM   XR Abdomen 1 View [[id number]] Resulted: 08/10/23 1701   Order Status: Completed Updated: 08/12/23 0938    Narrative:     CLINICAL DATA:  Abdominal pain for 2 weeks.  EXAM: ABDOMEN - 1 VIEW  COMPARISON:  CT 11/18/2021  FINDINGS: Normal bowel gas pattern. No bowel dilatation or evidence of obstruction. Small volume of formed stool in the colon. The right renal stone on prior is not seen on the current exam. No visible radiopaque calculi. Lung bases are clear. No osseous abnormalities are seen.  IMPRESSION: Normal bowel gas pattern. Small volume of formed stool in the colon.   Electronically Signed   By: Andrea Gasman M.D.   On: 08/10/2023 17:01        Procedures   Procedures  Evidence Based Calculators      ED Course   ED Course as of 08/12/23 1420  Sat Aug 12, 2023  1013 Patient's initial vital signs are stable.  Patient was mildly tachycardic. [LT]  1013 Intervention-patient will get routine labs, urinalysis, lipase, and we will get CTA of the chest to rule out PE.  Patient again will get COVID flu and RSV testing as well. [LT]  1414 CTA of the chest to rule out PE was negative for any acute findings.  I had a long discussion with the patient because I feel like her pancreatitis is possibly secondary to her [facility].  Patient has been taking Wegovy for about 2 to 3 months.  She is going to have to discuss with her primary care doctor to stop the [facility] which is recommended if you get pancreatitis from it.  She is going to do a clear liquid bland diet at home and continue hydration. [LT]  1414 Disposition-discharge. [LT]    ED Course User Index [LT] Rock Dannielle Birmingham, MD    Medical Decision Making   External records were reviewed: Patient was recently seen in the ED on 08/10/2023 for the abdominal pain and elevated lipase with possible pancreatitis.  She was seen on the ED on 01/19/2023 for acute otitis externa of her right ear.  And she was seen in the ED on 11/24/2022 for sciatica. _________________________  Everardo JONETTA Michele Cohen is a 43 y.o. female who presents to  the ED with complaints of progressive worsening shortness of breath and generalized weakness over the past 5 days.  Patient was recently seen at [facility] health ED for abdominal pain, and had workup with CT abdomen pelvis performed and only showed that her lipase was 370 that day, but she did not have any signs of pancreatitis on her CT scan and was able to tolerate p.o. fluids so she went home.  Patient states abdominal pain has definitely improved, but now she is short of breath and weak.  Patient states she was exposed to COVID-19 a few days ago as well, but her COVID test was -2 days ago in the urgent care.  Since that time she has had progressive worsening shortness of breath and weakness and just some dyspnea on exertion.  Patient has history of PE in the past but is not on blood thinners at this time.  She denies any fever or chills or cough.  She denies any chest pain  with the shortness of breath.  She denies any nausea vomiting or change in her bowels but still having minimal epigastric abdominal pain.  She denies any dysuria frequency or hematuria.  . On my initial evaluation, patient is mildly tachycardic and mildly tachypneic.  The following differentials were considered: DDX: MI, angina, aortic dissection, pneumonia, PE, pneumothorax, musculoskeletal pain, cardiomyopathy, bronchitis, COPD, CHF.    Pertinent studies were obtained, with results listed in chart above.  results interpreted as above.  Based on ED workup, findings are consistent with shortness of breath, generalized weakness, drug-induced pancreatitis, medication side effect.    Patient received no particular medications in the ER.  On reevaluation, symptoms are improved.  Clinical Assessment/Plan: Patient presented to the ER with shortness of breath and just generalized weakness had been recently diagnosed with pancreatitis about 4 to 5 days prior but sent home.  Patient states that she has been able to do a clear liquid bland  diet but she still feels very weak and today she started feeling extremely short of breath as well.  Patient's had history of PE in the past so was concerned.  Patient's workup for the shortness of breath was negative and her CT angiography of the chest did not show any PE.  Patient's lipase was 124 which was still slightly elevated but down from the 370 that was performed 5 days ago.  Patient with a long discussion we felt that the [facility] was what was causing her pancreatitis and she just started that 2 to 3 months ago.  She is going to discuss that with her primary care doctor on Monday.  Patient was told to continue doing a clear liquid bland diet return to the ER of course if condition worsens.  Discussion of management or test interpretation with external provider(s): No external providers needed   Clinical Complexity/Risk   Patient's presentation is most consistent with acute presentation with potential threat to life or bodily function.  Patient's multiple comorbidities increases the complexity of managing their  presentation with shortness of breath.    Provider time spent in patient care today, inclusive of but not limited to clinical reassessment, review of diagnostic studies, and discharge preparation, was greater than 30 minutes.  OTC medications: no, none Prescription medications discussed: no, continue current medications except for [facility]  ED Clinical Impression   Diagnoses that have been ruled out:  None  Diagnoses that are still under consideration:  None  Final diagnoses:  Shortness of breath  Generalized weakness  Medication side effect  Drug-induced acute pancreatitis, unspecified complication status (CMD)    ED Assessment/Plan   ED Disposition     ED Disposition  Discharge   Condition  Stable   Comment  --         DISCHARGE MEDICATIONS   Medication List     ASK your doctor about these medications    cyclobenzaprine  10 mg tablet Commonly known  as: FLEXERIL  Take 10 mg by mouth 3 (three) times a day as needed for muscle spasms. Indications: muscle spasm   EPINEPHrine 0.3 mg/0.3 mL injection syringe Commonly known as: EPIPEN Inject 0.3 mg into the muscle once as needed for anaphylaxis for up to 1 dose. Indications: person at risk of anaphylaxis   gabapentin 100 mg capsule Commonly known as: NEURONTIN Take 1 capsule (100 mg total) by mouth 3 (three) times a day.   multivitamin Tab tablet Commonly known as: THERAGRAN Take 1 tablet by mouth Once Daily.   ondansetron  8 mg  disintegrating tablet Commonly known as: ZOFRAN -ODT Take 8 mg by mouth.   topiramate 50 mg tablet Commonly known as: Topamax Take 1 tablet (50 mg total) by mouth 2 (two) times a day.        FOLLOW UP Tammy Rachel Brought, MD [address] [place] [address] [phone]  In 2 days For Recheck   ____________________________ Scribe's Attestation: This document serves as a record of services personally performed by Rock Birmingham, MD. It was created on their behalf by Rock Dannielle Birmingham, MD, a trained medical scribe. The creation of this record is the provider's dictation and/or activities during the visit.   Electronically signed by: Rock Dannielle Birmingham, MD 08/12/2023 10:16 AM      *Some images could not be shown.

## 2023-08-19 ENCOUNTER — Encounter (HOSPITAL_BASED_OUTPATIENT_CLINIC_OR_DEPARTMENT_OTHER): Payer: Self-pay | Admitting: Emergency Medicine

## 2023-08-19 ENCOUNTER — Emergency Department (HOSPITAL_BASED_OUTPATIENT_CLINIC_OR_DEPARTMENT_OTHER)
Admission: EM | Admit: 2023-08-19 | Discharge: 2023-08-20 | Disposition: A | Discharge status: Home / Self Care | Attending: Emergency Medicine | Admitting: Emergency Medicine

## 2023-08-19 ENCOUNTER — Emergency Department (HOSPITAL_BASED_OUTPATIENT_CLINIC_OR_DEPARTMENT_OTHER)

## 2023-08-19 DIAGNOSIS — R1030 Lower abdominal pain, unspecified: Secondary | ICD-10-CM | POA: Diagnosis present

## 2023-08-19 DIAGNOSIS — N390 Urinary tract infection, site not specified: Secondary | ICD-10-CM | POA: Insufficient documentation

## 2023-08-19 DIAGNOSIS — K8689 Other specified diseases of pancreas: Secondary | ICD-10-CM | POA: Diagnosis not present

## 2023-08-19 DIAGNOSIS — K649 Unspecified hemorrhoids: Secondary | ICD-10-CM | POA: Insufficient documentation

## 2023-08-19 DIAGNOSIS — R1013 Epigastric pain: Secondary | ICD-10-CM

## 2023-08-19 HISTORY — DX: Acute pancreatitis without necrosis or infection, unspecified: K85.90

## 2023-08-19 LAB — COMPREHENSIVE METABOLIC PANEL WITH GFR
ALT: 24 U/L (ref 0–44)
AST: 23 U/L (ref 15–41)
Albumin: 4.6 g/dL (ref 3.5–5.0)
Alkaline Phosphatase: 61 U/L (ref 38–126)
Anion gap: 19 — ABNORMAL HIGH (ref 5–15)
BUN: 10 mg/dL (ref 6–20)
CO2: 20 mmol/L — ABNORMAL LOW (ref 22–32)
Calcium: 9.6 mg/dL (ref 8.9–10.3)
Chloride: 97 mmol/L — ABNORMAL LOW (ref 98–111)
Creatinine, Ser: 0.95 mg/dL (ref 0.44–1.00)
GFR, Estimated: >60 mL/min (ref >60)
Glucose, Bld: 104 mg/dL — ABNORMAL HIGH (ref 70–99)
Potassium: 3.7 mmol/L (ref 3.5–5.1)
Sodium: 135 mmol/L (ref 135–145)
Total Bilirubin: 0.5 mg/dL (ref 0.0–1.2)
Total Protein: 7.4 g/dL (ref 6.5–8.1)

## 2023-08-19 LAB — CBC WITH DIFFERENTIAL/PLATELET
Abs Immature Granulocytes: 0.03 K/uL (ref 0.00–0.07)
Basophils Absolute: 0 K/uL (ref 0.0–0.1)
Basophils Relative: 0 %
Eosinophils Absolute: 0 K/uL (ref 0.0–0.5)
Eosinophils Relative: 0 %
HCT: 38.6 % (ref 36.0–46.0)
Hemoglobin: 13.4 g/dL (ref 12.0–15.0)
Immature Granulocytes: 0 %
Lymphocytes Relative: 18 %
Lymphs Abs: 1.2 K/uL (ref 0.7–4.0)
MCH: 28.9 pg (ref 26.0–34.0)
MCHC: 34.7 g/dL (ref 30.0–36.0)
MCV: 83.4 fL (ref 80.0–100.0)
Monocytes Absolute: 0.5 K/uL (ref 0.1–1.0)
Monocytes Relative: 8 %
Neutro Abs: 4.9 K/uL (ref 1.7–7.7)
Neutrophils Relative %: 74 %
Platelets: 312 K/uL (ref 150–400)
RBC: 4.63 MIL/uL (ref 3.87–5.11)
RDW: 12.3 % (ref 11.5–15.5)
WBC: 6.7 K/uL (ref 4.0–10.5)
nRBC: 0 % (ref 0.0–0.2)

## 2023-08-19 LAB — WET PREP, GENITAL
Clue Cells Wet Prep HPF POC: NONE SEEN
Sperm: NONE SEEN
Trich, Wet Prep: NONE SEEN
WBC, Wet Prep HPF POC: >=10 — AB (ref <10)
Yeast Wet Prep HPF POC: NONE SEEN

## 2023-08-19 LAB — URINALYSIS, ROUTINE W REFLEX MICROSCOPIC
Glucose, UA: NEGATIVE mg/dL
Ketones, ur: >=80 mg/dL — AB
Leukocytes,Ua: NEGATIVE
Nitrite: NEGATIVE
Protein, ur: NEGATIVE mg/dL
Specific Gravity, Urine: 1.025 (ref 1.005–1.030)
pH: 5.5 (ref 5.0–8.0)

## 2023-08-19 LAB — LIPASE, BLOOD: Lipase: 40 U/L (ref 11–51)

## 2023-08-19 LAB — URINALYSIS, MICROSCOPIC (REFLEX)

## 2023-08-19 MED ORDER — LACTATED RINGERS IV BOLUS
1000.0000 mL | Freq: Once | INTRAVENOUS | Status: AC
  Administered 2023-08-19: 1000 mL via INTRAVENOUS

## 2023-08-19 MED ORDER — SODIUM CHLORIDE 0.9 % IV SOLN
1.0000 g | Freq: Once | INTRAVENOUS | Status: AC
  Administered 2023-08-19: 1 g via INTRAVENOUS
  Filled 2023-08-19: qty 10

## 2023-08-19 MED ORDER — IOHEXOL 300 MG/ML  SOLN
75.0000 mL | Freq: Once | INTRAMUSCULAR | Status: AC | PRN
  Administered 2023-08-19: 75 mL via INTRAVENOUS

## 2023-08-19 MED ORDER — MORPHINE SULFATE (PF) 4 MG/ML IV SOLN
4.0000 mg | Freq: Once | INTRAVENOUS | Status: AC
  Administered 2023-08-19: 4 mg via INTRAVENOUS
  Filled 2023-08-19: qty 1

## 2023-08-19 MED ORDER — ONDANSETRON HCL 4 MG/2ML IJ SOLN
4.0000 mg | Freq: Once | INTRAMUSCULAR | Status: AC
  Administered 2023-08-19: 4 mg via INTRAVENOUS
  Filled 2023-08-19: qty 2

## 2023-08-19 NOTE — ED Provider Notes (Signed)
 [place] [facility] Provider Note   CSN: [id number] Arrival date & time: 08/19/23  8251     Patient presents with: Abdominal Pain   Russell Engelstad is a 43 y.o. female.  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   As soon as eat or drink, stomach hurts, feels nauseas  Today when went to bathroom had blood, difficult to tell where it is coming from Major abdomen lower abdomen to back on both sides No dysuria  Early part of week was uncomfortable Has not had many BM, twice this week and very little, not eating much.  Mucus on BM .  Didn't see blood on the stool. Looked like day 4 of menses, was spotting without using the bathroom. Has had some rectal throbbing started today Chills, no fever, has not taken temp PCP yesterday because dizziness, has to hold onto things  HR very high. Went to PCP, recommended hydration.  Upper abdomen with pain with urination immediately. Like 6 minutes after.  Nausea, no vomiting.  No cp or dyspnea now Thought blood clots post surgical from hyesterectomy  Past Medical History:  Diagnosis Date   Anemia    Concussion    Diverticulosis    Headache disorder    History of benign breast tumor    Lumbar radiculopathy    Pancreatitis    Pulmonary embolism (HCC) 2019   Renal cyst    Stress headaches     Past Surgical History:  Procedure Laterality Date   ABDOMINAL HYSTERECTOMY     BREAST BIOPSY Bilateral 06/2021   BREAST CYST EXCISION     COLONOSCOPY  04/14/2020   2020 and 2005   fallopian tube      removal    UPPER GASTROINTESTINAL ENDOSCOPY  04/14/2020   2006    Prior to Admission  medications   Medication Sig Start Date End Date Taking? Authorizing Provider  cetirizine  (ZYRTEC ) 10 MG tablet Take 1 tablet (10 mg total) by mouth daily. 01/19/23   Bevin Bernice RAMAN, DO  ciprofloxacin -dexamethasone  (CIPRODEX ) OTIC suspension Place 4 drops into the right ear 2 (two) times daily. 01/19/23   Jerral Meth, MD  cyclobenzaprine  (FLEXERIL ) 5 MG tablet Take 1 tablet (5 mg total) by mouth at bedtime as needed for muscle spasms. 11/24/22   Christopher Savannah, PA-C  gabapentin (NEURONTIN) 100 MG capsule Take 100 mg by mouth. 12/29/22 12/29/23  [provider]  NON FORMULARY Pt taking metimurcil  3 times a week    [provider]  traMADol  (ULTRAM ) 50 MG tablet Take 1-2 tablets (50-100 mg total) by mouth every 12 (twelve) hours as needed. Take with food 01/10/23   Maranda Jamee Jacob, MD    Allergies: Aztreonam, Phenazopyridine, Shellfish allergy, Zolpidem, and Zolpidem tartrate    Review of Systems  Updated Vital Signs BP (!) 129/90 (BP Location: Right Arm)   Pulse (!) 141   Temp 98.4 F (36.9 C)   Resp 18   Ht 5' 2.5 (1.588 m)   Wt 61.2 kg   LMP 12/15/2011   SpO2 99%   BMI 24.30 kg/m   Physical Exam  (all labs ordered are listed, but only abnormal results are displayed)  Labs Reviewed  LIPASE, BLOOD  COMPREHENSIVE METABOLIC PANEL WITH GFR  URINALYSIS, ROUTINE W REFLEX MICROSCOPIC  CBC WITH DIFFERENTIAL/PLATELET    EKG: None  Radiology: No results found.  {Document cardiac monitor, telemetry assessment procedure when appropriate:32947} Procedures   Medications Ordered in the ED - No data to display    {Click here for ABCD2, HEART and other calculators REFRESH Note before signing:1}                              Medical Decision Making Amount and/or Complexity of Data Reviewed Labs: ordered.   ***  {Document critical care time when appropriate  Document review of labs and clinical decision tools ie CHADS2VASC2, etc  Document your  independent review of radiology images and any outside records  Document your discussion with family members, caretakers and with consultants  Document social determinants of health affecting pt's care  Document your decision making why or why not admission, treatments were needed:32947:::1}   Final diagnoses:  None    ED Discharge Orders     None

## 2023-08-19 NOTE — ED Triage Notes (Signed)
 Pt has recent hx of pancreatitis- c/o decreased appetite (feels dehydrated), lower abd pain; she is having bleeding, but she is not sure if it is vaginal or rectal; tachycardia noted

## 2023-08-19 NOTE — ED Notes (Addendum)
 Pt sts she has had stiff  neck bil sides for 1 week  with a headache.

## 2023-08-20 MED ORDER — ONDANSETRON 4 MG PO TBDP: 4.0000 mg | ORAL_TABLET | Freq: Three times a day (TID) | ORAL | 0 refills | Status: DC | PRN

## 2023-08-20 MED ORDER — CEPHALEXIN 500 MG PO CAPS
500.0000 mg | ORAL_CAPSULE | Freq: Three times a day (TID) | ORAL | 0 refills | Status: AC
Start: 2023-08-20 — End: 2023-08-27

## 2023-08-20 MED ORDER — PANTOPRAZOLE SODIUM 20 MG PO TBEC: 40.0000 mg | DELAYED_RELEASE_TABLET | Freq: Every day | ORAL | 0 refills | Status: DC

## 2023-08-21 LAB — GC/CHLAMYDIA PROBE AMP (~~LOC~~) NOT AT ARMC
Chlamydia: NEGATIVE
Comment: NEGATIVE
Comment: NORMAL
Neisseria Gonorrhea: NEGATIVE

## 2023-08-21 LAB — URINE CULTURE

## 2023-10-10 ENCOUNTER — Encounter: Payer: Self-pay | Admitting: Sports Medicine

## 2023-11-28 ENCOUNTER — Ambulatory Visit
Admission: EM | Admit: 2023-11-28 | Discharge: 2023-11-28 | Disposition: A | Discharge status: Home / Self Care | Attending: Family Medicine | Admitting: Family Medicine

## 2023-11-28 ENCOUNTER — Encounter: Payer: Self-pay | Admitting: Emergency Medicine

## 2023-11-28 DIAGNOSIS — J029 Acute pharyngitis, unspecified: Secondary | ICD-10-CM | POA: Diagnosis not present

## 2023-11-28 DIAGNOSIS — J069 Acute upper respiratory infection, unspecified: Secondary | ICD-10-CM | POA: Diagnosis not present

## 2023-11-28 LAB — POC SOFIA SARS ANTIGEN FIA: SARS Coronavirus 2 Ag: NEGATIVE

## 2023-11-28 LAB — POCT INFLUENZA A/B
Influenza A, POC: NEGATIVE
Influenza B, POC: NEGATIVE

## 2023-11-28 LAB — POCT RAPID STREP A (OFFICE): Rapid Strep A Screen: NEGATIVE

## 2023-11-28 MED ORDER — AZELASTINE-FLUTICASONE 137-50 MCG/ACT NA SUSP: 1.0000 | Freq: Two times a day (BID) | NASAL | 0 refills | Status: AC

## 2023-11-28 NOTE — ED Provider Notes (Signed)
 [name]    CSN: [id number] Arrival date & time: 11/28/23  1659      History   Chief Complaint Chief Complaint  Patient presents with   Sore Throat    HPI Michele Cohen is a 43 y.o. female.   HPI  Patient has sore throat sinus congestion headache and fatigue for the last couple of days.  No cough or shortness of breath.  No known exposure to illness.  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   BRBPR (bright red blood per rectum) 03/13/2017   Dysuria 03/13/2017   Headache 03/13/2017   Pulmonary embolus (HCC) 03/13/2017   S/P hysterectomy 03/13/2017   Septic thrombophlebitis 03/13/2017   Shortness of breath 03/13/2017   Vaginitis 03/13/2017   Breast lump 07/20/2015   Dense breasts 07/20/2015   Family history of breast cancer 07/20/2015    Past Surgical History:  Procedure Laterality Date   ABDOMINAL HYSTERECTOMY     BREAST BIOPSY Bilateral 06/2021   BREAST CYST EXCISION     COLONOSCOPY  04/14/2020   2020 and 2005   fallopian tube      removal    UPPER GASTROINTESTINAL ENDOSCOPY  04/14/2020   2006    OB History   No obstetric history on file.      Home Medications    Prior to Admission medications   Medication Sig Start Date End Date Taking? Authorizing Provider  atorvastatin (LIPITOR) 40 MG tablet Take 40 mg by mouth daily. 05/13/23  Yes [provider]  Azelastine-Fluticasone 137-50 MCG/ACT SUSP Place 1 puff into the nose in the morning and at bedtime. 11/28/23  Yes Maranda Jamee Jacob, MD  cetirizine  (ZYRTEC ) 10 MG tablet Take 1 tablet (10 mg total) by mouth daily. 01/19/23  Yes Bevin Bernice RAMAN, DO    Family History Family History  Problem Relation Age of Onset   Breast cancer Mother    Heart attack Mother    Diabetes Mother    Hypertension Mother    Heart failure Father    Diabetes Father    Hypertension Father    Breast cancer Maternal Aunt    Pancreatic cancer Maternal Grandfather    Colon cancer Paternal Grandfather 5       23s ?   Colon polyps Paternal Grandfather    Breast cancer Cousin    Esophageal cancer Neg Hx    Rectal cancer Neg Hx    Stomach cancer Neg Hx     Social History Social History   Tobacco Use   Smoking status: Never   Smokeless tobacco: Never  Vaping Use   Vaping status: Never Used  Substance Use Topics   Alcohol use: No    Comment: occasional   Drug use: Never     Allergies   Aztreonam, Phenazopyridine, Shellfish allergy, and Zolpidem   Review of Systems Review of Systems  See HPI Physical Exam Triage Vital Signs ED Triage Vitals  Encounter Vitals Group  BP 11/28/23 1723 130/86     Girls Systolic BP Percentile --      Girls Diastolic BP Percentile --      Boys Systolic BP Percentile --      Boys Diastolic BP Percentile --      Pulse Rate 11/28/23 1723 98     Resp 11/28/23 1723 18     Temp 11/28/23 1723 98.9 F (37.2 C)     Temp Source 11/28/23 1723 Oral     SpO2 11/28/23 1723 98 %     Weight 11/28/23 1721 136 lb (61.7 kg)     Height 11/28/23 1721 5' 2.5 (1.588 m)     Head Circumference --      Peak Flow --      Pain Score 11/28/23 1721 7     Pain Loc --      Pain Education --      Exclude from Growth Chart --    No data found.  Updated Vital Signs BP 130/86 (BP Location: Right Arm)   Pulse 98   Temp 98.9 F (37.2 C) (Oral)   Resp 18   Ht 5' 2.5 (1.588 m)   Wt 61.7 kg   LMP 12/15/2011   SpO2 98%   BMI 24.48 kg/m      Physical Exam Constitutional:      General: She is  not in acute distress.    Appearance: She is well-developed and normal weight. She is ill-appearing.  HENT:     Head: Normocephalic and atraumatic.     Right Ear: Tympanic membrane normal.     Left Ear: Tympanic membrane normal.     Nose: Congestion and rhinorrhea present.     Mouth/Throat:     Mouth: Mucous membranes are moist.     Pharynx: Posterior oropharyngeal erythema present.     Tonsils: 0 on the right. 0 on the left.  Eyes:     Conjunctiva/sclera: Conjunctivae normal.     Pupils: Pupils are equal, round, and reactive to light.  Cardiovascular:     Rate and Rhythm: Normal rate and regular rhythm.  Pulmonary:     Effort: Pulmonary effort is normal. No respiratory distress.  Musculoskeletal:        General: Normal range of motion.     Cervical back: Normal range of motion.  Lymphadenopathy:     Cervical: No cervical adenopathy.  Skin:    General: Skin is warm and dry.  Neurological:     Mental Status: She is alert.      UC Treatments / Results  Labs (all labs ordered are listed, but only abnormal results are displayed) Labs Reviewed  POCT INFLUENZA A/B - Normal  POC SOFIA SARS ANTIGEN FIA - Normal  POCT RAPID STREP A (OFFICE) - Normal    EKG   Radiology No results found.  Procedures Procedures (including critical care time)  Medications Ordered in UC Medications - No data to display  Initial Impression / Assessment and Plan / UC Course  I have reviewed the triage vital signs and the nursing notes.  Pertinent labs & imaging results that were available during my care of the patient were reviewed by me and considered in my medical decision making (see chart for details).     Viral illness discussed Final Clinical Impressions(s) / UC Diagnoses   Final diagnoses:  Acute sore throat  Viral upper respiratory tract infection     Discharge Instructions      Drink lots of fluids  Take tylenol  or ibuprofen for pain and fever Use the nasal mist 2 x a  day See your doctor if you fail to improve   ED Prescriptions     Medication Sig Dispense Auth. Provider   Azelastine-Fluticasone 137-50 MCG/ACT SUSP Place 1 puff into the nose in the morning and at bedtime. 23 g Maranda Jamee Jacob, MD      PDMP not reviewed this encounter.   Maranda Jamee Jacob, MD 11/28/23 MAURINE

## 2023-11-28 NOTE — Discharge Instructions (Signed)
 Drink lots of fluids Take tylenol  or ibuprofen for pain and fever Use the nasal mist 2 x a day See your doctor if you fail to improve

## 2023-11-28 NOTE — ED Triage Notes (Signed)
 Patient c/o sore throat, HA, fatigue, head feels heavy x 2 days.  Patient has taken Nyquil.

## 2023-11-29 ENCOUNTER — Telehealth: Payer: Self-pay

## 2023-11-29 NOTE — Telephone Encounter (Signed)
 Follow up call. Pt reporting friend positive for covid asking advice if she should be retested. Advised watchfull waiting and retesting at home if concerned.

## 2023-12-01 ENCOUNTER — Ambulatory Visit
Admission: RE | Admit: 2023-12-01 | Discharge: 2023-12-01 | Disposition: A | Discharge status: Home / Self Care | Source: Ambulatory Visit | Attending: General Surgery | Admitting: General Surgery

## 2023-12-01 ENCOUNTER — Other Ambulatory Visit: Payer: Self-pay | Admitting: General Surgery

## 2023-12-01 DIAGNOSIS — Z9189 Other specified personal risk factors, not elsewhere classified: Secondary | ICD-10-CM

## 2023-12-01 MED ORDER — IOPAMIDOL (ISOVUE-370) INJECTION 76%
100.0000 mL | Freq: Once | INTRAVENOUS | Status: AC | PRN
  Administered 2023-12-01: 100 mL via INTRAVENOUS

## 2024-01-28 ENCOUNTER — Other Ambulatory Visit: Payer: Self-pay

## 2024-01-28 ENCOUNTER — Encounter (HOSPITAL_BASED_OUTPATIENT_CLINIC_OR_DEPARTMENT_OTHER): Payer: Self-pay

## 2024-01-28 ENCOUNTER — Encounter: Payer: Self-pay | Admitting: Emergency Medicine

## 2024-01-28 ENCOUNTER — Emergency Department (HOSPITAL_BASED_OUTPATIENT_CLINIC_OR_DEPARTMENT_OTHER)
Admission: EM | Admit: 2024-01-28 | Discharge: 2024-01-28 | Disposition: A | Discharge status: Home / Self Care | Attending: Emergency Medicine | Admitting: Emergency Medicine

## 2024-01-28 ENCOUNTER — Ambulatory Visit
Admission: EM | Admit: 2024-01-28 | Discharge: 2024-01-28 | Disposition: A | Discharge status: Home / Self Care | Attending: Family Medicine | Admitting: Family Medicine

## 2024-01-28 ENCOUNTER — Emergency Department (HOSPITAL_BASED_OUTPATIENT_CLINIC_OR_DEPARTMENT_OTHER)

## 2024-01-28 DIAGNOSIS — D72829 Elevated white blood cell count, unspecified: Secondary | ICD-10-CM | POA: Diagnosis not present

## 2024-01-28 DIAGNOSIS — R824 Acetonuria: Secondary | ICD-10-CM | POA: Diagnosis not present

## 2024-01-28 DIAGNOSIS — R748 Abnormal levels of other serum enzymes: Secondary | ICD-10-CM | POA: Diagnosis not present

## 2024-01-28 DIAGNOSIS — K5732 Diverticulitis of large intestine without perforation or abscess without bleeding: Secondary | ICD-10-CM | POA: Diagnosis not present

## 2024-01-28 DIAGNOSIS — R10A2 Flank pain, left side: Secondary | ICD-10-CM

## 2024-01-28 DIAGNOSIS — R319 Hematuria, unspecified: Secondary | ICD-10-CM

## 2024-01-28 DIAGNOSIS — R509 Fever, unspecified: Secondary | ICD-10-CM

## 2024-01-28 DIAGNOSIS — R000 Tachycardia, unspecified: Secondary | ICD-10-CM | POA: Diagnosis not present

## 2024-01-28 DIAGNOSIS — K5792 Diverticulitis of intestine, part unspecified, without perforation or abscess without bleeding: Secondary | ICD-10-CM

## 2024-01-28 DIAGNOSIS — R197 Diarrhea, unspecified: Secondary | ICD-10-CM | POA: Diagnosis present

## 2024-01-28 LAB — COMPREHENSIVE METABOLIC PANEL WITH GFR
ALT: 17 U/L (ref 0–44)
AST: 13 U/L — ABNORMAL LOW (ref 15–41)
Albumin: 4.1 g/dL (ref 3.5–5.0)
Alkaline Phosphatase: 65 U/L (ref 38–126)
Anion gap: 12 (ref 5–15)
BUN: 7 mg/dL (ref 6–20)
CO2: 22 mmol/L (ref 22–32)
Calcium: 9.1 mg/dL (ref 8.9–10.3)
Chloride: 102 mmol/L (ref 98–111)
Creatinine, Ser: 0.74 mg/dL (ref 0.44–1.00)
GFR, Estimated: >60 mL/min
Glucose, Bld: 102 mg/dL — ABNORMAL HIGH (ref 70–99)
Potassium: 4.1 mmol/L (ref 3.5–5.1)
Sodium: 136 mmol/L (ref 135–145)
Total Bilirubin: 0.6 mg/dL (ref 0.0–1.2)
Total Protein: 7.1 g/dL (ref 6.5–8.1)

## 2024-01-28 LAB — URINALYSIS, ROUTINE W REFLEX MICROSCOPIC
Bilirubin Urine: NEGATIVE
Glucose, UA: NEGATIVE mg/dL
Ketones, ur: 15 mg/dL — AB
Leukocytes,Ua: NEGATIVE
Nitrite: NEGATIVE
Protein, ur: NEGATIVE mg/dL
Specific Gravity, Urine: 1.025 (ref 1.005–1.030)
pH: 6 (ref 5.0–8.0)

## 2024-01-28 LAB — CBC
HCT: 39.2 % (ref 36.0–46.0)
Hemoglobin: 13.3 g/dL (ref 12.0–15.0)
MCH: 29 pg (ref 26.0–34.0)
MCHC: 33.9 g/dL (ref 30.0–36.0)
MCV: 85.4 fL (ref 80.0–100.0)
Platelets: 205 K/uL (ref 150–400)
RBC: 4.59 MIL/uL (ref 3.87–5.11)
RDW: 12.8 % (ref 11.5–15.5)
WBC: 11.4 K/uL — ABNORMAL HIGH (ref 4.0–10.5)
nRBC: 0 % (ref 0.0–0.2)

## 2024-01-28 LAB — POCT URINE DIPSTICK
Bilirubin, UA: NEGATIVE
Glucose, UA: NEGATIVE mg/dL
Leukocytes, UA: NEGATIVE
Nitrite, UA: NEGATIVE
POC PROTEIN,UA: 30 — AB
Spec Grav, UA: >=1.03 — AB
Urobilinogen, UA: 0.2 U/dL
pH, UA: 6

## 2024-01-28 LAB — LACTIC ACID, PLASMA: Lactic Acid, Venous: 0.9 mmol/L (ref 0.5–1.9)

## 2024-01-28 LAB — URINALYSIS, MICROSCOPIC (REFLEX)

## 2024-01-28 LAB — PREGNANCY, URINE: Preg Test, Ur: NEGATIVE

## 2024-01-28 LAB — LIPASE, BLOOD: Lipase: 92 U/L — ABNORMAL HIGH (ref 11–51)

## 2024-01-28 MED ORDER — FENTANYL CITRATE (PF) 50 MCG/ML IJ SOSY
50.0000 ug | PREFILLED_SYRINGE | Freq: Once | INTRAMUSCULAR | Status: AC
  Administered 2024-01-28: 50 ug via INTRAVENOUS
  Filled 2024-01-28: qty 1

## 2024-01-28 MED ORDER — OXYCODONE HCL 5 MG PO TABS: 5.0000 mg | ORAL_TABLET | Freq: Four times a day (QID) | ORAL | 0 refills | Status: AC | PRN

## 2024-01-28 MED ORDER — KETOROLAC TROMETHAMINE 15 MG/ML IJ SOLN
15.0000 mg | Freq: Once | INTRAMUSCULAR | Status: AC
  Administered 2024-01-28: 15 mg via INTRAVENOUS
  Filled 2024-01-28: qty 1

## 2024-01-28 MED ORDER — ONDANSETRON 4 MG PO TBDP: 4.0000 mg | ORAL_TABLET | Freq: Three times a day (TID) | ORAL | 0 refills | Status: AC | PRN

## 2024-01-28 MED ORDER — LACTATED RINGERS IV BOLUS
1000.0000 mL | Freq: Once | INTRAVENOUS | Status: AC
  Administered 2024-01-28: 1000 mL via INTRAVENOUS

## 2024-01-28 MED ORDER — LEVOFLOXACIN IN D5W 750 MG/150ML IV SOLN
750.0000 mg | Freq: Once | INTRAVENOUS | Status: AC
  Administered 2024-01-28: 750 mg via INTRAVENOUS
  Filled 2024-01-28: qty 150

## 2024-01-28 MED ORDER — SODIUM CHLORIDE 0.9 % IV BOLUS
500.0000 mL | Freq: Once | INTRAVENOUS | Status: AC
  Administered 2024-01-28: 500 mL via INTRAVENOUS

## 2024-01-28 MED ORDER — METRONIDAZOLE 500 MG/100ML IV SOLN
500.0000 mg | Freq: Once | INTRAVENOUS | Status: AC
  Administered 2024-01-28: 500 mg via INTRAVENOUS
  Filled 2024-01-28: qty 100

## 2024-01-28 MED ORDER — ONDANSETRON HCL 4 MG/2ML IJ SOLN
4.0000 mg | Freq: Once | INTRAMUSCULAR | Status: AC
  Administered 2024-01-28: 4 mg via INTRAVENOUS
  Filled 2024-01-28: qty 2

## 2024-01-28 MED ORDER — OXYCODONE HCL 5 MG PO TABS
5.0000 mg | ORAL_TABLET | Freq: Once | ORAL | Status: AC
  Administered 2024-01-28: 5 mg via ORAL
  Filled 2024-01-28: qty 1

## 2024-01-28 NOTE — ED Provider Notes (Signed)
 " [place] [facility] Provider Note   CSN: [id number] Arrival date & time: 01/28/24  0908     Patient presents with: Abdominal Pain and Flank Pain   Michele Cohen is a 43 y.o. female with history of pulmonary embolism not currently on anticoagulation, pancreatitis, presents with concern for cramping abdominal pain that started 3 days ago after eating sushi and having one glass of wine.  She reports after eating the sushi and wine, she had some nausea and a couple episodes of nonbloody diarrhea.  She has not had any further alcohol since 3 days ago.  Yesterday, she developed left-sided flank pain.  This pain is constant and sharp in nature.  She reports subjective fever and chills at home.  Denies any dysuria, hematuria, increased frequency.  She was seen by urgent care earlier today and recommended to come to the ER for further evaluation.    Abdominal Pain Flank Pain Associated symptoms include abdominal pain.       Prior to Admission medications  Medication Sig Start Date End Date Taking? Authorizing Provider  ondansetron  (ZOFRAN -ODT) 4 MG disintegrating tablet Take 1 tablet (4 mg total) by mouth every 8 (eight) hours as needed for nausea or vomiting. 01/28/24  Yes Veta Palma, PA-C  oxyCODONE  (ROXICODONE ) 5 MG immediate release tablet Take 1 tablet (5 mg total) by mouth every 6 (six) hours as needed for severe pain (pain score 7-10) or breakthrough pain (Pain not controlled with tylenol  and ibuprofen). 01/28/24  Yes Veta Palma, PA-C  atorvastatin (LIPITOR) 40 MG tablet Take 40 mg by mouth daily. 05/13/23   [provider]  Azelastine -Fluticasone  137-50 MCG/ACT SUSP Place 1 puff into the nose in the morning and at bedtime. 11/28/23   Maranda Jamee Jacob, MD  cetirizine  (ZYRTEC ) 10 MG tablet Take 1 tablet (10 mg total) by mouth daily. 01/19/23   Bevin Bernice RAMAN, DO  FLUoxetine (PROZAC) 10 MG tablet Take 10 mg by mouth daily.  11/09/23   [provider]    Allergies: Aztreonam, Phenazopyridine, Shellfish allergy, and Zolpidem    Review of Systems  Gastrointestinal:  Positive for abdominal pain.  Genitourinary:  Positive for flank pain.    Updated Vital Signs BP 94/64 (BP Location: Left Arm)   Pulse 92   Temp 98.9 F (37.2 C) (Oral)   Resp 16   Ht 5' 2.5 (1.588 m)   Wt 67.6 kg   LMP 12/15/2011   SpO2 100%   BMI 26.82 kg/m   Physical Exam Vitals and nursing note reviewed.  Constitutional:      General: She is not in acute distress.    Appearance: She is well-developed.  HENT:     Head: Normocephalic and atraumatic.  Eyes:     Conjunctiva/sclera: Conjunctivae normal.  Cardiovascular:     Rate and Rhythm: Regular rhythm. Tachycardia present.     Heart sounds: No murmur heard. Pulmonary:     Effort: Pulmonary effort is normal. No respiratory distress.     Breath sounds: Normal breath sounds.  Abdominal:     Palpations: Abdomen is soft.     Tenderness: There is abdominal tenderness. There is left CVA tenderness. There is no right CVA tenderness.     Comments: Mild epigastric tenderness without rebound or guarding  Musculoskeletal:        General: No swelling.     Cervical back: Neck supple.  Skin:    General: Skin is warm and dry.  Capillary Refill: Capillary refill takes less than 2 seconds.  Neurological:     Mental Status: She is alert.  Psychiatric:        Mood and Affect: Mood normal.     (all labs ordered are listed, but only abnormal results are displayed) Labs Reviewed  LIPASE, BLOOD - Abnormal; Notable for the following components:      Result Value   Lipase 92 (*)    All other components within normal limits  COMPREHENSIVE METABOLIC PANEL WITH GFR - Abnormal; Notable for the following components:   Glucose, Bld 102 (*)    AST 13 (*)    All other components within normal limits  CBC - Abnormal; Notable for the following components:   WBC 11.4 (*)    All  other components within normal limits  URINALYSIS, ROUTINE W REFLEX MICROSCOPIC - Abnormal; Notable for the following components:   Hgb urine dipstick TRACE (*)    Ketones, ur 15 (*)    All other components within normal limits  URINALYSIS, MICROSCOPIC (REFLEX) - Abnormal; Notable for the following components:   Bacteria, UA RARE (*)    All other components within normal limits  URINE CULTURE  PREGNANCY, URINE  LACTIC ACID, PLASMA    EKG: None  Radiology: CT Renal Stone Study Result Date: 01/28/2024 CLINICAL DATA:  Abdominal/flank pain. EXAM: CT ABDOMEN AND PELVIS WITHOUT CONTRAST TECHNIQUE: Multidetector CT imaging of the abdomen and pelvis was performed following the standard protocol without IV contrast. RADIATION DOSE REDUCTION: This exam was performed according to the departmental dose-optimization program which includes automated exposure control, adjustment of the mA and/or kV according to patient size and/or use of iterative reconstruction technique. COMPARISON:  CT abdomen pelvis dated 08/19/2023. FINDINGS: Evaluation of this exam is limited in the absence of intravenous contrast as well as due to respiratory motion. Lower chest: The visualized lung bases are clear. No intra-abdominal free air.  Small free fluid in the pelvis. Hepatobiliary: The liver is unremarkable. No biliary dilatation. The gallbladder is unremarkable. Pancreas: Unremarkable. No pancreatic ductal dilatation or surrounding inflammatory changes. Spleen: Normal in size without focal abnormality. Adrenals/Urinary Tract: The adrenal glands are unremarkable. Small nonobstructing left renal inferior pole calculi measure up to 3 mm. No hydronephrosis. There is no hydronephrosis or nephrolithiasis on the right. The visualized ureters and urinary bladder appear unremarkable. Stomach/Bowel: Scattered colonic diverticula. There is inflammatory changes centered at a mid descending colon diverticula consistent with acute  diverticulitis. No abscess or perforation. There is no bowel obstruction. The appendix is normal. Vascular/Lymphatic: The abdominal aorta and IVC unremarkable. No portal venous gas. There is no adenopathy. Reproductive: Hysterectomy.  No suspicious adnexal masses. Other: None Musculoskeletal: No acute or significant osseous findings. IMPRESSION: 1. Acute diverticulitis of the mid descending colon. No abscess or perforation. 2. Small nonobstructing left renal inferior pole calculi. No hydronephrosis. Electronically Signed   By: Vanetta Chou M.D.   On: 01/28/2024 11:46     Procedures   Medications Ordered in the ED  ketorolac  (TORADOL ) 15 MG/ML injection 15 mg (15 mg Intravenous Given 01/28/24 1031)  lactated ringers  bolus 1,000 mL (0 mLs Intravenous Stopped 01/28/24 1143)  fentaNYL  (SUBLIMAZE ) injection 50 mcg (50 mcg Intravenous Given 01/28/24 1145)  levofloxacin  (LEVAQUIN ) IVPB 750 mg (0 mg Intravenous Stopped 01/28/24 1500)  metroNIDAZOLE  (FLAGYL ) IVPB 500 mg (0 mg Intravenous Stopped 01/28/24 1323)  sodium chloride  0.9 % bolus 500 mL (0 mLs Intravenous Stopped 01/28/24 1326)  oxyCODONE  (Oxy IR/ROXICODONE ) immediate release tablet 5  mg (5 mg Oral Given 01/28/24 1224)  ondansetron  (ZOFRAN ) injection 4 mg (4 mg Intravenous Given 01/28/24 1503)                                    Medical Decision Making Amount and/or Complexity of Data Reviewed Labs: ordered. Radiology: ordered.  Risk Prescription drug management.     Differential diagnosis includes but is not limited to acute cholecystitis, cholelithiasis, cholangitis, choledocholithiasis, peptic ulcer, gastritis, gastroenteritis, appendicitis, IBS, IBD, DKA, nephrolithiasis, UTI, pyelonephritis, pancreatitis, diverticulitis, mesenteric ischemia, abdominal aortic aneurysm, small bowel obstruction, volvulus, pregnancy related concerns in females of childbearing age    ED Course:  Upon initial evaluation, patient is  well-appearing, no acute distress.  Normal vital signs aside from tachycardia to 113.  She has some mild epigastric abdominal tenderness to palpation without rebound or guarding.  Does have left-sided CVA tenderness on exam, no right sided CVA tenderness.  No nausea or vomiting currently.  Labs Ordered: I Ordered, and personally interpreted labs.  The pertinent results include:   CBC with leukocytosis of 11.4 CMP without elevations in LFTs, creatinine, or T. bili. Lipase elevated at 92 Urinalysis with ketones and bacteria noted on the reflex.  No red blood cells or white blood cells noted.  Negative for nitrates Pregnancy test negative  Imaging Studies ordered: I ordered imaging studies including CT renal I independently visualized the imaging with scope of interpretation limited to determining acute life threatening conditions related to emergency care. Imaging showed  IMPRESSION:  1. Acute diverticulitis of the mid descending colon. No abscess or  perforation.  2. Small nonobstructing left renal inferior pole calculi. No  hydronephrosis.   I agree with the radiologist interpretation   Medications Given: LR bolus 500 mL NS Fentanyl  Toradol  Oxycodone  Metronidazole  Levofloxacin   Upon re-evaluation, patient remains well-appearing with stable vitals.  Pain well-controlled with medications given.  Discussed with patient that her CT scan revealed diverticulitis without evidence of abscess or perforation.  This would explain patient's abdominal pain and the leukocytosis found on labs today.  She does have a leukocytosis, no fever, tachycardia, or other signs of systemic infection, no concern for sepsis at this time.  Lactic acid within normal limits.  Will give one-time dose of levofloxacin  and Flagyl  here for her diverticulitis.  Per up-to-date guidelines, no indication for outpatient antibiotics.  Patient p.o. challenged with water and tolerates well. Urinalysis negative for nitrates and  leukocytes.  Urinalysis did have bacteria on the reflex, and this was sent off for culture.  However, patient denying any urinary symptoms, doubt acute cystitis or pyelonephritis.  No signs of nephrolithiasis ureterolithiasis, or other acute intra-abdominal pathology to explain patient's pain.  Lipase elevated on patient's lab at 92, but no CT evidence of pancreatitis.  Patient stable and appropriate for discharge home  Impression: Diverticulitis  Disposition:  The patient was discharged home with instructions to continue on clear liquid diet at home for the next 2 to 3 days, then advance diet slowly as tolerated.  Use Tylenol  and ibuprofen at home for pain.  Oxycodone  for breakthrough pain.  She understands that a colonoscopy may be needed in about 6 to 8 weeks after resolution of symptoms.  I recommended she follow-up with her PCP within the next week for recheck of symptoms and to schedule/get referral for possible colonoscopy. Return precautions given and patient verbalized understanding.     This chart was dictated  using voice recognition software, Dragon. Despite the best efforts of this provider to proofread and correct errors, errors may still occur which can change documentation meaning.       Final diagnoses:  Diverticulitis    ED Discharge Orders          Ordered    oxyCODONE  (ROXICODONE ) 5 MG immediate release tablet  Every 6 hours PRN        01/28/24 1453    ondansetron  (ZOFRAN -ODT) 4 MG disintegrating tablet  Every 8 hours PRN        01/28/24 1501               Veta Palma, PA-C 01/28/24 1507  "

## 2024-01-28 NOTE — ED Notes (Signed)
 Patient is being discharged from the Urgent Care and sent to the Emergency Department via POV . Per Ozell Ragan,NP patient is in need of higher level of care due to further evaluation. Patient is aware and verbalizes understanding of plan of care.  Vitals:   01/28/24 0828  BP: 120/65  Pulse: (!) 121  Resp: 18  Temp: 99.5 F (37.5 C)  SpO2: 96%

## 2024-01-28 NOTE — ED Triage Notes (Signed)
 Reports L sided flank pain, dysuria since yesterday. Generalized abd cramping, nausea, diarrhea since Friday.

## 2024-01-28 NOTE — ED Notes (Signed)
 Report received from [place], [place]. Assuming patient care at this time.

## 2024-01-28 NOTE — ED Triage Notes (Signed)
 Patient c/o left sided flank pain since yesterday, pain comes and goes.  Having abdominal cramping, had some diarrhea after eating sushi on Friday, some urinary discomfort.  Denies hematuria.  History of ks.  Patient has taken Tylenol  w/o relief.

## 2024-01-28 NOTE — ED Provider Notes (Signed)
 " Michele Cohen    CSN: [id number] Arrival date & time: 01/28/24  0807      History   Chief Complaint Chief Complaint  Patient presents with   Flank Pain    HPI Michele Cohen is a 43 y.o. female.   HPI 43 year old female presents with left-sided flank pain and dysuria comes and goes.  Additionally patient is having abdominal cramping and diarrhea after eating sushi 2 days ago.  PMH significant for PE, pancreatitis, and fibromyalgia.  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   BRBPR (bright red blood per rectum) 03/13/2017   Dysuria 03/13/2017   Headache 03/13/2017   Pulmonary embolus (HCC) 03/13/2017   S/P hysterectomy 03/13/2017   Septic thrombophlebitis 03/13/2017   Shortness of breath 03/13/2017   Vaginitis 03/13/2017   Breast lump 07/20/2015   Dense breasts 07/20/2015   Family history of breast cancer 07/20/2015    Past Surgical History:  Procedure Laterality Date   ABDOMINAL HYSTERECTOMY     BREAST BIOPSY Bilateral 06/2021   BREAST CYST EXCISION     COLONOSCOPY  04/14/2020   2020 and 2005   fallopian tube      removal    UPPER GASTROINTESTINAL ENDOSCOPY  04/14/2020   2006    OB History   No obstetric history on file.      Home Medications    Prior to Admission medications  Medication Sig Start Date End Date Taking? Authorizing Provider  atorvastatin (LIPITOR) 40 MG tablet Take 40 mg by mouth daily. 05/13/23  Yes [provider]  FLUoxetine (PROZAC) 10 MG tablet Take 10 mg by mouth  daily. 11/09/23  Yes [provider]  Azelastine -Fluticasone  137-50 MCG/ACT SUSP Place 1 puff into the nose in the morning and at bedtime. 11/28/23   Maranda Jamee Jacob, MD  cetirizine  (ZYRTEC ) 10 MG tablet Take 1 tablet (10 mg total) by mouth daily. 01/19/23   Bevin Bernice RAMAN, DO    Family History Family History  Problem Relation Age of Onset   Breast cancer Mother    Heart attack Mother    Diabetes Mother    Hypertension Mother    Heart failure Father    Diabetes Father    Hypertension Father    Breast cancer Maternal Aunt    Pancreatic cancer Maternal Grandfather    Colon cancer Paternal Grandfather 60       35s ?   Colon polyps Paternal Grandfather    Breast cancer Cousin    Esophageal cancer Neg Hx    Rectal cancer Neg Hx    Stomach cancer Neg Hx     Social History Social History[1]   Allergies   Aztreonam, Phenazopyridine, Shellfish allergy, and Zolpidem   Review of Systems Review of Systems  Genitourinary:  Positive for flank pain.     Physical Exam Triage Vital Signs ED Triage Vitals  Encounter Vitals Group     BP 01/28/24 0828 120/65  Girls Systolic BP Percentile --      Girls Diastolic BP Percentile --      Boys Systolic BP Percentile --      Boys Diastolic BP Percentile --      Pulse Rate 01/28/24 0828 (!) 121     Resp 01/28/24 0828 18     Temp 01/28/24 0828 99.5 F (37.5 C)     Temp Source 01/28/24 0828 Oral     SpO2 01/28/24 0828 96 %     Weight 01/28/24 0826 135 lb (61.2 kg)     Height 01/28/24 0826 5' 2 (1.575 m)     Head Circumference --      Peak Flow --      Pain Score 01/28/24 0826 8     Pain Loc --      Pain Education --      Exclude from Growth Chart --    No data found.  Updated Vital Signs BP 120/65 (BP Location: Right Arm)   Pulse (!) 121   Temp 99.5 F (37.5 C) (Oral)   Resp 18   Ht 5' 2 (1.575 m)   Wt 135 lb (61.2 kg)   LMP 12/15/2011   SpO2 96%   BMI 24.69 kg/m   Visual Acuity Right Eye Distance:    Left Eye Distance:   Bilateral Distance:    Right Eye Near:   Left Eye Near:    Bilateral Near:     Physical Exam Vitals and nursing note reviewed.  Constitutional:      Appearance: Normal appearance. She is ill-appearing.  HENT:     Head: Normocephalic and atraumatic.     Mouth/Throat:     Mouth: Mucous membranes are moist.     Pharynx: Oropharynx is clear.  Eyes:     Extraocular Movements: Extraocular movements intact.     Conjunctiva/sclera: Conjunctivae normal.     Pupils: Pupils are equal, round, and reactive to light.  Cardiovascular:     Rate and Rhythm: Normal rate and regular rhythm.     Heart sounds: Normal heart sounds.  Pulmonary:     Effort: Pulmonary effort is normal.     Breath sounds: Normal breath sounds. No wheezing, rhonchi or rales.  Abdominal:     Tenderness: There is left CVA tenderness.  Musculoskeletal:        General: Normal range of motion.  Skin:    General: Skin is warm and dry.  Neurological:     General: No focal deficit present.     Mental Status: She is oriented to person, place, and time. Mental status is at baseline.  Psychiatric:        Mood and Affect: Mood normal.        Behavior: Behavior normal.      UC Treatments / Results  Labs (all labs ordered are listed, but only abnormal results are displayed) Labs Reviewed  POCT URINE DIPSTICK - Abnormal; Notable for the following components:      Result Value   Ketones, POC UA trace (5) (*)    Spec Grav, UA >=1.030 (*)    Blood, UA trace-intact (*)    POC PROTEIN,UA =30 (*)    All other components within normal limits    EKG   Radiology No results found.  Procedures Procedures (including critical Cohen time)  Medications Ordered in UC Medications - No data to display  Initial Impression / Assessment and Plan / UC Course  I have reviewed the triage vital  signs and the nursing notes.  Pertinent labs & imaging results that were available during my Cohen of the patient  were reviewed by me and considered in my medical decision making (see chart for details).     MDM: 1.  Left flank pain-Advised patient go to [facility] ED now for further evaluation of left flank pain with possible kidney stones.  Patient agreed and verbalized understanding of these instructions and this plan of Cohen this morning. 2.  Fever, unspecified-patient advised may take OTC Tylenol  1000 mg every 6 hours for fever (oral temperature greater than 100.3); 3.  Hematuria unspecified site-UA revealed above, patient advised prior to discharge.  Patient discharged to ED hemodynamically stable. Final Clinical Impressions(s) / UC Diagnoses   Final diagnoses:  Left flank pain  Fever, unspecified  Hematuria, unspecified type     Discharge Instructions      Advised patient go to [facility] ED now for further evaluation of left flank pain with possible kidney stones.     ED Prescriptions   None    PDMP not reviewed this encounter.    [1]  Social History Tobacco Use   Smoking status: Never   Smokeless tobacco: Never  Vaping Use   Vaping status: Never Used  Substance Use Topics   Alcohol use: No    Comment: occasional   Drug use: Never     Teddy Sharper, FNP 01/28/24 0859  "

## 2024-01-28 NOTE — Discharge Instructions (Addendum)
 You were found to have inflammation of the diverticula of your colon which we called diverticulitis.  Please continue with a clear liquid diet (clear broths, clear fruit juices without pulp) for the next 2 to 3 days, then advance your diet very slowly with bland foods such as rice, toast, applesauce.  You may take up to 1000mg  of tylenol  every 6 hours as needed for pain.  Do not take more then 4g per day.  You may use up to 600mg  ibuprofen every 6 hours as needed for pain.  Do not exceed 2.4g of ibuprofen per day.  You were given a dose of a similar medication here today.  Your next dose can be at 4 PM  You have been prescribed Oxycodone -this is a narcotic/controlled substance medication that has potential addicting qualities.  You may take 1 tablet every 6 hours as needed for severe pain.  Do not drive or operate heavy machinery when taking this medicine as it can be sedating. Do not drink alcohol or take other sedating medications when taking this medicine for safety reasons.  Keep this out of reach of small children.    You have been prescribed Zofran  (ondansetron ) for nausea and vomiting. You may take this every 8 hours as needed for nausea and vomiting. This medication dissolves under the tongue. You do not need to swallow it.    Please schedule follow-up appointment with your PCP within the next week for recheck of your symptoms.  Sometimes, a colonoscopy is recommended 6 to 8 weeks after symptoms resolve to ensure no underlying bowel pathology.  Please discuss this with your PCP.  They would be able to refer you to a GI provider to get this done if needed.  Please return to the ER if you develop persistent vomiting, fevers, severe worsening of abdominal pain, any other new or concerning symptoms

## 2024-01-28 NOTE — Discharge Instructions (Addendum)
 Advised patient go to [facility] ED now for further evaluation of left flank pain with possible kidney stones.

## 2024-01-29 LAB — URINE CULTURE: Culture: NO GROWTH
